# Patient Record
Sex: Male | Born: 1965 | Race: White | Hispanic: No | Marital: Single | Smoking: Current every day smoker
Health system: Southern US, Community
[De-identification: ages and names within clinical notes are randomized; demographics above are authoritative.]

## PROBLEM LIST (undated history)

## (undated) DIAGNOSIS — J189 Pneumonia, unspecified organism: Secondary | ICD-10-CM

## (undated) DIAGNOSIS — Z789 Other specified health status: Secondary | ICD-10-CM

## (undated) DIAGNOSIS — K219 Gastro-esophageal reflux disease without esophagitis: Secondary | ICD-10-CM

## (undated) DIAGNOSIS — J45909 Unspecified asthma, uncomplicated: Secondary | ICD-10-CM

## (undated) DIAGNOSIS — M5412 Radiculopathy, cervical region: Secondary | ICD-10-CM

## (undated) DIAGNOSIS — F1090 Alcohol use, unspecified, uncomplicated: Secondary | ICD-10-CM

## (undated) DIAGNOSIS — G8929 Other chronic pain: Secondary | ICD-10-CM

## (undated) DIAGNOSIS — L409 Psoriasis, unspecified: Secondary | ICD-10-CM

## (undated) DIAGNOSIS — IMO0001 Reserved for inherently not codable concepts without codable children: Secondary | ICD-10-CM

## (undated) DIAGNOSIS — I10 Essential (primary) hypertension: Secondary | ICD-10-CM

## (undated) DIAGNOSIS — M542 Cervicalgia: Secondary | ICD-10-CM

## (undated) DIAGNOSIS — Z7289 Other problems related to lifestyle: Secondary | ICD-10-CM

## (undated) DIAGNOSIS — J449 Chronic obstructive pulmonary disease, unspecified: Secondary | ICD-10-CM

## (undated) DIAGNOSIS — M2550 Pain in unspecified joint: Secondary | ICD-10-CM

## (undated) HISTORY — PX: MULTIPLE TOOTH EXTRACTIONS: SHX2053

## (undated) HISTORY — PX: HAND TENDON SURGERY: SHX663

## (undated) HISTORY — PX: THUMB AMPUTATION: SHX804

---

## 2000-08-27 ENCOUNTER — Emergency Department (HOSPITAL_COMMUNITY): Admission: EM | Admit: 2000-08-27 | Discharge: 2000-08-27 | Payer: Self-pay | Admitting: Internal Medicine

## 2000-08-27 ENCOUNTER — Encounter: Payer: Self-pay | Admitting: Internal Medicine

## 2001-10-09 ENCOUNTER — Emergency Department (HOSPITAL_COMMUNITY): Admission: EM | Admit: 2001-10-09 | Discharge: 2001-10-09 | Payer: Self-pay | Admitting: Emergency Medicine

## 2001-10-09 ENCOUNTER — Encounter: Payer: Self-pay | Admitting: Emergency Medicine

## 2002-01-10 ENCOUNTER — Ambulatory Visit (HOSPITAL_COMMUNITY): Admission: RE | Admit: 2002-01-10 | Discharge: 2002-01-10 | Payer: Self-pay | Admitting: Pulmonary Disease

## 2002-05-04 ENCOUNTER — Emergency Department (HOSPITAL_COMMUNITY): Admission: EM | Admit: 2002-05-04 | Discharge: 2002-05-04 | Payer: Self-pay | Admitting: Emergency Medicine

## 2002-07-28 ENCOUNTER — Emergency Department (HOSPITAL_COMMUNITY): Admission: EM | Admit: 2002-07-28 | Discharge: 2002-07-28 | Payer: Self-pay | Admitting: Emergency Medicine

## 2002-08-27 ENCOUNTER — Emergency Department (HOSPITAL_COMMUNITY): Admission: EM | Admit: 2002-08-27 | Discharge: 2002-08-27 | Payer: Self-pay | Admitting: Emergency Medicine

## 2004-01-07 ENCOUNTER — Emergency Department (HOSPITAL_COMMUNITY): Admission: EM | Admit: 2004-01-07 | Discharge: 2004-01-07 | Payer: Self-pay | Admitting: Emergency Medicine

## 2004-08-01 ENCOUNTER — Emergency Department (HOSPITAL_COMMUNITY): Admission: EM | Admit: 2004-08-01 | Discharge: 2004-08-01 | Payer: Self-pay | Admitting: Emergency Medicine

## 2005-09-11 ENCOUNTER — Emergency Department (HOSPITAL_COMMUNITY): Admission: EM | Admit: 2005-09-11 | Discharge: 2005-09-11 | Payer: Self-pay | Admitting: Emergency Medicine

## 2006-02-07 ENCOUNTER — Emergency Department (HOSPITAL_COMMUNITY): Admission: EM | Admit: 2006-02-07 | Discharge: 2006-02-07 | Payer: Self-pay | Admitting: Emergency Medicine

## 2006-03-19 ENCOUNTER — Emergency Department (HOSPITAL_COMMUNITY): Admission: EM | Admit: 2006-03-19 | Discharge: 2006-03-19 | Payer: Self-pay | Admitting: Emergency Medicine

## 2006-12-24 ENCOUNTER — Emergency Department (HOSPITAL_COMMUNITY): Admission: EM | Admit: 2006-12-24 | Discharge: 2006-12-24 | Payer: Self-pay | Admitting: Emergency Medicine

## 2009-03-16 ENCOUNTER — Emergency Department (HOSPITAL_COMMUNITY): Admission: EM | Admit: 2009-03-16 | Discharge: 2009-03-16 | Payer: Self-pay | Admitting: Emergency Medicine

## 2009-07-21 ENCOUNTER — Emergency Department (HOSPITAL_COMMUNITY): Admission: EM | Admit: 2009-07-21 | Discharge: 2009-07-21 | Payer: Self-pay | Admitting: Emergency Medicine

## 2010-09-25 ENCOUNTER — Emergency Department (HOSPITAL_COMMUNITY): Payer: Self-pay

## 2010-09-25 ENCOUNTER — Encounter: Payer: Self-pay | Admitting: Emergency Medicine

## 2010-09-25 ENCOUNTER — Emergency Department (HOSPITAL_COMMUNITY)
Admission: EM | Admit: 2010-09-25 | Discharge: 2010-09-25 | Disposition: A | Discharge status: Home / Self Care | Payer: Self-pay | Attending: Emergency Medicine | Admitting: Emergency Medicine

## 2010-09-25 DIAGNOSIS — F17200 Nicotine dependence, unspecified, uncomplicated: Secondary | ICD-10-CM | POA: Insufficient documentation

## 2010-09-25 DIAGNOSIS — S9030XA Contusion of unspecified foot, initial encounter: Secondary | ICD-10-CM | POA: Insufficient documentation

## 2010-09-25 DIAGNOSIS — IMO0002 Reserved for concepts with insufficient information to code with codable children: Secondary | ICD-10-CM

## 2010-09-25 DIAGNOSIS — W208XXA Other cause of strike by thrown, projected or falling object, initial encounter: Secondary | ICD-10-CM | POA: Insufficient documentation

## 2010-09-25 DIAGNOSIS — Y92009 Unspecified place in unspecified non-institutional (private) residence as the place of occurrence of the external cause: Secondary | ICD-10-CM | POA: Insufficient documentation

## 2010-09-25 NOTE — ED Notes (Signed)
Patient with no complaints at this time. Respirations even and unlabored. Skin warm/dry. Discharge instructions reviewed with patient at this time. Patient given opportunity to voice concerns/ask questions. Patient discharged at this time and left Emergency Department with steady gait.   

## 2010-09-25 NOTE — ED Notes (Signed)
Patient reports injury to left foot. Patient reports a TV fell on his foot. Swelling and bruising noted to left anterior foot. PMS intact. Cap refill <3 seconds.

## 2010-09-25 NOTE — ED Notes (Signed)
Patient reports taking 800 mg of ibuprofen 0900 today for pain relief.

## 2010-09-25 NOTE — ED Provider Notes (Signed)
History     CSN: [id number] Arrival date & time: 09/25/2010 12:19 PM  Chief Complaint  Patient presents with  . Foot Injury   Patient is a 45 y.o. male presenting with foot injury. The history is provided by the patient. No language interpreter was used.  Foot Injury  The incident occurred yesterday. The incident occurred at home (helping a friend move a TV and it dropped on his foot ). The injury mechanism was a direct blow. The pain is present in the left foot. The pain is at a severity of 6/10.    History reviewed. No pertinent past medical history.  Past Surgical History  Procedure Date  . Hand tendon surgery   . Thumb amputation     Partial     History reviewed. No pertinent family history.  History  Substance Use Topics  . Smoking status: Current Everyday Smoker -- 3.0 packs/day    Types: Cigarettes  . Smokeless tobacco: Not on file  . Alcohol Use: 10.8 oz/week    18 Cans of beer per week      Review of Systems  Musculoskeletal: Back pain: trauma.       L foot injury  All other systems reviewed and are negative.    Physical Exam  BP 157/108  Pulse 76  Temp(Src) 97.8 F (36.6 C) (Oral)  Resp 16  Ht 5\' 6"  (1.676 m)  Wt 145 lb (65.772 kg)  BMI 23.40 kg/m2  SpO2 98%  Physical Exam  Nursing note and vitals reviewed. Constitutional: He is oriented to person, place, and time. Vital signs are normal. He appears well-developed and well-nourished. No distress.  HENT:  Head: Normocephalic and atraumatic.  Right Ear: External ear normal.  Left Ear: External ear normal.  Nose: Nose normal.  Mouth/Throat: No oropharyngeal exudate.  Eyes: Conjunctivae and EOM are normal. Pupils are equal, round, and reactive to light. Right eye exhibits no discharge. Left eye exhibits no discharge. No scleral icterus.  Neck: Normal range of motion. Neck supple. No JVD present. No tracheal deviation present. No thyromegaly present.  Cardiovascular: Normal rate, regular rhythm,  normal heart sounds, intact distal pulses and normal pulses.  Exam reveals no gallop and no friction rub.   No murmur heard. Pulmonary/Chest: Effort normal and breath sounds normal. No stridor. No respiratory distress. He has no wheezes. He has no rales. He exhibits no tenderness.  Abdominal: Soft. Normal appearance and bowel sounds are normal. He exhibits no distension and no mass. There is no tenderness. There is no rebound and no guarding.  Musculoskeletal: Normal range of motion. He exhibits no edema and no tenderness.       Feet:  Lymphadenopathy:    He has no cervical adenopathy.  Neurological: He is alert and oriented to person, place, and time. He has normal reflexes. No cranial nerve deficit. Coordination normal. GCS eye subscore is 4. GCS verbal subscore is 5. GCS motor subscore is 6.  Reflex Scores:      Tricep reflexes are 2+ on the right side and 2+ on the left side.      Bicep reflexes are 2+ on the right side and 2+ on the left side.      Brachioradialis reflexes are 2+ on the right side and 2+ on the left side.      Patellar reflexes are 2+ on the right side and 2+ on the left side.      Achilles reflexes are 2+ on the right side and 2+ on  the left side. Skin: Skin is warm and dry. No rash noted. He is not diaphoretic.  Psychiatric: He has a normal mood and affect. His speech is normal and behavior is normal. Judgment and thought content normal. Cognition and memory are normal.    ED Course  Procedures  MDM       Worthy Rancher, PA 09/25/10 1511  Worthy Rancher, PA 09/25/10 1512  Worthy Rancher, PA 09/25/10 1519  Worthy Rancher, PA 09/25/10 1519  Worthy Rancher, PA 09/25/10 1524

## 2010-09-28 NOTE — ED Provider Notes (Signed)
Medical screening examination/treatment/procedure(s) were performed by non-physician practitioner and as supervising physician I was immediately available for consultation/collaboration.   Geoffery Lyons, MD 09/28/10 [phone]

## 2011-05-02 ENCOUNTER — Emergency Department (HOSPITAL_COMMUNITY): Payer: Worker's Compensation

## 2011-05-02 ENCOUNTER — Emergency Department (HOSPITAL_COMMUNITY)
Admission: EM | Admit: 2011-05-02 | Discharge: 2011-05-02 | Disposition: A | Discharge status: Home / Self Care | Payer: Worker's Compensation | Attending: Emergency Medicine | Admitting: Emergency Medicine

## 2011-05-02 ENCOUNTER — Encounter (HOSPITAL_COMMUNITY): Payer: Self-pay | Admitting: *Deleted

## 2011-05-02 DIAGNOSIS — R079 Chest pain, unspecified: Secondary | ICD-10-CM | POA: Insufficient documentation

## 2011-05-02 DIAGNOSIS — S20219A Contusion of unspecified front wall of thorax, initial encounter: Secondary | ICD-10-CM | POA: Insufficient documentation

## 2011-05-02 DIAGNOSIS — W19XXXA Unspecified fall, initial encounter: Secondary | ICD-10-CM | POA: Insufficient documentation

## 2011-05-02 MED ORDER — OXYCODONE-ACETAMINOPHEN 5-325 MG PO TABS: 1.0000 | ORAL_TABLET | Freq: Four times a day (QID) | ORAL | Status: AC | PRN

## 2011-05-02 MED ORDER — OXYCODONE-ACETAMINOPHEN 5-325 MG PO TABS
ORAL_TABLET | ORAL | Status: AC
  Filled 2011-05-02: qty 1

## 2011-05-02 MED ORDER — OXYCODONE-ACETAMINOPHEN 5-325 MG PO TABS
2.0000 | ORAL_TABLET | Freq: Once | ORAL | Status: AC
  Administered 2011-05-02: 2 via ORAL
  Filled 2011-05-02: qty 1

## 2011-05-02 MED ORDER — NAPROXEN 500 MG PO TABS: 500.0000 mg | ORAL_TABLET | Freq: Two times a day (BID) | ORAL | Status: DC

## 2011-05-02 MED ORDER — IBUPROFEN 800 MG PO TABS
800.0000 mg | ORAL_TABLET | Freq: Once | ORAL | Status: AC
  Administered 2011-05-02: 800 mg via ORAL
  Filled 2011-05-02: qty 1

## 2011-05-02 NOTE — ED Notes (Signed)
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

## 2011-05-02 NOTE — Discharge Instructions (Signed)
Chest Contusion You have been checked for injuries to your chest. Your caregiver has not found injuries serious enough to require hospitalization. It is common to have bruises and sore muscles after an injury. These tend to feel worse the first 24 hours. You may gradually develop more stiffness and soreness over the next several hours to several days. This usually feels worse the first morning following your injury. After a few days, you will usually begin to improve. The amount of improvement depends on the amount of damage. Following the accident, if the pain in any area continues to increase or you develop new areas of pain, you should see your primary caregiver or return to the Emergency Department for re-evaluation. HOME CARE INSTRUCTIONS   Put ice on sore areas every 2 hours for 20 minutes while awake for the next 2 days.   Drink extra fluids. Do not drink alcohol.   Activity as tolerated. Lifting may make pain worse.   Only take over-the-counter or prescription medicines for pain, discomfort, or fever as directed by your caregiver. Do not use aspirin. This may increase bruising or increase bleeding.  SEEK IMMEDIATE MEDICAL CARE IF:   There is a worsening of any of the problems that brought you in for care.   Shortness of breath, dizziness or fainting develop.   You have chest pain, difficulty breathing, or develop pain going down the left arm or up into jaw.   You feel sick to your stomach (nausea), vomiting or sweats.   You have increasing belly (abdominal) discomfort.   There is blood in your urine, stool, or if you vomit blood.   There is pain in either shoulder in an area where a shoulder strap would be.   You have feelings of lightheadedness, or if you should have a fainting episode.   You have numbness, tingling, weakness, or problems with the use of your arms or legs.   Severe headaches not relieved with medications develop.   You have a change in bowel or bladder  control.   There is increasing pain in any areas of the body.  If you feel your symptoms are worsening, and you are not able to see your primary caregiver, return to the Emergency Department immediately. MAKE SURE YOU:   Understand these instructions.   Will watch your condition.   Will get help right away if you are not doing well or get worse.  Document Released: 11/02/2000 Document Revised: 01/27/2011 Document Reviewed: 09/26/2007 [employer] Patient Information 2012 [employer]. 

## 2011-05-02 NOTE — ED Notes (Signed)
Reports fell at work on Friday landing on right side.  C/o right sided rib pain, c/o increased pain with deep breath.

## 2011-05-02 NOTE — ED Provider Notes (Signed)
History   This chart was scribed for Shane Bailiff, MD by Clarita Crane. The patient was seen in room APA09/APA09. Patient's care was started at 0828.    CSN: [id number]  Arrival date & time 05/02/11  1191   First MD Initiated Contact with Patient 05/02/11 [phone]      Chief Complaint  Patient presents with  . Fall    (Consider location/radiation/quality/duration/timing/severity/associated sxs/prior treatment) HPI Shane Allison is a 46 y.o. male who presents to the Emergency Department complaining of constant moderate to severe right lateral rib pain onset 3 days ago after sustaining a fall while at work in which he landed on his right side/ribs and persistent since. States pain is aggravated with deep breathing. Denies fever, nausea, vomiting, SOB, cough. Patient is a current everyday smoker.   History reviewed. No pertinent past medical history.  Past Surgical History  Procedure Date  . Hand tendon surgery   . Thumb amputation     Partial     No family history on file.  History  Substance Use Topics  . Smoking status: Current Everyday Smoker -- 3.0 packs/day    Types: Cigarettes  . Smokeless tobacco: Not on file  . Alcohol Use: 10.8 oz/week    18 Cans of beer per week      Review of Systems  Constitutional: Negative for fever and chills.  HENT: Negative for rhinorrhea and neck pain.   Eyes: Negative for pain.  Respiratory: Negative for cough and shortness of breath.        Right sided chest wall pain.   Cardiovascular: Negative for chest pain.  Gastrointestinal: Negative for nausea, vomiting, abdominal pain and diarrhea.  Genitourinary: Negative for dysuria.  Musculoskeletal: Negative for back pain.  Skin: Negative for rash.  Neurological: Negative for dizziness and weakness.    Allergies  Penicillins  Home Medications   Current Outpatient Rx  Name Route Sig Dispense Refill  . NAPROXEN 500 MG PO TABS Oral Take 1 tablet (500 mg total) by mouth 2 (two)  times daily. 30 tablet 0  . OXYCODONE-ACETAMINOPHEN 5-325 MG PO TABS Oral Take 1-2 tablets by mouth every 6 (six) hours as needed for pain. 20 tablet 0    BP 165/88  Pulse 91  Temp(Src) 98.1 F (36.7 C) (Oral)  Resp 16  Ht 5\' 6"  (1.676 m)  Wt 150 lb (68.04 kg)  BMI 24.21 kg/m2  SpO2 96%  Physical Exam  Nursing note and vitals reviewed. Constitutional: He is oriented to person, place, and time. He appears well-developed and well-nourished. No distress.  HENT:  Head: Normocephalic and atraumatic.  Eyes: EOM are normal. Pupils are equal, round, and reactive to light.  Neck: Neck supple. No tracheal deviation present.  Cardiovascular: Normal rate.   Pulmonary/Chest: Effort normal and breath sounds normal. No respiratory distress. He has no wheezes. He has no rales.       Right sided chest wall tender to palpation.   Abdominal: Soft. He exhibits no distension.  Musculoskeletal: Normal range of motion. He exhibits no edema.  Neurological: He is alert and oriented to person, place, and time. No sensory deficit.  Skin: Skin is warm and dry.  Psychiatric: He has a normal mood and affect. His behavior is normal.    ED Course  Procedures (including critical care time)  DIAGNOSTIC STUDIES: Oxygen Saturation is 96% on room air, adequate by my interpretation.    COORDINATION OF CARE: 9:23AM- Patient informed of current plan for treatment and evaluation and  agrees with plan at this time.     Labs Reviewed - No data to display Dg Ribs Unilateral W/chest Right  05/02/2011  *RADIOLOGY REPORT*  Clinical Data: Right anterior rib pain after fall.  RIGHT RIBS AND CHEST - 3+ VIEW  Comparison: Chest x-ray from 07/21/2009  Findings: Frontal chest shows clear lungs. The cardiopericardial silhouette is within normal limits for size.  Oblique views of the right ribs were obtained with a radiopaque BB localizing the area of the patient's concern.  No underlying rib fracture is evident.  IMPRESSION: No  acute cardiopulmonary findings.  No evidence for acute right rib fracture.  Incidental note made of right cervical rib.  Original Report Authenticated By: ERIC A. MANSELL, M.D.     1. Rib contusion       MDM  Imaging negative for rib fracture or pneumothorax. Patient was prescribed pain medication, anti-inflammatory medication. He was prescribed an incentive spirometer which he was instructed easily 3 times an hour to prevent atelectatic pneumonia. He is provided clear signs and symptoms for which returned emergency department.      I personally performed the services described in this documentation, which was scribed in my presence. The recorded information has been reviewed and considered.    Shane Bailiff, MD 05/02/11 [phone]

## 2012-01-22 ENCOUNTER — Emergency Department (HOSPITAL_COMMUNITY)
Admission: EM | Admit: 2012-01-22 | Discharge: 2012-01-22 | Disposition: A | Discharge status: Home / Self Care | Payer: Self-pay | Attending: Emergency Medicine | Admitting: Emergency Medicine

## 2012-01-22 ENCOUNTER — Emergency Department (HOSPITAL_COMMUNITY): Payer: Self-pay

## 2012-01-22 ENCOUNTER — Encounter (HOSPITAL_COMMUNITY): Payer: Self-pay

## 2012-01-22 DIAGNOSIS — IMO0001 Reserved for inherently not codable concepts without codable children: Secondary | ICD-10-CM | POA: Insufficient documentation

## 2012-01-22 DIAGNOSIS — Z872 Personal history of diseases of the skin and subcutaneous tissue: Secondary | ICD-10-CM | POA: Insufficient documentation

## 2012-01-22 DIAGNOSIS — R0789 Other chest pain: Secondary | ICD-10-CM | POA: Insufficient documentation

## 2012-01-22 DIAGNOSIS — J069 Acute upper respiratory infection, unspecified: Secondary | ICD-10-CM | POA: Insufficient documentation

## 2012-01-22 DIAGNOSIS — R062 Wheezing: Secondary | ICD-10-CM | POA: Insufficient documentation

## 2012-01-22 DIAGNOSIS — F17200 Nicotine dependence, unspecified, uncomplicated: Secondary | ICD-10-CM | POA: Insufficient documentation

## 2012-01-22 DIAGNOSIS — J3489 Other specified disorders of nose and nasal sinuses: Secondary | ICD-10-CM | POA: Insufficient documentation

## 2012-01-22 DIAGNOSIS — J40 Bronchitis, not specified as acute or chronic: Secondary | ICD-10-CM | POA: Insufficient documentation

## 2012-01-22 DIAGNOSIS — I10 Essential (primary) hypertension: Secondary | ICD-10-CM | POA: Insufficient documentation

## 2012-01-22 HISTORY — DX: Essential (primary) hypertension: I10

## 2012-01-22 HISTORY — DX: Psoriasis, unspecified: L40.9

## 2012-01-22 MED ORDER — PREDNISONE 50 MG PO TABS
60.0000 mg | ORAL_TABLET | Freq: Once | ORAL | Status: AC
  Administered 2012-01-22: 60 mg via ORAL
  Filled 2012-01-22: qty 1

## 2012-01-22 MED ORDER — ALBUTEROL SULFATE (5 MG/ML) 0.5% IN NEBU
5.0000 mg | INHALATION_SOLUTION | Freq: Once | RESPIRATORY_TRACT | Status: AC
  Administered 2012-01-22: 5 mg via RESPIRATORY_TRACT
  Filled 2012-01-22: qty 1

## 2012-01-22 MED ORDER — AZITHROMYCIN 250 MG PO TABS: ORAL_TABLET | ORAL | Status: DC

## 2012-01-22 MED ORDER — GUAIFENESIN-CODEINE 100-10 MG/5ML PO SYRP: 10.0000 mL | ORAL_SOLUTION | Freq: Three times a day (TID) | ORAL | Status: AC | PRN

## 2012-01-22 MED ORDER — AZITHROMYCIN 250 MG PO TABS
500.0000 mg | ORAL_TABLET | Freq: Once | ORAL | Status: AC
  Administered 2012-01-22: 500 mg via ORAL
  Filled 2012-01-22: qty 2

## 2012-01-22 MED ORDER — IPRATROPIUM BROMIDE 0.02 % IN SOLN
0.5000 mg | Freq: Once | RESPIRATORY_TRACT | Status: AC
  Administered 2012-01-22: 0.5 mg via RESPIRATORY_TRACT
  Filled 2012-01-22: qty 2.5

## 2012-01-22 MED ORDER — ALBUTEROL SULFATE HFA 108 (90 BASE) MCG/ACT IN AERS
2.0000 | INHALATION_SPRAY | Freq: Once | RESPIRATORY_TRACT | Status: AC
  Administered 2012-01-22: 2 via RESPIRATORY_TRACT
  Filled 2012-01-22: qty 6.7

## 2012-01-22 MED ORDER — PREDNISONE 20 MG PO TABS: ORAL_TABLET | ORAL | Status: DC

## 2012-01-22 NOTE — ED Notes (Signed)
Pt c/o productive cough with green sputum since Monday.  Denies any fever.

## 2012-01-22 NOTE — ED Notes (Signed)
Pt c/o productive cough and rib pain x1 week. Pt states mucus is green.

## 2012-01-22 NOTE — ED Provider Notes (Signed)
History     CSN: [id number]  Arrival date & time 01/22/12  1330   First MD Initiated Contact with Patient 01/22/12 1447      Chief Complaint  Patient presents with  . URI    (Consider location/radiation/quality/duration/timing/severity/associated sxs/prior treatment) HPI Comments: Patient c/o persistent productive cough for one week.  States the cough is productive of yellow to green sputum.  Also c/o chest tightness with cough and deep breathing.  He also c/o myalgias and nasal congestion Patient is a smoker and states he has had pneumonia multiple times in the past.  He denies fever, vomiting, shortness of breath, or abdominal pain.  He denies any OTC medications  Patient is a 46 y.o. male presenting with cough. The history is provided by the patient.  Cough This is a new problem. The current episode started more than 1 week ago. The problem occurs constantly. The problem has not changed since onset.The cough is productive of purulent sputum. There has been no fever. Associated symptoms include rhinorrhea, myalgias and wheezing. Pertinent negatives include no chest pain, no chills, no sweats, no headaches, no sore throat and no shortness of breath. He has tried nothing for the symptoms. The treatment provided no relief. He is a smoker. His past medical history is significant for pneumonia. His past medical history does not include COPD.    Past Medical History  Diagnosis Date  . Hypertension   . Psoriasis     Past Surgical History  Procedure Date  . Hand tendon surgery   . Thumb amputation     Partial     No family history on file.  History  Substance Use Topics  . Smoking status: Current Every Day Smoker -- 3.0 packs/day    Types: Cigarettes  . Smokeless tobacco: Not on file  . Alcohol Use: 0.0 oz/week     Comment: 6 pack/ day-  Last intake was last night.      Review of Systems  Constitutional: Negative for fever, chills, activity change and appetite change.    HENT: Positive for congestion and rhinorrhea. Negative for sore throat, facial swelling, trouble swallowing, neck pain and neck stiffness.   Eyes: Negative for visual disturbance.  Respiratory: Positive for cough, chest tightness and wheezing. Negative for apnea, shortness of breath and stridor.   Cardiovascular: Negative for chest pain.  Gastrointestinal: Negative for nausea, vomiting and abdominal pain.  Musculoskeletal: Positive for myalgias. Negative for arthralgias and gait problem.  Skin: Negative.  Negative for color change and rash.  Neurological: Negative for dizziness, facial asymmetry, weakness, numbness and headaches.  Hematological: Negative for adenopathy.  Psychiatric/Behavioral: Negative for confusion.  All other systems reviewed and are negative.    Allergies  Penicillins  Home Medications  No current outpatient prescriptions on file.  BP 165/98  Pulse 108  Temp 98.8 F (37.1 C) (Oral)  Resp 20  Ht 5\' 7"  (1.702 m)  Wt 145 lb (65.772 kg)  BMI 22.71 kg/m2  SpO2 98%  Physical Exam  Nursing note and vitals reviewed. Constitutional: He is oriented to person, place, and time. He appears well-developed and well-nourished. No distress.  HENT:  Head: Normocephalic and atraumatic.  Right Ear: Tympanic membrane and ear canal normal.  Left Ear: Tympanic membrane and ear canal normal.  Mouth/Throat: Uvula is midline, oropharynx is clear and moist and mucous membranes are normal. No oropharyngeal exudate.  Eyes: EOM are normal. Pupils are equal, round, and reactive to light.  Neck: Normal range of motion. Neck  supple.  Cardiovascular: Normal rate, regular rhythm, normal heart sounds and intact distal pulses.   No murmur heard. Pulmonary/Chest: Effort normal. No respiratory distress. He has wheezes. He has no rales. He exhibits no tenderness.       Inspiratory and expiratory wheezing throughout.  No rales.  Abdominal: Soft. He exhibits no distension. There is no  tenderness. There is no rebound and no guarding.  Musculoskeletal: He exhibits no edema.  Lymphadenopathy:    He has no cervical adenopathy.  Neurological: He is alert and oriented to person, place, and time. He exhibits normal muscle tone. Coordination normal.  Skin: Skin is warm and dry.    ED Course  Procedures (including critical care time)  Labs Reviewed - No data to display Dg Chest 2 View  01/22/2012  *RADIOLOGY REPORT*  Clinical Data: Cough.  Respiratory infection  CHEST - 2 VIEW  Comparison: 05/02/2011  Findings: COPD with hyperinflation of the lungs.  Negative for pneumonia.  Negative for heart failure or effusion.  Lungs are clear.  No mass lesion.  Cervical rib on the right.  IMPRESSION: COPD without acute cardiopulmonary abnormality.   Original Report Authenticated By: Janeece Riggers, M.D.         MDM    Vitals stable, pt is non-toxic appearing, no tachypnea or hypoxia.  Likely bronchitis with findings c/w COPD on x-ray.   Will treat pt with prednisone, z-pack, cough syrup and dispensed albuterol inhaler form ED.  Pt agrees to f/u with pMD or return here in 2-3 days if the sx's are not improving      Macari Zalesky L. [place], [place] 01/23/12 2100

## 2012-01-24 NOTE — ED Provider Notes (Signed)
Medical screening examination/treatment/procedure(s) were performed by non-physician practitioner and as supervising physician I was immediately available for consultation/collaboration.   Moni Rothrock, MD 01/24/12 1647 

## 2012-01-25 ENCOUNTER — Encounter (HOSPITAL_COMMUNITY): Payer: Self-pay | Admitting: *Deleted

## 2012-01-25 ENCOUNTER — Emergency Department (HOSPITAL_COMMUNITY)
Admission: EM | Admit: 2012-01-25 | Discharge: 2012-01-25 | Disposition: A | Discharge status: Home / Self Care | Payer: Self-pay | Attending: Emergency Medicine | Admitting: Emergency Medicine

## 2012-01-25 DIAGNOSIS — J209 Acute bronchitis, unspecified: Secondary | ICD-10-CM | POA: Insufficient documentation

## 2012-01-25 DIAGNOSIS — J029 Acute pharyngitis, unspecified: Secondary | ICD-10-CM | POA: Insufficient documentation

## 2012-01-25 DIAGNOSIS — Z79899 Other long term (current) drug therapy: Secondary | ICD-10-CM | POA: Insufficient documentation

## 2012-01-25 DIAGNOSIS — F17200 Nicotine dependence, unspecified, uncomplicated: Secondary | ICD-10-CM | POA: Insufficient documentation

## 2012-01-25 DIAGNOSIS — R062 Wheezing: Secondary | ICD-10-CM | POA: Insufficient documentation

## 2012-01-25 DIAGNOSIS — R0602 Shortness of breath: Secondary | ICD-10-CM | POA: Insufficient documentation

## 2012-01-25 DIAGNOSIS — R059 Cough, unspecified: Secondary | ICD-10-CM | POA: Insufficient documentation

## 2012-01-25 DIAGNOSIS — R0789 Other chest pain: Secondary | ICD-10-CM | POA: Insufficient documentation

## 2012-01-25 DIAGNOSIS — R05 Cough: Secondary | ICD-10-CM | POA: Insufficient documentation

## 2012-01-25 DIAGNOSIS — Z760 Encounter for issue of repeat prescription: Secondary | ICD-10-CM | POA: Insufficient documentation

## 2012-01-25 DIAGNOSIS — I10 Essential (primary) hypertension: Secondary | ICD-10-CM | POA: Insufficient documentation

## 2012-01-25 MED ORDER — AZITHROMYCIN 250 MG PO TABS
250.0000 mg | ORAL_TABLET | Freq: Once | ORAL | Status: AC
  Administered 2012-01-25: 250 mg via ORAL
  Filled 2012-01-25: qty 1

## 2012-01-25 MED ORDER — IPRATROPIUM BROMIDE 0.02 % IN SOLN
0.5000 mg | Freq: Once | RESPIRATORY_TRACT | Status: AC
  Administered 2012-01-25: 0.5 mg via RESPIRATORY_TRACT
  Filled 2012-01-25: qty 2.5

## 2012-01-25 MED ORDER — ALBUTEROL SULFATE (5 MG/ML) 0.5% IN NEBU
5.0000 mg | INHALATION_SOLUTION | Freq: Once | RESPIRATORY_TRACT | Status: AC
  Administered 2012-01-25: 5 mg via RESPIRATORY_TRACT
  Filled 2012-01-25: qty 1

## 2012-01-25 MED ORDER — QUINAPRIL-HYDROCHLOROTHIAZIDE 20-25 MG PO TABS: 1.0000 | ORAL_TABLET | Freq: Every day | ORAL | Status: DC

## 2012-01-25 MED ORDER — BENZONATATE 100 MG PO CAPS
200.0000 mg | ORAL_CAPSULE | Freq: Once | ORAL | Status: AC
  Administered 2012-01-25: 200 mg via ORAL
  Filled 2012-01-25: qty 2

## 2012-01-25 MED ORDER — BENZONATATE 100 MG PO CAPS: 100.0000 mg | ORAL_CAPSULE | Freq: Three times a day (TID) | ORAL | Status: DC | PRN

## 2012-01-25 NOTE — ED Provider Notes (Signed)
History     CSN: [id number]  Arrival date & time 01/25/12  1534   First MD Initiated Contact with Patient 01/25/12 1609      Chief Complaint  Patient presents with  . flu sx     (Consider location/radiation/quality/duration/timing/severity/associated sxs/prior treatment) HPI Comments: Shane Allison presents for re-evaluation of his cough,  Wheezing and now with complaint of hoarseness and sore throat which he feels is secondary to all of his coughing.  His cough was productive of green to yellow sputum which has dried up since he was placed on zithromax 3 days ago.  He took his last tablet today,  As he accidentally took a double dose 2 days ago despite being his first double dose here 3 days ago prior to dispo home,  Is therefore now one tablet short. He still has 4 days of prednisone taper to take.   He also reports having peripheral edema which he noticed yesterday after standing all day at work.  He is followed by the health dept for his blood pressure and ran out of his combo bp/diuretic medicine one week ago.  He does not recall the name.  He denies chest pain, palpitations and fevers.  He has continued to smoke cigarettes.  He denies fevers and chills, has had no chest pain,  Dizziness or weakness, although feels fatigued.  The history is provided by the patient.    Past Medical History  Diagnosis Date  . Hypertension   . Psoriasis     Past Surgical History  Procedure Date  . Hand tendon surgery   . Thumb amputation     Partial     No family history on file.  History  Substance Use Topics  . Smoking status: Current Every Day Smoker -- 3.0 packs/day    Types: Cigarettes  . Smokeless tobacco: Not on file  . Alcohol Use: 0.0 oz/week     Comment: 6 pack/ day-  Last intake was 01/21/12      Review of Systems  Constitutional: Negative for fever.  HENT: Positive for sore throat. Negative for congestion and neck pain.   Eyes: Negative.   Respiratory: Positive for  cough, chest tightness, shortness of breath and wheezing. Negative for stridor.   Cardiovascular: Negative for chest pain.  Gastrointestinal: Negative for nausea and abdominal pain.  Genitourinary: Negative.   Musculoskeletal: Negative for joint swelling and arthralgias.  Skin: Negative.  Negative for rash and wound.  Neurological: Negative for dizziness, weakness, light-headedness, numbness and headaches.  Hematological: Negative.   Psychiatric/Behavioral: Negative.     Allergies  Penicillins  Home Medications   Current Outpatient Rx  Name  Route  Sig  Dispense  Refill  . ALBUTEROL SULFATE HFA 108 (90 BASE) MCG/ACT IN AERS   Inhalation   Inhale 2 puffs into the lungs every 6 (six) hours as needed. Shortness of Breath         . VITAMIN C PO   Oral   Take 1 packet by mouth daily.         . AZITHROMYCIN 250 MG PO TABS   Oral   Take 250 mg by mouth daily. Take 2 tablets the first day then 1 tablet on days 2-5.         Marland Kitchen DIPHENHYDRAMINE-PE-APAP 12.5-5-325 MG/15ML PO LIQD   Oral   Take 15 mLs by mouth at bedtime as needed. Cold Symptoms         . GUAIFENESIN-CODEINE 100-10 MG/5ML PO SYRP  Oral   Take 10 mLs by mouth 3 (three) times daily as needed for cough.   120 mL   0   . IBUPROFEN 200 MG PO TABS   Oral   Take 200 mg by mouth every 6 (six) hours as needed. Pain         . PREDNISONE 20 MG PO TABS   Oral   Take 10 mg by mouth daily. Take 6 tablets po qd x 2 days, then 4 tablets po qd x 2 days, then 2 tablet po qd x 2 days         . QUINAPRIL-HYDROCHLOROTHIAZIDE 20-25 MG PO TABS   Oral   Take 1 tablet by mouth daily.         Marland Kitchen BENZONATATE 100 MG PO CAPS   Oral   Take 1 capsule (100 mg total) by mouth 3 (three) times daily as needed for cough.   21 capsule   0   . QUINAPRIL-HYDROCHLOROTHIAZIDE 20-25 MG PO TABS   Oral   Take 1 tablet by mouth daily.   30 tablet   0     BP 148/100  Pulse 91  Temp 98.3 F (36.8 C) (Oral)  Resp 20  Ht 5\' 7"   (1.702 m)  Wt 145 lb (65.772 kg)  BMI 22.71 kg/m2  SpO2 96%  Physical Exam  Nursing note and vitals reviewed. Constitutional: He appears well-developed and well-nourished.  HENT:  Head: Normocephalic and atraumatic.  Eyes: Conjunctivae normal are normal.  Neck: Normal range of motion.  Cardiovascular: Normal rate, regular rhythm, normal heart sounds and intact distal pulses.   Pulmonary/Chest: Effort normal. He has wheezes. He has no rales. He exhibits no tenderness.       Bilateral expiratory wheeze.  Prolonged expirations.  Abdominal: Soft. Bowel sounds are normal. There is no tenderness.  Musculoskeletal: Normal range of motion. He exhibits no edema and no tenderness.  Neurological: He is alert.  Skin: Skin is warm and dry.  Psychiatric: He has a normal mood and affect.    ED Course  Procedures (including critical care time)  Labs Reviewed - No data to display No results found.   1. Bronchitis, acute, with bronchospasm   2. Medication refill     Pt given albuterol/ atrovent neb with improvement in wheezing,  Improved aeration.  MDM  Call to [facility] dept - pt should be taking accuretic 20/25.  Prescription given.  Pt is scheduled to see his pcp in 6 days.  He was given one zithromax tablet to take tomorrow to complete his z pack prescription.  Prescribed tessalon for cough suppression.  He does have an albuterol inhaler and was encouraged to continue this tx.          Burgess Amor, PA 01/26/12 0032  Burgess Amor, PA 01/26/12 [phone]

## 2012-01-25 NOTE — ED Notes (Signed)
Seen here on Sunday and dx with COPD, bronchitis, and flu.  Reports is not getting better.  States completed prednisone and abx.

## 2012-01-27 NOTE — ED Provider Notes (Signed)
Medical screening examination/treatment/procedure(s) were performed by non-physician practitioner and as supervising physician I was immediately available for consultation/collaboration.   Joya Gaskins, MD 01/27/12 [phone]

## 2012-05-30 ENCOUNTER — Emergency Department (HOSPITAL_COMMUNITY)
Admission: EM | Admit: 2012-05-30 | Discharge: 2012-05-30 | Disposition: A | Discharge status: Home / Self Care | Payer: Self-pay | Attending: Emergency Medicine | Admitting: Emergency Medicine

## 2012-05-30 ENCOUNTER — Encounter (HOSPITAL_COMMUNITY): Payer: Self-pay | Admitting: *Deleted

## 2012-05-30 ENCOUNTER — Emergency Department (HOSPITAL_COMMUNITY): Payer: Self-pay

## 2012-05-30 DIAGNOSIS — Y9301 Activity, walking, marching and hiking: Secondary | ICD-10-CM | POA: Insufficient documentation

## 2012-05-30 DIAGNOSIS — S3981XA Other specified injuries of abdomen, initial encounter: Secondary | ICD-10-CM | POA: Insufficient documentation

## 2012-05-30 DIAGNOSIS — S20211A Contusion of right front wall of thorax, initial encounter: Secondary | ICD-10-CM

## 2012-05-30 DIAGNOSIS — Z79899 Other long term (current) drug therapy: Secondary | ICD-10-CM | POA: Insufficient documentation

## 2012-05-30 DIAGNOSIS — Z872 Personal history of diseases of the skin and subcutaneous tissue: Secondary | ICD-10-CM | POA: Insufficient documentation

## 2012-05-30 DIAGNOSIS — Y9289 Other specified places as the place of occurrence of the external cause: Secondary | ICD-10-CM | POA: Insufficient documentation

## 2012-05-30 DIAGNOSIS — S20219A Contusion of unspecified front wall of thorax, initial encounter: Secondary | ICD-10-CM | POA: Insufficient documentation

## 2012-05-30 DIAGNOSIS — W108XXA Fall (on) (from) other stairs and steps, initial encounter: Secondary | ICD-10-CM | POA: Insufficient documentation

## 2012-05-30 DIAGNOSIS — I10 Essential (primary) hypertension: Secondary | ICD-10-CM | POA: Insufficient documentation

## 2012-05-30 DIAGNOSIS — F17200 Nicotine dependence, unspecified, uncomplicated: Secondary | ICD-10-CM | POA: Insufficient documentation

## 2012-05-30 LAB — URINALYSIS, ROUTINE W REFLEX MICROSCOPIC
Bilirubin Urine: NEGATIVE
Glucose, UA: NEGATIVE mg/dL
Hgb urine dipstick: NEGATIVE
Ketones, ur: NEGATIVE mg/dL
Leukocytes, UA: NEGATIVE
Nitrite: NEGATIVE
Protein, ur: NEGATIVE mg/dL
Specific Gravity, Urine: 1.01 (ref 1.005–1.030)
Urobilinogen, UA: 0.2 mg/dL (ref 0.0–1.0)
pH: 5.5 (ref 5.0–8.0)

## 2012-05-30 MED ORDER — OXYCODONE-ACETAMINOPHEN 5-325 MG PO TABS: 1.0000 | ORAL_TABLET | ORAL | Status: DC | PRN

## 2012-05-30 MED ORDER — OXYCODONE-ACETAMINOPHEN 5-325 MG PO TABS
1.0000 | ORAL_TABLET | Freq: Once | ORAL | Status: AC
Start: 2012-05-30 — End: 2012-05-30
  Administered 2012-05-30: 1 via ORAL
  Filled 2012-05-30: qty 1

## 2012-05-30 NOTE — ED Notes (Signed)
Pt states he slipped and fell down 3 steps last night, landing on the steps. Pain to left rib area. NAD.

## 2012-06-01 NOTE — ED Provider Notes (Signed)
History     CSN: [id number]  Arrival date & time 05/30/12  1300   First MD Initiated Contact with Patient 05/30/12 1335      Chief Complaint  Patient presents with  . Fall  . rib pain     (Consider location/radiation/quality/duration/timing/severity/associated sxs/prior treatment) HPI Comments: Patient c/o pain to his right posterior chest wall after a fall that occurred on the evening prior to ED arrival.  Patient reports falling down 3 steps , landing on his side.  Pain is worse with deep breathing and certain movements, improves with rest.  He denies shortness of breath, hematuria, or abdominal pain.  He also denies head injury or LOC  Patient is a 47 y.o. male presenting with fall. The history is provided by the patient.  Fall The accident occurred yesterday. The fall occurred while walking. He landed on a hard floor. There was no blood loss. Point of impact: right chest. Pain location: right chest and flank. The pain is moderate. He was ambulatory at the scene. There was no entrapment after the fall. There was no drug use involved in the accident. There was no alcohol use involved in the accident. Pertinent negatives include no visual change, no fever, no numbness, no abdominal pain, no bowel incontinence, no nausea, no vomiting, no hematuria, no headaches, no hearing loss, no loss of consciousness and no tingling. The symptoms are aggravated by activity, standing, flexion, ambulation, rotation and sitting. He has tried NSAIDs for the symptoms. The treatment provided no relief.    Past Medical History  Diagnosis Date  . Hypertension   . Psoriasis     Past Surgical History  Procedure Laterality Date  . Hand tendon surgery    . Thumb amputation      Partial     No family history on file.  History  Substance Use Topics  . Smoking status: Current Every Day Smoker -- 3.00 packs/day    Types: Cigarettes  . Smokeless tobacco: Not on file  . Alcohol Use: 0.0 oz/week   Comment: 6 pack/ day-  Last intake was 01/21/12      Review of Systems  Constitutional: Negative for fever and chills.  HENT: Negative for neck pain.   Eyes: Negative for visual disturbance.  Respiratory: Negative for chest tightness, shortness of breath, wheezing and stridor.   Cardiovascular: Positive for chest pain.  Gastrointestinal: Negative for nausea, vomiting, abdominal pain and bowel incontinence.  Genitourinary: Negative for dysuria, hematuria, flank pain and difficulty urinating.  Musculoskeletal: Positive for arthralgias. Negative for back pain and joint swelling.  Skin: Negative for color change and wound.  Neurological: Negative for dizziness, tingling, loss of consciousness, facial asymmetry, numbness and headaches.  All other systems reviewed and are negative.    Allergies  Penicillins  Home Medications   Current Outpatient Rx  Name  Route  Sig  Dispense  Refill  . albuterol (PROVENTIL HFA;VENTOLIN HFA) 108 (90 BASE) MCG/ACT inhaler   Inhalation   Inhale 2 puffs into the lungs every 6 (six) hours as needed. Shortness of Breath         . naproxen sodium (ALEVE) 220 MG tablet   Oral   Take 220 mg by mouth 2 (two) times daily as needed (pain).         . quinapril-hydrochlorothiazide (ACCURETIC) 20-25 MG per tablet   Oral   Take 1 tablet by mouth daily.         Marland Kitchen oxyCODONE-acetaminophen (PERCOCET/ROXICET) 5-325 MG per tablet  Oral   Take 1 tablet by mouth every 4 (four) hours as needed for pain.   20 tablet   0     BP 154/97  Pulse 105  Temp(Src) 98.7 F (37.1 C) (Oral)  Resp 18  Ht 5\' 7"  (1.702 m)  Wt 145 lb (65.772 kg)  BMI 22.71 kg/m2  SpO2 96%  Physical Exam  Constitutional: He is oriented to person, place, and time. He appears well-developed and well-nourished. No distress.  HENT:  Head: Normocephalic and atraumatic.  Neck: Normal range of motion. Neck supple.  Cardiovascular: Normal rate, regular rhythm, normal heart sounds and  intact distal pulses.   No murmur heard. Pulmonary/Chest: Effort normal and breath sounds normal. No respiratory distress. He has no decreased breath sounds. He has no wheezes. He has no rales.   He exhibits tenderness.  Localized ttp of right lateral and posterior chest wall .  No crepitus , guarding , edema or abrasions  Abdominal: Soft. He exhibits no distension and no mass. There is no tenderness. There is no rebound and no guarding.  Musculoskeletal: Normal range of motion.  Neurological: He is alert and oriented to person, place, and time. He exhibits normal muscle tone. Coordination normal.  Skin: Skin is warm and dry.    ED Course  Procedures (including critical care time)  Labs Reviewed  URINALYSIS, ROUTINE W REFLEX MICROSCOPIC   Results for orders placed during the hospital encounter of 05/30/12  URINALYSIS, ROUTINE W REFLEX MICROSCOPIC      Result Value Range   Color, Urine YELLOW  YELLOW   APPearance CLEAR  CLEAR   Specific Gravity, Urine 1.010  1.005 - 1.030   pH 5.5  5.0 - 8.0   Glucose, UA NEGATIVE  NEGATIVE mg/dL   Hgb urine dipstick NEGATIVE  NEGATIVE   Bilirubin Urine NEGATIVE  NEGATIVE   Ketones, ur NEGATIVE  NEGATIVE mg/dL   Protein, ur NEGATIVE  NEGATIVE mg/dL   Urobilinogen, UA 0.2  0.0 - 1.0 mg/dL   Nitrite NEGATIVE  NEGATIVE   Leukocytes, UA NEGATIVE  NEGATIVE     1. Contusion of ribs, right, initial encounter     Dg Ribs Unilateral W/chest Right  05/30/2012  *RADIOLOGY REPORT*  Clinical Data: Post fall, now with right anterior posterior rib pain  RIGHT RIBS AND CHEST - 3+ VIEW  Comparison: 01/22/2012; 05/02/2011  Findings:  Grossly unchanged cardiac silhouette and mediastinal contours.  The lungs appear mildly hyperexpanded with flattening of the bilateral hemidiaphragms and mild diffuse thickening of the pulmonary interstitium.  No focal airspace opacity.  No pleural effusion or pneumothorax.  No acute osseous abnormalities with special attention paid  to the right sided ribs.  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  IMPRESSION: Hyperexpanded lungs without acute cardiopulmonary disease. Specifically, no displaced right-sided rib fractures.   Original Report Authenticated By: Tacey Ruiz, MD     MDM    Patient is feeling better.  Likely contusion of the right ribs. Pt agrees to rest, ice, deep breathing and coughing several times a day and close f/u with his PMD for recheck next week.  He also agrees to return here if the sx's worsen.    Will prescribe percocet and pt to continue aleve   The patient appears reasonably screened and/or stabilized for discharge and I doubt any other medical condition or other [facility] requiring further screening, evaluation, or treatment in the ED at this time prior to discharge.  Nelma Phagan L. Trisha Mangle, PA-C 06/01/12 1730

## 2012-06-02 ENCOUNTER — Emergency Department (HOSPITAL_COMMUNITY)
Admission: EM | Admit: 2012-06-02 | Discharge: 2012-06-02 | Disposition: A | Discharge status: Home / Self Care | Payer: Self-pay | Attending: Emergency Medicine | Admitting: Emergency Medicine

## 2012-06-02 ENCOUNTER — Emergency Department (HOSPITAL_COMMUNITY): Payer: Self-pay

## 2012-06-02 ENCOUNTER — Encounter (HOSPITAL_COMMUNITY): Payer: Self-pay | Admitting: *Deleted

## 2012-06-02 DIAGNOSIS — W108XXA Fall (on) (from) other stairs and steps, initial encounter: Secondary | ICD-10-CM | POA: Insufficient documentation

## 2012-06-02 DIAGNOSIS — R0989 Other specified symptoms and signs involving the circulatory and respiratory systems: Secondary | ICD-10-CM | POA: Insufficient documentation

## 2012-06-02 DIAGNOSIS — I10 Essential (primary) hypertension: Secondary | ICD-10-CM | POA: Insufficient documentation

## 2012-06-02 DIAGNOSIS — F17200 Nicotine dependence, unspecified, uncomplicated: Secondary | ICD-10-CM | POA: Insufficient documentation

## 2012-06-02 DIAGNOSIS — S2231XA Fracture of one rib, right side, initial encounter for closed fracture: Secondary | ICD-10-CM

## 2012-06-02 DIAGNOSIS — Z79899 Other long term (current) drug therapy: Secondary | ICD-10-CM | POA: Insufficient documentation

## 2012-06-02 DIAGNOSIS — R0609 Other forms of dyspnea: Secondary | ICD-10-CM | POA: Insufficient documentation

## 2012-06-02 DIAGNOSIS — R110 Nausea: Secondary | ICD-10-CM | POA: Insufficient documentation

## 2012-06-02 DIAGNOSIS — S2239XA Fracture of one rib, unspecified side, initial encounter for closed fracture: Secondary | ICD-10-CM | POA: Insufficient documentation

## 2012-06-02 DIAGNOSIS — Y929 Unspecified place or not applicable: Secondary | ICD-10-CM | POA: Insufficient documentation

## 2012-06-02 DIAGNOSIS — Y939 Activity, unspecified: Secondary | ICD-10-CM | POA: Insufficient documentation

## 2012-06-02 DIAGNOSIS — F411 Generalized anxiety disorder: Secondary | ICD-10-CM | POA: Insufficient documentation

## 2012-06-02 DIAGNOSIS — Z872 Personal history of diseases of the skin and subcutaneous tissue: Secondary | ICD-10-CM | POA: Insufficient documentation

## 2012-06-02 MED ORDER — OXYCODONE-ACETAMINOPHEN 5-325 MG PO TABS: 1.0000 | ORAL_TABLET | Freq: Four times a day (QID) | ORAL | Status: DC | PRN

## 2012-06-02 MED ORDER — OXYCODONE-ACETAMINOPHEN 5-325 MG PO TABS
2.0000 | ORAL_TABLET | Freq: Once | ORAL | Status: AC
  Administered 2012-06-02: 2 via ORAL
  Filled 2012-06-02: qty 2

## 2012-06-02 NOTE — ED Notes (Signed)
Fell 3 days ago, rib pain.  Sneezing this AM caused increased back pain.

## 2012-06-02 NOTE — ED Provider Notes (Signed)
Medical screening examination/treatment/procedure(s) were performed by non-physician practitioner and as supervising physician I was immediately available for consultation/collaboration.  Donnetta Hutching, MD 06/02/12 1133

## 2012-06-02 NOTE — ED Provider Notes (Signed)
History  This chart was scribed for Shane Gaskins, MD by Ardeen Jourdain, ED Scribe. This patient was seen in room APA11/[facility]11 and the patient's care was started at 1244.  CSN: [id number]  Arrival date & time 06/02/12  1210   First MD Initiated Contact with Patient 06/02/12 1244      Chief Complaint  Patient presents with  . Chest Pain     Patient is a 46 y.o. male presenting with chest pain. The history is provided by the patient. No language interpreter was used.  Chest Pain Pain location:  R lateral chest Pain quality: sharp, shooting and stabbing   Pain radiates to:  Does not radiate Pain radiates to the back: no   Pain severity:  Moderate Onset quality:  Sudden Duration:  3 days Timing:  Constant Progression:  Worsening Chronicity:  New Context: breathing and trauma   Context comment:  Fall Relieved by:  Nothing Worsened by:  Deep breathing, coughing and movement Ineffective treatments: OTC pain medication  Associated symptoms: anxiety and nausea   Associated symptoms: no abdominal pain, no back pain, no cough, no diaphoresis, no fever, no shortness of breath and not vomiting   Nausea:    Severity:  Mild   Onset quality:  Sudden   Duration:  3 days   Timing:  Constant   Progression:  Unchanged   Shane Allison is a 47 y.o. male who presents to the Emergency Department complaining of sudden onset, gradually worsening, constant right rib pain from a fall that occurred 3 days ago with associated painful breathing and nausea. He states the pain became severe after he sneezed this morning causing something to "pop." He reports falling down stairs 3 days ago and landing on his back on the stairs. Pt denies fever, neck pain, sore throat, visual disturbance, CP, cough, abdominal pain, emesis, diarrhea, urinary symptoms, back pain, HA, weakness, numbness and rash as associated symptoms.  He denies taking anything for pain today.   Past Medical History  Diagnosis Date   . Hypertension   . Psoriasis     Past Surgical History  Procedure Laterality Date  . Hand tendon surgery    . Thumb amputation      Partial     History reviewed. No pertinent family history.  History  Substance Use Topics  . Smoking status: Current Every Day Smoker -- 3.00 packs/day    Types: Cigarettes  . Smokeless tobacco: Not on file  . Alcohol Use: 0.0 oz/week     Comment: 6 pack/ day-  Last intake was 01/21/12      Review of Systems  Constitutional: Negative for fever and diaphoresis.  Respiratory: Negative for cough and shortness of breath.   Cardiovascular: Positive for chest pain.  Gastrointestinal: Positive for nausea. Negative for vomiting and abdominal pain.  Musculoskeletal: Negative for back pain.  All other systems reviewed and are negative.    Allergies  Penicillins  Home Medications   Current Outpatient Rx  Name  Route  Sig  Dispense  Refill  . albuterol (PROVENTIL HFA;VENTOLIN HFA) 108 (90 BASE) MCG/ACT inhaler   Inhalation   Inhale 2 puffs into the lungs every 6 (six) hours as needed. Shortness of Breath         . naproxen sodium (ALEVE) 220 MG tablet   Oral   Take 220 mg by mouth 2 (two) times daily as needed (pain).         Marland Kitchen oxyCODONE-acetaminophen (PERCOCET/ROXICET) 5-325 MG per tablet  Oral   Take 1 tablet by mouth every 4 (four) hours as needed for pain.   20 tablet   0   . quinapril-hydrochlorothiazide (ACCURETIC) 20-25 MG per tablet   Oral   Take 1 tablet by mouth daily.           Triage Vitals: BP 132/95  Pulse 114  Temp(Src) 98.1 F (36.7 C) (Oral)  Resp 22  Ht 5\' 11"  (1.803 m)  Wt 145 lb (65.772 kg)  BMI 20.23 kg/m2  SpO2 95%  Physical Exam  CONSTITUTIONAL: Well developed/well nourished, anxious  HEAD: Normocephalic/atraumatic EYES: EOMI/PERRL ENMT: Mucous membranes moist NECK: supple no meningeal signs SPINE:entire spine nontender, No bruising/crepitance/stepoffs noted to spine CV: S1/S2 noted, no  murmurs/rubs/gallops noted LUNGS: Lungs are clear to auscultation bilaterally, no apparent distress ABDOMEN: soft, nontender, no rebound or guarding. No flank bruising or tenderness.  No bruising or tenderness to abdomen Chest: Significant tenderness along right lateral ribs, no bruising or crepitance  GU:no cva tenderness NEURO: Pt is awake/alert, moves all extremitiesx4, he is ambulatory EXTREMITIES: pulses normal, full ROM SKIN: warm, color normal PSYCH: no abnormalities of mood noted  ED Course  Procedures (including critical care time)  DIAGNOSTIC STUDIES: Oxygen Saturation is 95% on room air, adequate by my interpretation.    COORDINATION OF CARE:  12:50 PM-Discussed treatment plan which includes CXR and pain medication with pt at bedside and pt agreed to plan.    Pt improved.  He is resting comfortably We discussed xray findings  DEFINITIVE FRACTURE CARE - RIB FRACTURE DISCUSSED USE OF PAIN MEDS, INCENTIVE SPIROMETER DISCUSSED NEED TO RETURN FOR WORSENED PAIN, FEVER >100.58F OR WORSENED SHORTNESS OF BREATH OVER 24-48 HOURS PT AGREEABLE    MDM  Nursing notes including past medical history and social history reviewed and considered in documentation xrays reviewed and considered       I personally performed the services described in this documentation, which was scribed in my presence. The recorded information has been reviewed and is accurate.    Shane Gaskins, MD 06/02/12 [phone]

## 2012-11-09 ENCOUNTER — Encounter (HOSPITAL_COMMUNITY): Payer: Self-pay | Admitting: *Deleted

## 2012-11-09 ENCOUNTER — Emergency Department (HOSPITAL_COMMUNITY)
Admission: EM | Admit: 2012-11-09 | Discharge: 2012-11-09 | Disposition: A | Discharge status: Home / Self Care | Payer: Self-pay | Attending: Emergency Medicine | Admitting: Emergency Medicine

## 2012-11-09 ENCOUNTER — Emergency Department (HOSPITAL_COMMUNITY): Payer: Self-pay

## 2012-11-09 DIAGNOSIS — X500XXA Overexertion from strenuous movement or load, initial encounter: Secondary | ICD-10-CM | POA: Insufficient documentation

## 2012-11-09 DIAGNOSIS — Z880 Allergy status to penicillin: Secondary | ICD-10-CM | POA: Insufficient documentation

## 2012-11-09 DIAGNOSIS — Z872 Personal history of diseases of the skin and subcutaneous tissue: Secondary | ICD-10-CM | POA: Insufficient documentation

## 2012-11-09 DIAGNOSIS — IMO0002 Reserved for concepts with insufficient information to code with codable children: Secondary | ICD-10-CM | POA: Insufficient documentation

## 2012-11-09 DIAGNOSIS — I10 Essential (primary) hypertension: Secondary | ICD-10-CM | POA: Insufficient documentation

## 2012-11-09 DIAGNOSIS — S46911A Strain of unspecified muscle, fascia and tendon at shoulder and upper arm level, right arm, initial encounter: Secondary | ICD-10-CM

## 2012-11-09 DIAGNOSIS — F17200 Nicotine dependence, unspecified, uncomplicated: Secondary | ICD-10-CM | POA: Insufficient documentation

## 2012-11-09 DIAGNOSIS — Y9389 Activity, other specified: Secondary | ICD-10-CM | POA: Insufficient documentation

## 2012-11-09 DIAGNOSIS — J449 Chronic obstructive pulmonary disease, unspecified: Secondary | ICD-10-CM | POA: Insufficient documentation

## 2012-11-09 DIAGNOSIS — Y929 Unspecified place or not applicable: Secondary | ICD-10-CM | POA: Insufficient documentation

## 2012-11-09 DIAGNOSIS — Z79899 Other long term (current) drug therapy: Secondary | ICD-10-CM | POA: Insufficient documentation

## 2012-11-09 DIAGNOSIS — J4489 Other specified chronic obstructive pulmonary disease: Secondary | ICD-10-CM | POA: Insufficient documentation

## 2012-11-09 HISTORY — DX: Chronic obstructive pulmonary disease, unspecified: J44.9

## 2012-11-09 MED ORDER — DICLOFENAC SODIUM 75 MG PO TBEC: 75.0000 mg | DELAYED_RELEASE_TABLET | Freq: Two times a day (BID) | ORAL | Status: DC

## 2012-11-09 MED ORDER — KETOROLAC TROMETHAMINE 10 MG PO TABS
10.0000 mg | ORAL_TABLET | Freq: Once | ORAL | Status: AC
  Administered 2012-11-09: 10 mg via ORAL
  Filled 2012-11-09: qty 1

## 2012-11-09 MED ORDER — PREDNISONE 50 MG PO TABS
60.0000 mg | ORAL_TABLET | Freq: Once | ORAL | Status: AC
  Administered 2012-11-09: 60 mg via ORAL
  Filled 2012-11-09: qty 1

## 2012-11-09 MED ORDER — DEXAMETHASONE 4 MG PO TABS: ORAL_TABLET | ORAL | Status: DC

## 2012-11-09 NOTE — ED Provider Notes (Signed)
Medical screening examination/treatment/procedure(s) were performed by non-physician practitioner and as supervising physician I was immediately available for consultation/collaboration.  Christin Moline, MD 11/09/12 1838 

## 2012-11-09 NOTE — ED Provider Notes (Signed)
CSN: [id number]     Arrival date & time 11/09/12  1323 History   First MD Initiated Contact with Patient 11/09/12 1422     Chief Complaint  Patient presents with  . Shoulder Pain   (Consider location/radiation/quality/duration/timing/severity/associated sxs/prior Treatment) HPI Comments: Pt states he was lifting an object and fell injuring the right shoulder. This occurred on Wed. 9/17. He continues to have pain. He has not taken any thing for pain. No previous procedures of the right shoulder. Pt denies being on blood thinning products.  He request evaluation of this problem.   Past Medical History  Diagnosis Date  . Hypertension   . Psoriasis   . COPD (chronic obstructive pulmonary disease)    Past Surgical History  Procedure Laterality Date  . Hand tendon surgery    . Thumb amputation      Partial    History reviewed. No pertinent family history. History  Substance Use Topics  . Smoking status: Current Every Day Smoker -- 3.00 packs/day    Types: Cigarettes  . Smokeless tobacco: Not on file  . Alcohol Use: 0.0 oz/week     Comment: 6 pack/ day-  Last intake was 01/21/12    Review of Systems  Constitutional: Negative for activity change.       All ROS Neg except as noted in HPI  HENT: Negative for nosebleeds and neck pain.   Eyes: Negative for photophobia and discharge.  Respiratory: Positive for wheezing. Negative for cough and shortness of breath.   Cardiovascular: Negative for chest pain and palpitations.  Gastrointestinal: Negative for abdominal pain and blood in stool.  Genitourinary: Negative for dysuria, frequency and hematuria.  Musculoskeletal: Positive for arthralgias. Negative for back pain.  Skin: Positive for rash.  Neurological: Negative for dizziness, seizures and speech difficulty.  Psychiatric/Behavioral: Negative for hallucinations and confusion.    Allergies  Penicillins  Home Medications   Current Outpatient Rx  Name  Route  Sig  Dispense   Refill  . oxyCODONE-acetaminophen (PERCOCET/ROXICET) 5-325 MG per tablet   Oral   Take 1 tablet by mouth every 6 (six) hours as needed for pain.   20 tablet   0   . quinapril-hydrochlorothiazide (ACCURETIC) 20-25 MG per tablet   Oral   Take 1 tablet by mouth daily.          BP 147/99  Pulse 96  Temp(Src) 98.8 F (37.1 C)  Resp 20  Ht 5\' 5"  (1.651 m)  Wt 145 lb (65.772 kg)  BMI 24.13 kg/m2  SpO2 98% Physical Exam  Nursing note and vitals reviewed. Constitutional: He is oriented to person, place, and time. He appears well-developed and well-nourished.  Non-toxic appearance.  HENT:  Head: Normocephalic.  Right Ear: Tympanic membrane and external ear normal.  Left Ear: Tympanic membrane and external ear normal.  Eyes: EOM and lids are normal. Pupils are equal, round, and reactive to light.  Neck: Normal range of motion. Neck supple. Carotid bruit is not present.  Cardiovascular: Normal rate, regular rhythm, normal heart sounds, intact distal pulses and normal pulses.   Pulmonary/Chest: No respiratory distress. He has wheezes. He has rhonchi.  Abdominal: Soft. Bowel sounds are normal. There is no tenderness. There is no guarding.  Musculoskeletal: Normal range of motion.  Pain of the anterior right shoulder. No deformity. Mod crepitus noted. FROm of the right elbow , wrist, and fingers.  Lymphadenopathy:       Head (right side): No submandibular adenopathy present.  Head (left side): No submandibular adenopathy present.    He has no cervical adenopathy.  Neurological: He is alert and oriented to person, place, and time. He has normal strength. No cranial nerve deficit or sensory deficit.  Skin: Skin is warm and dry.  Psychiatric: He has a normal mood and affect. His speech is normal.    ED Course  Procedures (including critical care time) Labs Review Labs Reviewed - No data to display Imaging Review Dg Shoulder Right  11/09/2012   CLINICAL DATA:  Shoulder pain  after lifting injury.  EXAM: RIGHT SHOULDER - 2+ VIEW  COMPARISON:  Prior chest x-rays. No comparison shoulder films.  FINDINGS: On the axillary view, lucency seen at the level of the humeral neck/proximal humerus (see arrows). Etiology indeterminate. For further delineation, MR imaging may be considered.  No fracture or dislocation.  Visualized lungs are clear.  IMPRESSION: On the axillary view, lucency seen at the level of the humeral neck/proximal humerus (see arrows). Etiology indeterminate. For further delineation, MR imaging may be considered.  No fracture or dislocation.   Electronically Signed   By: Bridgett Larsson   On: 11/09/2012 14:16    MDM  No diagnosis found. *I have reviewed nursing notes, vital signs, and all appropriate lab and imaging results for this patient.** Xray is negative for fx or dislocation. Pt has a lucency at the humeral neck, but no other abnormalities. Pt fitted with shoulder immobilizer. Given Rx for decadron and voltaren. Pt to see orthopedics if not improving.   Kathie Dike, PA-C 11/09/12 1630

## 2012-11-09 NOTE — ED Notes (Signed)
Injury to rt shoulder when lifting .

## 2013-02-15 ENCOUNTER — Emergency Department (HOSPITAL_COMMUNITY)
Admission: EM | Admit: 2013-02-15 | Discharge: 2013-02-15 | Disposition: A | Discharge status: Home / Self Care | Payer: Self-pay | Attending: Emergency Medicine | Admitting: Emergency Medicine

## 2013-02-15 ENCOUNTER — Encounter (HOSPITAL_COMMUNITY): Payer: Self-pay | Admitting: Emergency Medicine

## 2013-02-15 DIAGNOSIS — Z872 Personal history of diseases of the skin and subcutaneous tissue: Secondary | ICD-10-CM | POA: Insufficient documentation

## 2013-02-15 DIAGNOSIS — G8929 Other chronic pain: Secondary | ICD-10-CM | POA: Insufficient documentation

## 2013-02-15 DIAGNOSIS — Z880 Allergy status to penicillin: Secondary | ICD-10-CM | POA: Insufficient documentation

## 2013-02-15 DIAGNOSIS — I10 Essential (primary) hypertension: Secondary | ICD-10-CM | POA: Insufficient documentation

## 2013-02-15 DIAGNOSIS — R209 Unspecified disturbances of skin sensation: Secondary | ICD-10-CM | POA: Insufficient documentation

## 2013-02-15 DIAGNOSIS — F17200 Nicotine dependence, unspecified, uncomplicated: Secondary | ICD-10-CM | POA: Insufficient documentation

## 2013-02-15 DIAGNOSIS — J441 Chronic obstructive pulmonary disease with (acute) exacerbation: Secondary | ICD-10-CM | POA: Insufficient documentation

## 2013-02-15 DIAGNOSIS — M25511 Pain in right shoulder: Secondary | ICD-10-CM

## 2013-02-15 DIAGNOSIS — M25519 Pain in unspecified shoulder: Secondary | ICD-10-CM | POA: Insufficient documentation

## 2013-02-15 MED ORDER — DICLOFENAC SODIUM 75 MG PO TBEC: 75.0000 mg | DELAYED_RELEASE_TABLET | Freq: Two times a day (BID) | ORAL | Status: DC

## 2013-02-15 MED ORDER — DEXAMETHASONE 6 MG PO TABS: ORAL_TABLET | ORAL | Status: DC

## 2013-02-15 NOTE — ED Notes (Signed)
Pt with continued right shoulder pain since September after a fall, unable to get in to see PCP per pt

## 2013-02-15 NOTE — ED Provider Notes (Signed)
Medical screening examination/treatment/procedure(s) were performed by non-physician practitioner and as supervising physician I was immediately available for consultation/collaboration.  EKG Interpretation   None         Gilda Crease, MD 02/15/13 1348

## 2013-02-15 NOTE — ED Provider Notes (Signed)
CSN: [id number]     Arrival date & time 02/15/13  1205 History   First MD Initiated Contact with Patient 02/15/13 1251     Chief Complaint  Patient presents with  . Shoulder Pain   (Consider location/radiation/quality/duration/timing/severity/associated sxs/prior Treatment) HPI Comments: Pt states he has had problem with the left shoulder since Sept. . For the past month he has noted some numbness of the right shoulder almost daily. Last night night the pain became more severe. He presents to ED for additional evaluation. He has not seen an orthopedic MD for evaluation as instructed in Sept.  Patient is a 47 y.o. male presenting with shoulder pain. The history is provided by the patient.  Shoulder Pain This is a chronic problem. The current episode started more than 1 month ago. The problem occurs daily. The problem has been gradually worsening. Associated symptoms include numbness. Pertinent negatives include no abdominal pain, arthralgias, chest pain, coughing, fever, neck pain or weakness. Exacerbated by: movement. He has tried nothing for the symptoms. The treatment provided no relief.    Past Medical History  Diagnosis Date  . Hypertension   . Psoriasis   . COPD (chronic obstructive pulmonary disease)    Past Surgical History  Procedure Laterality Date  . Hand tendon surgery    . Thumb amputation      Partial    History reviewed. No pertinent family history. History  Substance Use Topics  . Smoking status: Current Every Day Smoker -- 3.00 packs/day    Types: Cigarettes  . Smokeless tobacco: Not on file  . Alcohol Use: 0.0 oz/week     Comment: 6 pack/ day    Review of Systems  Constitutional: Negative for fever and activity change.       All ROS Neg except as noted in HPI  HENT: Negative for nosebleeds.   Eyes: Negative for photophobia and discharge.  Respiratory: Positive for shortness of breath. Negative for cough and wheezing.   Cardiovascular: Negative for chest  pain and palpitations.  Gastrointestinal: Negative for abdominal pain and blood in stool.  Genitourinary: Negative for dysuria, frequency and hematuria.  Musculoskeletal: Negative for arthralgias, back pain and neck pain.  Skin: Negative.   Neurological: Positive for numbness. Negative for dizziness, seizures, speech difficulty and weakness.  Psychiatric/Behavioral: Negative for hallucinations and confusion.    Allergies  Penicillins  Home Medications   Current Outpatient Rx  Name  Route  Sig  Dispense  Refill  . ibuprofen (ADVIL,MOTRIN) 200 MG tablet   Oral   Take 400 mg by mouth every 8 (eight) hours as needed for moderate pain.          BP 166/95  Pulse 85  Temp(Src) 98.6 F (37 C) (Oral)  Resp 18  Ht 5\' 7"  (1.702 m)  Wt 145 lb (65.772 kg)  BMI 22.71 kg/m2  SpO2 98% Physical Exam  Nursing note and vitals reviewed. Constitutional: He is oriented to person, place, and time. He appears well-developed and well-nourished.  Non-toxic appearance.  HENT:  Head: Normocephalic.  Right Ear: Tympanic membrane and external ear normal.  Left Ear: Tympanic membrane and external ear normal.  Eyes: EOM and lids are normal. Pupils are equal, round, and reactive to light.  Neck: Normal range of motion. Neck supple. Carotid bruit is not present.  Cardiovascular: Normal rate, regular rhythm, normal heart sounds, intact distal pulses and normal pulses.   Pulmonary/Chest: Breath sounds normal. No respiratory distress.  Abdominal: Soft. Bowel sounds are normal. There is  no tenderness. There is no guarding.  Musculoskeletal: Normal range of motion.  There is pain and crepitus with attempted range of motion of the right shoulder. There is no deformity appreciated. There is no hot joints appreciated. There is full range of motion of the right elbow and wrist. There are degenerative changes of the joints of the hands right and left upper extremity. The radial pulses are 2+ bilaterally.   Lymphadenopathy:       Head (right side): No submandibular adenopathy present.       Head (left side): No submandibular adenopathy present.    He has no cervical adenopathy.  Neurological: He is alert and oriented to person, place, and time. He has normal strength. No cranial nerve deficit or sensory deficit.  Skin: Skin is warm and dry.  Psychiatric: He has a normal mood and affect. His speech is normal.    ED Course  Procedures (including critical care time) Labs Review Labs Reviewed - No data to display Imaging Review No results found.  EKG Interpretation   None       MDM  No diagnosis found. *I have reviewed nursing notes, vital signs, and all appropriate lab and imaging results for this patient.**  I have reviewed the previous notes from the emergency department. I've also reviewed the x-rays that were taken in September of 2014. The patient had a lucency involving the neck of the humerus. He was advised to see Dr. Juanetta Gosling for MRI concerning this. The patient states that he has not been able to get the office to answer the phone. The plan at this time is toxic and as the patient to see his primary physician for MRI involving the shoulder to evaluate this lucency area, and also for evaluation and management of the pain that he is having any shoulder. Prescription for diclofenac and Decadron given to the patient for soreness and discomfort. Patient also fitted with a sling.  Kathie Dike, PA-C 02/15/13 1340

## 2013-03-06 ENCOUNTER — Other Ambulatory Visit (HOSPITAL_COMMUNITY): Payer: Self-pay | Admitting: Pulmonary Disease

## 2013-03-06 DIAGNOSIS — M25519 Pain in unspecified shoulder: Secondary | ICD-10-CM

## 2013-03-07 ENCOUNTER — Ambulatory Visit (HOSPITAL_COMMUNITY)
Admission: RE | Admit: 2013-03-07 | Discharge: 2013-03-07 | Disposition: A | Discharge status: Home / Self Care | Payer: Self-pay | Source: Ambulatory Visit | Attending: Pulmonary Disease | Admitting: Pulmonary Disease

## 2013-03-07 ENCOUNTER — Other Ambulatory Visit (HOSPITAL_COMMUNITY): Payer: Self-pay | Admitting: Pulmonary Disease

## 2013-03-07 DIAGNOSIS — Z139 Encounter for screening, unspecified: Secondary | ICD-10-CM

## 2013-03-07 DIAGNOSIS — S46819A Strain of other muscles, fascia and tendons at shoulder and upper arm level, unspecified arm, initial encounter: Secondary | ICD-10-CM | POA: Insufficient documentation

## 2013-03-07 DIAGNOSIS — M25519 Pain in unspecified shoulder: Secondary | ICD-10-CM | POA: Insufficient documentation

## 2013-03-07 DIAGNOSIS — W19XXXA Unspecified fall, initial encounter: Secondary | ICD-10-CM | POA: Insufficient documentation

## 2013-03-08 ENCOUNTER — Ambulatory Visit (HOSPITAL_COMMUNITY): Payer: Self-pay

## 2013-04-02 ENCOUNTER — Encounter: Payer: Self-pay | Admitting: Orthopedic Surgery

## 2013-04-02 ENCOUNTER — Ambulatory Visit (INDEPENDENT_AMBULATORY_CARE_PROVIDER_SITE_OTHER): Payer: Self-pay | Admitting: Orthopedic Surgery

## 2013-04-02 VITALS — BP 123/82 | Ht 67.0 in | Wt 140.0 lb

## 2013-04-02 DIAGNOSIS — S40029A Contusion of unspecified upper arm, initial encounter: Secondary | ICD-10-CM

## 2013-04-02 DIAGNOSIS — M75110 Incomplete rotator cuff tear or rupture of unspecified shoulder, not specified as traumatic: Secondary | ICD-10-CM

## 2013-04-02 DIAGNOSIS — S40019A Contusion of unspecified shoulder, initial encounter: Secondary | ICD-10-CM | POA: Insufficient documentation

## 2013-04-02 DIAGNOSIS — S43429A Sprain of unspecified rotator cuff capsule, initial encounter: Secondary | ICD-10-CM

## 2013-04-02 HISTORY — DX: Incomplete rotator cuff tear or rupture of unspecified shoulder, not specified as traumatic: M75.110

## 2013-04-02 MED ORDER — ACETAMINOPHEN-CODEINE 300-30 MG PO TABS: 1.0000 | ORAL_TABLET | ORAL | Status: DC | PRN

## 2013-04-02 NOTE — Progress Notes (Signed)
Patient ID: Shane Allison, male   DOB: 22-Dec-1965, 48 y.o.   MRN: [id number] Chief Complaint  Patient presents with  . Shoulder Pain    Right shoulder pain. Referred by Dr. Luan Pulling for a consult. MRI at [facility].    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 his progressive and increasing pain over that time with decreased range of motion pain at night pain with forward elevation he had an MRI and an x-ray his MRI showed partial rotator cuff tear primarily intrasubstance with bone contusion of the greater tuberosity cannot rule out fracture  As sharp throbbing stabbing burning pain which is a 10 out of 10 constant associated with some numbness tingling catching locking and swelling worse with movement  He listed his review of systems is negative  Is allergic to penicillin he has some COPD and surgery in his right hand and left thumb his family history is positive for heart disease arthritis lung disease asthma and diabetes 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 Sinda Du as indicated  BP 123/82  Ht 5\' 7"  (1.702 m)  Wt 140 lb (63.504 kg)  BMI 21.92 kg/m2 He is alert and oriented x3 mood and affect are normal walks normally general appearance normal thin  Right shoulder tenderness in the pericardial region his flexion is 120 he has weakness in his rotator cuff shoulder remains stable internal and external rotation strength is normal skin is intact pulses are normal good sensation normal reflexes lymph nodes are negative  X-rays negative for acute fracture MRI was reviewed it shows a bone contusion of the greater tuberosity and intrasubstance degenerative tearing of the supraspinatus tendon and slight partial tearing  He has not had any nonoperative treatment and he is paying for this out of pocket  Recommend  subacromial injection Home exercise program Pain medication  Followup to review symptoms    Procedure inject subacromial space right shoulder Diagnosis rotator cuff syndrome right shoulder Medication Depo-Medrol 40 mg, 1 cc and lidocaine 1% 3 cc Verbal consent Timeout completed  The injection site was cleaned with alcohol and sprayed with ethyl chloride. From a posterior approach a 20-gauge needle was injected in the subacromial space. The medication went in easily. There were no complications. The wound was covered with a sterile bandage. Appropriate precautions were given.

## 2013-04-02 NOTE — Patient Instructions (Addendum)
You have received a steroid shot. 15% of patients experience increased pain at the injection site with in the next 24 hours. This is best treated with ice and tylenol extra strength 2 tabs every 8 hours. If you are still having pain please call the office.    HEP x 6 weeks

## 2013-04-16 ENCOUNTER — Ambulatory Visit: Payer: Self-pay | Admitting: Orthopedic Surgery

## 2013-05-14 ENCOUNTER — Encounter: Payer: Self-pay | Admitting: Orthopedic Surgery

## 2013-05-14 ENCOUNTER — Ambulatory Visit (INDEPENDENT_AMBULATORY_CARE_PROVIDER_SITE_OTHER): Payer: Self-pay | Admitting: Orthopedic Surgery

## 2013-05-14 VITALS — BP 138/90 | Ht 67.0 in | Wt 140.0 lb

## 2013-05-14 DIAGNOSIS — R209 Unspecified disturbances of skin sensation: Secondary | ICD-10-CM

## 2013-05-14 DIAGNOSIS — R200 Anesthesia of skin: Secondary | ICD-10-CM

## 2013-05-14 DIAGNOSIS — M792 Neuralgia and neuritis, unspecified: Secondary | ICD-10-CM

## 2013-05-14 DIAGNOSIS — IMO0002 Reserved for concepts with insufficient information to code with codable children: Secondary | ICD-10-CM

## 2013-05-14 DIAGNOSIS — M47812 Spondylosis without myelopathy or radiculopathy, cervical region: Secondary | ICD-10-CM

## 2013-05-14 DIAGNOSIS — R202 Paresthesia of skin: Secondary | ICD-10-CM

## 2013-05-14 HISTORY — DX: Neuralgia and neuritis, unspecified: M79.2

## 2013-05-14 HISTORY — DX: Anesthesia of skin: R20.2

## 2013-05-14 HISTORY — DX: Spondylosis without myelopathy or radiculopathy, cervical region: M47.812

## 2013-05-14 HISTORY — DX: Paresthesia of skin: R20.0

## 2013-05-14 MED ORDER — ACETAMINOPHEN-CODEINE #3 300-30 MG PO TABS: 1.0000 | ORAL_TABLET | ORAL | Status: DC | PRN

## 2013-05-14 NOTE — Progress Notes (Signed)
Patient ID: Shane Allison, male   DOB: August 04, 1965, 48 y.o.   MRN: [id number]  Chief Complaint  Patient presents with  . 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 He had a bone contusion like injury to the greater tuberosity which showed up on the MRI he persists with pain after a fall last September. He had a home exercise program, he does not have insurance, he had a subacromial injection he did not improve  Reexamination he has strong intact rotator cuff. He is crepitance on range of motion of the shoulder but range of motion is full. There is no instability. He had a neck x-ray in 2011 showed fairly severe cervical spondylosis  Recommend MRI of the cervical spine to evaluate the discs for possible nerve impingement  Reorder Tylenol No. 3 stop ibuprofen

## 2013-05-14 NOTE — Patient Instructions (Signed)
Stop Ibuprofen

## 2013-05-29 ENCOUNTER — Ambulatory Visit (HOSPITAL_COMMUNITY)
Admission: RE | Admit: 2013-05-29 | Discharge: 2013-05-29 | Disposition: A | Discharge status: Home / Self Care | Payer: Self-pay | Source: Ambulatory Visit | Attending: Orthopedic Surgery | Admitting: Orthopedic Surgery

## 2013-05-29 DIAGNOSIS — M539 Dorsopathy, unspecified: Secondary | ICD-10-CM | POA: Insufficient documentation

## 2013-05-29 DIAGNOSIS — M4802 Spinal stenosis, cervical region: Secondary | ICD-10-CM | POA: Insufficient documentation

## 2013-05-29 DIAGNOSIS — M47812 Spondylosis without myelopathy or radiculopathy, cervical region: Secondary | ICD-10-CM | POA: Insufficient documentation

## 2013-05-29 DIAGNOSIS — R200 Anesthesia of skin: Secondary | ICD-10-CM

## 2013-05-29 DIAGNOSIS — M25519 Pain in unspecified shoulder: Secondary | ICD-10-CM | POA: Insufficient documentation

## 2013-05-29 DIAGNOSIS — R202 Paresthesia of skin: Secondary | ICD-10-CM

## 2013-05-29 DIAGNOSIS — M542 Cervicalgia: Secondary | ICD-10-CM | POA: Insufficient documentation

## 2013-05-30 ENCOUNTER — Other Ambulatory Visit (HOSPITAL_COMMUNITY): Payer: Self-pay

## 2013-06-03 ENCOUNTER — Ambulatory Visit: Payer: Self-pay | Admitting: Orthopedic Surgery

## 2013-06-06 ENCOUNTER — Encounter: Payer: Self-pay | Admitting: Orthopedic Surgery

## 2013-06-06 ENCOUNTER — Ambulatory Visit (INDEPENDENT_AMBULATORY_CARE_PROVIDER_SITE_OTHER): Payer: Self-pay | Admitting: Orthopedic Surgery

## 2013-06-06 VITALS — BP 133/87 | Ht 67.0 in | Wt 140.0 lb

## 2013-06-06 DIAGNOSIS — M47812 Spondylosis without myelopathy or radiculopathy, cervical region: Secondary | ICD-10-CM

## 2013-06-06 DIAGNOSIS — R202 Paresthesia of skin: Secondary | ICD-10-CM

## 2013-06-06 DIAGNOSIS — IMO0002 Reserved for concepts with insufficient information to code with codable children: Secondary | ICD-10-CM

## 2013-06-06 DIAGNOSIS — M792 Neuralgia and neuritis, unspecified: Secondary | ICD-10-CM

## 2013-06-06 DIAGNOSIS — R209 Unspecified disturbances of skin sensation: Secondary | ICD-10-CM

## 2013-06-06 DIAGNOSIS — R200 Anesthesia of skin: Secondary | ICD-10-CM

## 2013-06-06 NOTE — Patient Instructions (Addendum)
Referral to Neuro surgery Cervical spondylosis with cord compression   All further treatment will be with the Neurosurgeron   Out of work x 1 month     Cervical Radiculopathy Cervical radiculopathy happens when a nerve in the neck is pinched or bruised by a slipped (herniated) disk or by arthritic changes in the bones of the cervical spine. This can occur due to an injury or as part of the normal aging process. Pressure on the cervical nerves can cause pain or numbness that runs from your neck all the way down into your arm and fingers. CAUSES  There are many possible causes, including:  Injury.  Muscle tightness in the neck from overuse.  Swollen, painful joints (arthritis).  Breakdown or degeneration in the bones and joints of the spine (spondylosis) due to aging.  Bone spurs that may develop near the cervical nerves. SYMPTOMS  Symptoms include pain, weakness, or numbness in the affected arm and hand. Pain can be severe or irritating. Symptoms may be worse when extending or turning the neck. DIAGNOSIS  Your caregiver will ask about your symptoms and do a physical exam. He or she may test your strength and reflexes. X-rays, CT scans, and MRI scans may be needed in cases of injury or if the symptoms do not go away after a period of time. Electromyography (EMG) or nerve conduction testing may be done to study how your nerves and muscles are working. TREATMENT  Your caregiver may recommend certain exercises to help relieve your symptoms. Cervical radiculopathy can, and often does, get better with time and treatment. If your problems continue, treatment options may include:  Wearing a soft collar for short periods of time.  Physical therapy to strengthen the neck muscles.  Medicines, such as nonsteroidal anti-inflammatory drugs (NSAIDs), oral corticosteroids, or spinal injections.  Surgery. Different types of surgery may be done depending on the cause of your problems. HOME CARE  INSTRUCTIONS   Put ice on the affected area.  Put ice in a plastic bag.  Place a towel between your skin and the bag.  Leave the ice on for 15-20 minutes, 03-04 times a day or as directed by your caregiver.  If ice does not help, you can try using heat. Take a warm shower or bath, or use a hot water bottle as directed by your caregiver.  You may try a gentle neck and shoulder massage.  Use a flat pillow when you sleep.  Only take over-the-counter or prescription medicines for pain, discomfort, or fever as directed by your caregiver.  If physical therapy was prescribed, follow your caregiver's directions.  If a soft collar was prescribed, use it as directed. SEEK IMMEDIATE MEDICAL CARE IF:   Your pain gets much worse and cannot be controlled with medicines.  You have weakness or numbness in your hand, arm, face, or leg.  You have a high fever or a stiff, rigid neck.  You lose bowel or bladder control (incontinence).  You have trouble with walking, balance, or speaking. MAKE SURE YOU:   Understand these instructions.  Will watch your condition.  Will get help right away if you are not doing well or get worse. Document Released: 11/02/2000 Document Revised: 05/02/2011 Document Reviewed: 09/21/2010 [facility] Patient Information 2014 [place], [place]. Herniated Disk The bones of your spinal column (vertebrae) protect your spinal cord and nerves that go into your arms and legs. The vertebrae are separated by disks that cushion the spinal column and put space between your vertebrae. This  allows movement between the vertebrae, which allows you to bend, rotate, and move your body from side to side. Sometimes, the disks move out of place (herniate) or break open (rupture) from injury or strain. The most common area for a disk herniation is in the lower back (lumbar area). Sometimes herniation occurs in the neck (cervical) disks.  CAUSES  As we grow older, the strong, fibrous cords  that connect the vertebrae and support and surround the disks (ligaments) start to weaken. A strain on the back may cause a break in the disk ligaments. RISK FACTORS Herniated disks occur most often in men who are aged 41 years to 35 years, usually after strenuous activity. Other risk factors include conditions present at birth (congenital) that affect the size of the lumbar spinal canal. Additionally, a narrowing of the areas where the nerves exit the spinal canal can occur as you age. SYMPTOMS  Symptoms of a herniated disk vary. You may have weakness in certain muscles. This weakness can include difficulty lifting your leg or arm, difficulty standing on your toes on one side, or difficulty squeezing tightly with one of your hands. You may have numbness. You may feel a mild tingling, dull ache, or a burning or pulsating pain. In some cases, the pain is severe enough that you are unable to move. The pain most often occurs on one side of the body. The pain often starts slowly. It may get worse:  After you sit or stand.  At night.  When you sneeze, cough, or laugh.  When you bend backwards or walk more than a few yards. The pain, numbness, or weakness will often go away or improve a lot over a period of weeks to months. Herniated lumbar disk Symptoms of a herniated lumbar disk may include sharp pain in one part of your leg, hip, or buttocks and numbness in other parts. You also may feel pain or numbness on the back of your calf or the top or sole of your foot. The same leg also may feel weak. Herniated cervical disk Symptoms of a herniated cervical disk may include pain when you move your neck, deep pain near or over your shoulder blade, or pain that moves to your upper arm, forearm, or fingers. DIAGNOSIS  To diagnose a herniated disk, your caregiver will perform a physical exam. Your caregiver also may perform diagnostic tests to see your disk or to test the reaction of your muscles and the  function of your nerves. During the physical exam, your caregiver may ask you to:  Sit, stand, and walk. While you walk, your caregiver may ask you to try walking on your toes and then your heels.  Bend forward, backward, and sideways.  Raise your shoulders, elbow, wrist, and fingers and check your strength during these tasks. Your caregiver will check for:  Numbness or loss of feeling.  Muscle reflexes, which may be slower or missing.  Muscle strength, which may be weaker.  Posture or the way your spine curves. Diagnostic tests that may be done include:  A spinal X-ray exam to rule out other causes of back pain.  Magnetic resonance imaging (MRI) or computed tomography (CT) scan, which will show if the herniated disk is pressing on your spinal canal.  Electromyography. This is sometimes used to identify the specific area of nerve involvement. TREATMENT  Initial treatment for a herniated disk is a short period of rest with medicines for pain. Pain medicines can include nonsteroidal anti-inflammatory medicines (NSAIDs), muscle  relaxants for back spasms, and (rarely) narcotic pain medicine for severe pain that does not respond to NSAID use. Bed rest is often limited to 1 or 2 days at the most because prolonged rest can delay recovery. When the herniation involves the lower back, sitting should be avoided as much as possible because sitting increases pressure on the ruptured disk. Sometimes a soft neck collar will be prescribed for a few days to weeks to help support your neck in the case of a cervical herniation. Physical therapy is often prescribed for patients with disk disease. Physical therapists will teach you how to properly lift, dress, walk, and perform other activities. They will work on strengthening the muscles that help support your spine. In some cases, physical therapy alone is not enough to treat a herniated disk. Steroid injections along the involved nerve root may be needed  to help control pain. The steroid is injected in the area of the herniated disk and helps by reducing swelling around the disk. Sometimes surgery is the best option to treat a herniated disk.  SEEK IMMEDIATE MEDICAL CARE IF:   You have numbness, tingling, weakness, or problems with the use of your arms or legs.  You have severe headaches that are not relieved with the use of medicines.  You notice a change in your bowel or bladder control.  You have increasing pain in any areas of your body.  You experience shortness of breath, dizziness, or fainting. MAKE SURE YOU:   Understand these instructions.  Will watch your condition.  Will get help right away if you are not doing well or get worse. Document Released: 02/05/2000 Document Revised: 05/02/2011 Document Reviewed: 09/10/2010 [facility] Patient Information 2014 [place], [place].   C2-3: Spondylosis with bilateral uncovertebral hypertrophy. Foraminal encroachment bilaterally could possibly affect the C3 nerve roots.   C3-4: Uncovertebral hypertrophy bilaterally results in mild foraminal narrowing on the right and moderate foraminal narrowing on the left. Either C4 nerve root could be affected.   C4-5: Spondylosis with endplate osteophytes and bulging of the disc. Narrowing of the ventral subarachnoid space but no compression of the cord. Foraminal narrowing bilaterally could affect the C5 nerve roots.   C5-6: Spondylosis with endplate osteophytes and protruding disc material more prominent to the left of midline. Effacement of the ventral subarachnoid space with some deformity of the left side of the cord. The foramen on the right is widely patent. Foraminal stenosis on the left likely to affect the C6 nerve root.   C6-7: Spondylosis with endplate osteophytes and bulging of the disc. Narrowing of the ventral subarachnoid space but no compression of the cord. Mild foraminal narrowing bilaterally.   C7-T1:  Unremarkable  interspace.   No abnormal cord edema.    MRI REPORT  IMPRESSION: Spondylosis at C2-3, C3-4 and C4-5 with osteophytic encroachment upon the foramina at those levels that would have potential for neural compression or irritation on either or both sides.   Spondylosis and protruding disc material at C5-6 more prominent in the left posterior lateral direction. Some deformity of the left side of the cord. Severe foraminal stenosis on the left quite likely to affect the left C6 nerve root.   Spondylosis at C6-7 with only mild foraminal encroachment bilaterally.

## 2013-06-06 NOTE — Progress Notes (Signed)
Patient ID: FLOR HOUDESHELL, male   DOB: 09/20/1965, 48 y.o.   MRN: [id number]   Chief Complaint  Patient presents with  . 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 He had a bone contusion like injury to the greater tuberosity which showed up on the MRI he persists with pain after a fall last September. He had a home exercise program, he does not have insurance, he had a subacromial injection he did not improve  Review of systems he reports increasing pain weakness and pain when he coughs  Repeat examination BP 133/87  Ht 5\' 7"  (1.702 m)  Wt 140 lb (63.504 kg)  BMI 21.92 kg/m2 Appearance is normal is oriented x3 mood is normal ambulation normal  Grip strength is normal today. Gross sensation is normal today.  MRI review with the following findings C2-3: Spondylosis with bilateral uncovertebral hypertrophy. Foraminal encroachment bilaterally could possibly affect the C3 nerve roots.   C3-4: Uncovertebral hypertrophy bilaterally results in mild foraminal narrowing on the right and moderate foraminal narrowing on the left. Either C4 nerve root could be affected.   C4-5: Spondylosis with endplate osteophytes and bulging of the disc. Narrowing of the ventral subarachnoid space but no compression of the cord. Foraminal narrowing bilaterally could affect the C5 nerve roots.   C5-6: Spondylosis with endplate osteophytes and protruding disc material more prominent to the left of midline. Effacement of the ventral subarachnoid space with some deformity of the left side of the cord. The foramen on the right is widely patent. Foraminal stenosis on the left likely to affect the C6 nerve root.   C6-7: Spondylosis with endplate osteophytes and  bulging of the disc. Narrowing of the ventral subarachnoid space but no compression of the cord. Mild foraminal narrowing bilaterally.   C7-T1:  Unremarkable interspace.   No abnormal cord edema.   IMPRESSION: Spondylosis at C2-3, C3-4 and C4-5 with osteophytic encroachment upon the foramina at those levels that would have potential for neural compression or irritation on either or both sides.   Spondylosis and protruding disc material at C5-6 more prominent in the left posterior lateral direction. Some deformity of the left side of the cord. Severe foraminal stenosis on the left quite likely to affect the left C6 nerve root.   Spondylosis at C6-7 with only mild foraminal encroachment Bilaterally.   Encounter Diagnoses  Name Primary?  . Numbness and tingling in hands   . Cervical spondylosis without myelopathy Yes  . Radicular pain in right arm     Recommend neurosurgical referral for cervical spondylosis worsening

## 2013-06-07 ENCOUNTER — Telehealth: Payer: Self-pay | Admitting: *Deleted

## 2013-06-07 ENCOUNTER — Other Ambulatory Visit: Payer: Self-pay | Admitting: *Deleted

## 2013-06-07 DIAGNOSIS — M47812 Spondylosis without myelopathy or radiculopathy, cervical region: Secondary | ICD-10-CM

## 2013-06-07 NOTE — Telephone Encounter (Signed)
Faxed office notes and referral to [facility]. Awaiting appointment. 

## 2013-06-14 NOTE — Telephone Encounter (Signed)
Patient has an appointment with Dr. Christella Noa on 07/08/13 at 10:00 am. Patient is aware of appointment date and time.

## 2013-10-04 ENCOUNTER — Emergency Department (HOSPITAL_COMMUNITY)
Admission: EM | Admit: 2013-10-04 | Discharge: 2013-10-04 | Disposition: A | Discharge status: Home / Self Care | Payer: Self-pay | Attending: Emergency Medicine | Admitting: Emergency Medicine

## 2013-10-04 ENCOUNTER — Encounter (HOSPITAL_COMMUNITY): Payer: Self-pay | Admitting: Emergency Medicine

## 2013-10-04 ENCOUNTER — Emergency Department (HOSPITAL_COMMUNITY): Payer: Self-pay

## 2013-10-04 DIAGNOSIS — Z872 Personal history of diseases of the skin and subcutaneous tissue: Secondary | ICD-10-CM | POA: Insufficient documentation

## 2013-10-04 DIAGNOSIS — R509 Fever, unspecified: Secondary | ICD-10-CM | POA: Insufficient documentation

## 2013-10-04 DIAGNOSIS — R0602 Shortness of breath: Secondary | ICD-10-CM | POA: Insufficient documentation

## 2013-10-04 DIAGNOSIS — Z880 Allergy status to penicillin: Secondary | ICD-10-CM | POA: Insufficient documentation

## 2013-10-04 DIAGNOSIS — F17200 Nicotine dependence, unspecified, uncomplicated: Secondary | ICD-10-CM | POA: Insufficient documentation

## 2013-10-04 DIAGNOSIS — I10 Essential (primary) hypertension: Secondary | ICD-10-CM | POA: Insufficient documentation

## 2013-10-04 DIAGNOSIS — J441 Chronic obstructive pulmonary disease with (acute) exacerbation: Secondary | ICD-10-CM | POA: Insufficient documentation

## 2013-10-04 LAB — BASIC METABOLIC PANEL
Anion gap: 13 (ref 5–15)
BUN: 6 mg/dL (ref 6–23)
CO2: 26 mEq/L (ref 19–32)
Calcium: 9.1 mg/dL (ref 8.4–10.5)
Chloride: 99 mEq/L (ref 96–112)
Creatinine, Ser: 0.77 mg/dL (ref 0.50–1.35)
GFR calc Af Amer: >90 mL/min (ref >90)
GFR calc non Af Amer: >90 mL/min (ref >90)
Glucose, Bld: 121 mg/dL — ABNORMAL HIGH (ref 70–99)
Potassium: 4.1 mEq/L (ref 3.7–5.3)
Sodium: 138 mEq/L (ref 137–147)

## 2013-10-04 LAB — CBC WITH DIFFERENTIAL/PLATELET
Basophils Absolute: 0 10*3/uL (ref 0.0–0.1)
Basophils Relative: 1 % (ref 0–1)
Eosinophils Absolute: 0.2 10*3/uL (ref 0.0–0.7)
Eosinophils Relative: 2 % (ref 0–5)
HCT: 44.6 % (ref 39.0–52.0)
Hemoglobin: 15.6 g/dL (ref 13.0–17.0)
Lymphocytes Relative: 19 % (ref 12–46)
Lymphs Abs: 1.6 10*3/uL (ref 0.7–4.0)
MCH: 31.8 pg (ref 26.0–34.0)
MCHC: 35 g/dL (ref 30.0–36.0)
MCV: 91 fL (ref 78.0–100.0)
Monocytes Absolute: 1.2 10*3/uL — ABNORMAL HIGH (ref 0.1–1.0)
Monocytes Relative: 15 % — ABNORMAL HIGH (ref 3–12)
Neutro Abs: 5.1 10*3/uL (ref 1.7–7.7)
Neutrophils Relative %: 63 % (ref 43–77)
Platelets: 242 10*3/uL (ref 150–400)
RBC: 4.9 MIL/uL (ref 4.22–5.81)
RDW: 12.8 % (ref 11.5–15.5)
WBC: 7.9 10*3/uL (ref 4.0–10.5)

## 2013-10-04 MED ORDER — PREDNISONE 50 MG PO TABS: ORAL_TABLET | ORAL | Status: DC

## 2013-10-04 MED ORDER — METHYLPREDNISOLONE SODIUM SUCC 125 MG IJ SOLR
125.0000 mg | Freq: Once | INTRAMUSCULAR | Status: AC
Start: 2013-10-04 — End: 2013-10-04
  Administered 2013-10-04: 125 mg via INTRAVENOUS
  Filled 2013-10-04: qty 2

## 2013-10-04 MED ORDER — IPRATROPIUM-ALBUTEROL 0.5-2.5 (3) MG/3ML IN SOLN
3.0000 mL | Freq: Once | RESPIRATORY_TRACT | Status: AC
  Administered 2013-10-04: 3 mL via RESPIRATORY_TRACT
  Filled 2013-10-04: qty 3

## 2013-10-04 MED ORDER — ALBUTEROL SULFATE (2.5 MG/3ML) 0.083% IN NEBU
2.5000 mg | INHALATION_SOLUTION | Freq: Once | RESPIRATORY_TRACT | Status: AC
  Administered 2013-10-04: 2.5 mg via RESPIRATORY_TRACT
  Filled 2013-10-04: qty 3

## 2013-10-04 MED ORDER — SODIUM CHLORIDE 0.9 % IV BOLUS (SEPSIS)
500.0000 mL | Freq: Once | INTRAVENOUS | Status: AC
  Administered 2013-10-04: 500 mL via INTRAVENOUS

## 2013-10-04 NOTE — ED Notes (Signed)
Pt states  Breathing treatment helped "some"

## 2013-10-04 NOTE — ED Provider Notes (Signed)
CSN: [id number]     Arrival date & time 10/04/13  3244 History  This chart was scribed for Nat Christen, MD by Erling Conte, ED Scribe. This patient was seen in room APA04/APA04 and the patient's care was started at 9:14 AM.     Chief Complaint  Patient presents with  . Shortness of Breath     The history is provided by the patient. No language interpreter was used.   HPI Comments: Shane Allison is a 48 y.o. male with a h/o COPD, HTN, psoriasis who presents to the Emergency Department complaining of  intermittent, moderate, gradually worsening, shortness of breath for 2 days. Patient states he has an associated non productive cough, subjective fever, and left sided chest tightness. Patient is current every day smoker, he smokes about 1 pack a day. He states he regularly uses a nebulizer machine at home. He states he has been using it at home to help with his SOB with no relief. He denies any congestion, rhinorrhea, congestion, chills, chest pain, sore throat, abdominal pain, nausea, or emesis.  PCP: Dr. Sinda Du    Past Medical History  Diagnosis Date  . Hypertension   . Psoriasis   . COPD (chronic obstructive pulmonary disease)    Past Surgical History  Procedure Laterality Date  . Hand tendon surgery    . Thumb amputation      Partial    History reviewed. No pertinent family history. History  Substance Use Topics  . Smoking status: Current Every Day Smoker -- 3.00 packs/day    Types: Cigarettes  . Smokeless tobacco: Not on file  . Alcohol Use: 0.0 oz/week     Comment: 6 pack/ day    Review of Systems  Constitutional: Positive for fever (subjective). Negative for chills.  HENT: Negative for congestion, rhinorrhea and sore throat.   Respiratory: Positive for cough (non productive), chest tightness and shortness of breath.   Cardiovascular: Negative for chest pain.  Gastrointestinal: Negative for nausea, vomiting and abdominal pain.  A complete 10 system review  of systems was obtained and all systems are negative except as noted in the HPI and PMH.      Allergies  Penicillins  Home Medications   Prior to Admission medications   Medication Sig Start Date End Date Taking? Authorizing Provider  predniSONE (DELTASONE) 50 MG tablet 1 tablet daily for 7 days, one half tablet daily for 7 days 10/04/13   Nat Christen, MD   Triage Vitals: BP 143/107  Pulse 94  Temp(Src) 97.9 F (36.6 C) (Oral)  Resp 24  Ht 5\' 7"  (1.702 m)  Wt 145 lb (65.772 kg)  BMI 22.71 kg/m2  SpO2 98%  Physical Exam  Nursing note and vitals reviewed. Constitutional: He is oriented to person, place, and time. He appears well-developed and well-nourished.  Dyspneic and tachypnea   HENT:  Head: Normocephalic and atraumatic.  Eyes: Conjunctivae and EOM are normal. Pupils are equal, round, and reactive to light.  Neck: Normal range of motion. Neck supple.  Cardiovascular: Normal rate, regular rhythm and normal heart sounds.   Pulmonary/Chest: Tachypnea noted. He has wheezes.  Bilateral expiatory wheezes  Abdominal: Soft. Bowel sounds are normal.  Musculoskeletal: Normal range of motion.  Neurological: He is alert and oriented to person, place, and time.  Skin: Skin is warm and dry.  Psychiatric: He has a normal mood and affect. His behavior is normal.    ED Course  Procedures (including critical care time)  DIAGNOSTIC STUDIES: Oxygen  Saturation is 98% on RA, normal by my interpretation.    COORDINATION OF CARE: 9:20 AM- Will order IV, steroid injection, albuterol nebulizer treatment, CXR, EKG and BMP and CBC. Pt advised of plan for treatment and pt agrees.  Results for orders placed during the hospital encounter of 57/32/20  BASIC METABOLIC PANEL      Result Value Ref Range   Sodium 138  137 - 147 mEq/L   Potassium 4.1  3.7 - 5.3 mEq/L   Chloride 99  96 - 112 mEq/L   CO2 26  19 - 32 mEq/L   Glucose, Bld 121 (*) 70 - 99 mg/dL   BUN 6  6 - 23 mg/dL   Creatinine,  Ser 0.77  0.50 - 1.35 mg/dL   Calcium 9.1  8.4 - 10.5 mg/dL   GFR calc non Af Amer >90  >90 mL/min   GFR calc Af Amer >90  >90 mL/min   Anion gap 13  5 - 15  CBC WITH DIFFERENTIAL      Result Value Ref Range   WBC 7.9  4.0 - 10.5 K/uL   RBC 4.90  4.22 - 5.81 MIL/uL   Hemoglobin 15.6  13.0 - 17.0 g/dL   HCT 44.6  39.0 - 52.0 %   MCV 91.0  78.0 - 100.0 fL   MCH 31.8  26.0 - 34.0 pg   MCHC 35.0  30.0 - 36.0 g/dL   RDW 12.8  11.5 - 15.5 %   Platelets 242  150 - 400 K/uL   Neutrophils Relative % 63  43 - 77 %   Neutro Abs 5.1  1.7 - 7.7 K/uL   Lymphocytes Relative 19  12 - 46 %   Lymphs Abs 1.6  0.7 - 4.0 K/uL   Monocytes Relative 15 (*) 3 - 12 %   Monocytes Absolute 1.2 (*) 0.1 - 1.0 K/uL   Eosinophils Relative 2  0 - 5 %   Eosinophils Absolute 0.2  0.0 - 0.7 K/uL   Basophils Relative 1  0 - 1 %   Basophils Absolute 0.0  0.0 - 0.1 K/uL      Labs Review Labs Reviewed  BASIC METABOLIC PANEL - Abnormal; Notable for the following:    Glucose, Bld 121 (*)    All other components within normal limits  CBC WITH DIFFERENTIAL - Abnormal; Notable for the following:    Monocytes Relative 15 (*)    Monocytes Absolute 1.2 (*)    All other components within normal limits    Imaging Review Dg Chest 2 View  10/04/2013   CLINICAL DATA:  48 year old with shortness of breath.  EXAM: CHEST  2 VIEW  COMPARISON:  06/02/2012  FINDINGS: Two views of the chest demonstrate hyperinflation. The lungs are clear bilaterally. Heart and mediastinum are within normal limits. Trachea is midline. Probable right cervical rib. No acute bone abnormality.  IMPRESSION: No active cardiopulmonary disease.   Electronically Signed   By: Markus Daft M.D.   On: 10/04/2013 10:21     EKG Interpretation   Date/Time:  Friday October 04 2013 09:09:20 EDT Ventricular Rate:  95 PR Interval:  155 QRS Duration: 86 QT Interval:  353 QTC Calculation: 444 R Axis:   84 Text Interpretation:  Sinus rhythm LAE, consider  biatrial enlargement  Confirmed by Ashley Montminy  MD, Ailyne Pawley ([id number]) on 10/04/2013 9:12:25 AM      MDM   Final diagnoses:  COPD exacerbation   Patient has profound COPD. Chest x-ray negative.  He feels better after Albuterol/Atrovent nebulizer treatment. IV Solu-Medrol.  Discharge medications prednisone. Stop smoking.  I personally performed the services described in this documentation, which was scribed in my presence. The recorded information has been reviewed and is accurate.     Nat Christen, MD 10/04/13 1251

## 2013-10-04 NOTE — ED Notes (Signed)
RT called

## 2013-10-04 NOTE — Discharge Instructions (Signed)
Chest x-ray showed no pneumonia. Stop smoking. Continue to use your breathing machine. Start prednisone. Return if worse

## 2013-10-04 NOTE — ED Notes (Signed)
Sob for 2 days.  Nonproductive cough.  C/o some left side chest pain that gets worse with his cough.

## 2014-03-03 ENCOUNTER — Emergency Department (HOSPITAL_COMMUNITY): Payer: Medicaid Other

## 2014-03-03 ENCOUNTER — Emergency Department (HOSPITAL_COMMUNITY)
Admission: EM | Admit: 2014-03-03 | Discharge: 2014-03-03 | Disposition: A | Discharge status: Home / Self Care | Payer: Medicaid Other | Attending: Emergency Medicine | Admitting: Emergency Medicine

## 2014-03-03 ENCOUNTER — Encounter (HOSPITAL_COMMUNITY): Payer: Self-pay | Admitting: *Deleted

## 2014-03-03 DIAGNOSIS — Z872 Personal history of diseases of the skin and subcutaneous tissue: Secondary | ICD-10-CM | POA: Insufficient documentation

## 2014-03-03 DIAGNOSIS — Z7952 Long term (current) use of systemic steroids: Secondary | ICD-10-CM | POA: Diagnosis not present

## 2014-03-03 DIAGNOSIS — J441 Chronic obstructive pulmonary disease with (acute) exacerbation: Secondary | ICD-10-CM

## 2014-03-03 DIAGNOSIS — Z720 Tobacco use: Secondary | ICD-10-CM | POA: Insufficient documentation

## 2014-03-03 DIAGNOSIS — I10 Essential (primary) hypertension: Secondary | ICD-10-CM | POA: Insufficient documentation

## 2014-03-03 DIAGNOSIS — R05 Cough: Secondary | ICD-10-CM

## 2014-03-03 DIAGNOSIS — R059 Cough, unspecified: Secondary | ICD-10-CM

## 2014-03-03 DIAGNOSIS — R0602 Shortness of breath: Secondary | ICD-10-CM | POA: Diagnosis present

## 2014-03-03 DIAGNOSIS — Z880 Allergy status to penicillin: Secondary | ICD-10-CM | POA: Insufficient documentation

## 2014-03-03 LAB — COMPREHENSIVE METABOLIC PANEL
ALT: 16 U/L (ref 0–53)
AST: 18 U/L (ref 0–37)
Albumin: 4 g/dL (ref 3.5–5.2)
Alkaline Phosphatase: 76 U/L (ref 39–117)
Anion gap: 11 (ref 5–15)
BUN: 9 mg/dL (ref 6–23)
CO2: 23 mmol/L (ref 19–32)
Calcium: 9.2 mg/dL (ref 8.4–10.5)
Chloride: 104 mEq/L (ref 96–112)
Creatinine, Ser: 0.76 mg/dL (ref 0.50–1.35)
GFR calc Af Amer: >90 mL/min (ref >90)
GFR calc non Af Amer: >90 mL/min (ref >90)
Glucose, Bld: 78 mg/dL (ref 70–99)
Potassium: 4.1 mmol/L (ref 3.5–5.1)
Sodium: 138 mmol/L (ref 135–145)
Total Bilirubin: 0.5 mg/dL (ref 0.3–1.2)
Total Protein: 8.1 g/dL (ref 6.0–8.3)

## 2014-03-03 LAB — CBC WITH DIFFERENTIAL/PLATELET
Basophils Absolute: 0.1 10*3/uL (ref 0.0–0.1)
Basophils Relative: 1 % (ref 0–1)
Eosinophils Absolute: 0.1 10*3/uL (ref 0.0–0.7)
Eosinophils Relative: 1 % (ref 0–5)
HCT: 43.2 % (ref 39.0–52.0)
Hemoglobin: 14.5 g/dL (ref 13.0–17.0)
Lymphocytes Relative: 26 % (ref 12–46)
Lymphs Abs: 3.4 10*3/uL (ref 0.7–4.0)
MCH: 30.1 pg (ref 26.0–34.0)
MCHC: 33.6 g/dL (ref 30.0–36.0)
MCV: 89.6 fL (ref 78.0–100.0)
Monocytes Absolute: 0.7 10*3/uL (ref 0.1–1.0)
Monocytes Relative: 5 % (ref 3–12)
Neutro Abs: 8.8 10*3/uL — ABNORMAL HIGH (ref 1.7–7.7)
Neutrophils Relative %: 67 % (ref 43–77)
Platelets: 401 10*3/uL — ABNORMAL HIGH (ref 150–400)
RBC: 4.82 MIL/uL (ref 4.22–5.81)
RDW: 12.5 % (ref 11.5–15.5)
WBC: 13.1 10*3/uL — ABNORMAL HIGH (ref 4.0–10.5)

## 2014-03-03 MED ORDER — PREDNISONE 50 MG PO TABS
50.0000 mg | ORAL_TABLET | Freq: Every day | ORAL | Status: DC
  Administered 2014-03-03: 50 mg via ORAL
  Filled 2014-03-03: qty 1

## 2014-03-03 MED ORDER — ALBUTEROL SULFATE HFA 108 (90 BASE) MCG/ACT IN AERS: 1.0000 | INHALATION_SPRAY | Freq: Four times a day (QID) | RESPIRATORY_TRACT | Status: DC | PRN

## 2014-03-03 MED ORDER — PREDNISONE 50 MG PO TABS: 50.0000 mg | ORAL_TABLET | Freq: Every day | ORAL | Status: DC

## 2014-03-03 MED ORDER — IPRATROPIUM-ALBUTEROL 0.5-2.5 (3) MG/3ML IN SOLN
3.0000 mL | Freq: Once | RESPIRATORY_TRACT | Status: AC
  Administered 2014-03-03: 3 mL via RESPIRATORY_TRACT
  Filled 2014-03-03: qty 3

## 2014-03-03 NOTE — ED Notes (Addendum)
Fever, cough, yellow sputum. For  Week,  Sob  Headache

## 2014-03-03 NOTE — Discharge Instructions (Signed)

## 2014-03-03 NOTE — ED Notes (Signed)
Patient given discharge instruction, verbalized understand. Patient ambulatory out of the department.  

## 2014-03-03 NOTE — ED Provider Notes (Signed)
CSN: [id number]     Arrival date & time 03/03/14  1328 History  This chart was scribed for [name], MD by Edison Simon, ED Scribe. This patient was seen in room [facility] and the patient's care was started at 4:44 PM.    Chief Complaint  Patient presents with  . Shortness of Breath   The history is provided by the patient. No language interpreter was used.    HPI Comments: Shane Allison is a 49 y.o. male smoker with history of COPD who presents to the Emergency Department complaining of difficulty breathing with onset 9 days ago. He reports associated chest congestion, sinus discomfort, sneezing, sore throat, and cough that produces thick, yellow-green sputum. He notes his cough stopped being productive this morning. He states that he smells something like propone when breathing through his nose. He states he has used inhaler and nebulizer at home without improvement. He denies fever or chills.  Past Medical History  Diagnosis Date  . Hypertension   . Psoriasis   . COPD (chronic obstructive pulmonary disease)    Past Surgical History  Procedure Laterality Date  . Hand tendon surgery    . Thumb amputation      Partial    History reviewed. No pertinent family history. History  Substance Use Topics  . Smoking status: Current Every Day Smoker -- 3.00 packs/day    Types: Cigarettes  . Smokeless tobacco: Not on file  . Alcohol Use: 0.0 oz/week     Comment: 6 pack/ day    Review of Systems  Constitutional: Negative for fever and chills.  HENT: Positive for congestion, sneezing and sore throat.   Respiratory: Positive for cough and shortness of breath.   All other systems reviewed and are negative.     Allergies  Penicillins  Home Medications   Prior to Admission medications   Medication Sig Start Date End Date Taking? Authorizing Provider  predniSONE (DELTASONE) 50 MG tablet 1 tablet daily for 7 days, one half tablet daily for 7 days 10/04/13   Nat Christen, MD   BP  143/102 mmHg  Pulse 109  Temp(Src) 98.8 F (37.1 C) (Oral)  Ht 5\' 6"  (1.676 m)  Wt 145 lb (65.772 kg)  BMI 23.41 kg/m2  SpO2 96% Physical Exam  Constitutional: He appears well-developed and well-nourished. No distress.  HENT:  Head: Normocephalic and atraumatic.  Right Ear: Tympanic membrane and external ear normal.  Left Ear: Tympanic membrane and external ear normal.  Mouth/Throat: Posterior oropharyngeal erythema present. No oropharyngeal exudate.  Eyes: Conjunctivae are normal. Right eye exhibits no discharge. Left eye exhibits no discharge. No scleral icterus.  Neck: Neck supple. No tracheal deviation present.  Cardiovascular: Normal rate, regular rhythm and intact distal pulses.   Pulmonary/Chest: Effort normal. No accessory muscle usage or stridor. No respiratory distress. He has wheezes. He has no rales.  Abdominal: Soft. Bowel sounds are normal. He exhibits no distension. There is no tenderness. There is no rebound and no guarding.  Musculoskeletal: He exhibits no edema or tenderness.  Neurological: He is alert. He has normal strength. No cranial nerve deficit (no facial droop, extraocular movements intact, no slurred speech) or sensory deficit. He exhibits normal muscle tone. He displays no seizure activity. Coordination normal.  Skin: Skin is warm and dry. No rash noted.  Psychiatric: He has a normal mood and affect.  Nursing note and vitals reviewed.   ED Course  Procedures (including critical care time)   DIAGNOSTIC STUDIES: Oxygen Saturation is  96% on room air, normal by my interpretation.    COORDINATION OF CARE: 4:49 PM Discussed with patient that his x-ray shows no evidence for pneumonia. Discussed treatment plan with patient at beside, including breathing treatment and discharge with prescription for steroids if his blood count is benign. The patient agrees with the plan and has no further questions at this time.   Labs Review Labs Reviewed  CBC WITH  DIFFERENTIAL  COMPREHENSIVE METABOLIC PANEL    Imaging Review Dg Chest 2 View  03/03/2014   CLINICAL DATA:  Cough, fever.  EXAM: CHEST  2 VIEW  COMPARISON:  October 04, 2013.  FINDINGS: The heart size and mediastinal contours are within normal limits. Both lungs are clear. No pneumothorax or pleural effusion is noted. The visualized skeletal structures are unremarkable.  IMPRESSION: No acute cardiopulmonary abnormality seen.   Electronically Signed   By: Sabino Dick M.D.   On: 03/03/2014 14:05      MDM   Final diagnoses:  Cough    Pt with mild bronchitis, copd exacerbation.  Breathing easily in the ED.  Will dc home with oral steroids.  He has albuterol neb.   Follow up with PCP  I personally performed the services described in this documentation, which was scribed in my presence.  The recorded information has been reviewed and is accurate.    [name], MD 03/03/14 [phone]

## 2014-09-21 ENCOUNTER — Emergency Department (HOSPITAL_COMMUNITY)
Admission: EM | Admit: 2014-09-21 | Discharge: 2014-09-21 | Disposition: A | Discharge status: Home / Self Care | Payer: Medicaid Other | Attending: Emergency Medicine | Admitting: Emergency Medicine

## 2014-09-21 ENCOUNTER — Encounter (HOSPITAL_COMMUNITY): Payer: Self-pay | Admitting: *Deleted

## 2014-09-21 DIAGNOSIS — Z720 Tobacco use: Secondary | ICD-10-CM | POA: Insufficient documentation

## 2014-09-21 DIAGNOSIS — L237 Allergic contact dermatitis due to plants, except food: Secondary | ICD-10-CM | POA: Diagnosis not present

## 2014-09-21 DIAGNOSIS — Z880 Allergy status to penicillin: Secondary | ICD-10-CM | POA: Insufficient documentation

## 2014-09-21 DIAGNOSIS — I10 Essential (primary) hypertension: Secondary | ICD-10-CM | POA: Insufficient documentation

## 2014-09-21 DIAGNOSIS — Z79899 Other long term (current) drug therapy: Secondary | ICD-10-CM | POA: Insufficient documentation

## 2014-09-21 DIAGNOSIS — J449 Chronic obstructive pulmonary disease, unspecified: Secondary | ICD-10-CM | POA: Diagnosis not present

## 2014-09-21 DIAGNOSIS — R21 Rash and other nonspecific skin eruption: Secondary | ICD-10-CM | POA: Diagnosis present

## 2014-09-21 MED ORDER — PREDNISONE 50 MG PO TABS: 50.0000 mg | ORAL_TABLET | Freq: Every day | ORAL | Status: DC

## 2014-09-21 MED ORDER — DIPHENHYDRAMINE HCL 25 MG PO TABS: 25.0000 mg | ORAL_TABLET | ORAL | Status: DC | PRN

## 2014-09-21 MED ORDER — PREDNISONE 50 MG PO TABS
60.0000 mg | ORAL_TABLET | Freq: Once | ORAL | Status: AC
  Administered 2014-09-21: 60 mg via ORAL
  Filled 2014-09-21 (×2): qty 1

## 2014-09-21 MED ORDER — TRAMADOL HCL 50 MG PO TABS: 50.0000 mg | ORAL_TABLET | Freq: Four times a day (QID) | ORAL | Status: DC | PRN

## 2014-09-21 MED ORDER — LORATADINE 10 MG PO TABS
10.0000 mg | ORAL_TABLET | Freq: Once | ORAL | Status: AC
  Administered 2014-09-21: 10 mg via ORAL
  Filled 2014-09-21: qty 1

## 2014-09-21 MED ORDER — LORATADINE 10 MG PO TABS: 10.0000 mg | ORAL_TABLET | Freq: Every day | ORAL | Status: DC

## 2014-09-21 NOTE — ED Notes (Signed)
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

## 2014-09-21 NOTE — Discharge Instructions (Signed)
Poison [employer] ivy is a inflammation of the skin (contact dermatitis) caused by touching the allergens on the leaves of the ivy plant following previous exposure to the plant. The rash usually appears 48 hours after exposure. The rash is usually bumps (papules) or blisters (vesicles) in a linear pattern. Depending on your own sensitivity, the rash may simply cause redness and itching, or it may also progress to blisters which may break open. These must be well cared for to prevent secondary bacterial (germ) infection, followed by scarring. Keep any open areas dry, clean, dressed, and covered with an antibacterial ointment if needed. The eyes may also get puffy. The puffiness is worst in the morning and gets better as the day progresses. This dermatitis usually heals without scarring, within 2 to 3 weeks without treatment. HOME CARE INSTRUCTIONS  Thoroughly wash with soap and water as soon as you have been exposed to poison ivy. You have about one half hour to remove the plant resin before it will cause the rash. This washing will destroy the oil or antigen on the skin that is causing, or will cause, the rash. Be sure to wash under your fingernails as any plant resin there will continue to spread the rash. Do not rub skin vigorously when washing affected area. Poison ivy cannot spread if no oil from the plant remains on your body. A rash that has progressed to weeping sores will not spread the rash unless you have not washed thoroughly. It is also important to wash any clothes you have been wearing as these may carry active allergens. The rash will return if you wear the unwashed clothing, even several days later. Avoidance of the plant in the future is the best measure. Poison ivy plant can be recognized by the number of leaves. Generally, poison ivy has three leaves with flowering branches on a single stem. Diphenhydramine may be purchased over the counter and used as needed for itching. Do not drive with  this medication if it makes you drowsy.Ask your caregiver about medication for children. SEEK MEDICAL CARE IF:  Open sores develop.  Redness spreads beyond area of rash.  You notice purulent (pus-like) discharge.  You have increased pain.  Other signs of infection develop (such as fever). Document Released: 02/05/2000 Document Revised: 05/02/2011 Document Reviewed: 07/18/2008 [facility] Patient Information 2015 [place], [place]. This information is not intended to replace advice given to you by your health care provider. Make sure you discuss any questions you have with your health care provider.  Prednisone tablets What is this medicine? PREDNISONE (PRED ni sone) is a corticosteroid. It is commonly used to treat inflammation of the skin, joints, lungs, and other organs. Common conditions treated include asthma, allergies, and arthritis. It is also used for other conditions, such as blood disorders and diseases of the adrenal glands. This medicine may be used for other purposes; ask your health care provider or pharmacist if you have questions. COMMON BRAND NAME(S): Deltasone, Predone, Sterapred, Sterapred DS What should I tell my health care provider before I take this medicine? They need to know if you have any of these conditions: -Cushing's syndrome -diabetes -glaucoma -heart disease -high blood pressure -infection (especially a virus infection such as chickenpox, cold sores, or herpes) -kidney disease -liver disease -mental illness -myasthenia gravis -osteoporosis -seizures -stomach or intestine problems -thyroid disease -an unusual or allergic reaction to lactose, prednisone, other medicines, foods, dyes, or preservatives -pregnant or trying to get pregnant -breast-feeding How should I use this medicine?  Take this medicine by mouth with a glass of water. Follow the directions on the prescription label. Take this medicine with food. If you are taking this medicine once a  day, take it in the morning. Do not take more medicine than you are told to take. Do not suddenly stop taking your medicine because you may develop a severe reaction. Your doctor will tell you how much medicine to take. If your doctor wants you to stop the medicine, the dose may be slowly lowered over time to avoid any side effects. Talk to your pediatrician regarding the use of this medicine in children. Special care may be needed. Overdosage: If you think you have taken too much of this medicine contact a poison control center or emergency room at once. NOTE: This medicine is only for you. Do not share this medicine with others. What if I miss a dose? If you miss a dose, take it as soon as you can. If it is almost time for your next dose, talk to your doctor or health care professional. You may need to miss a dose or take an extra dose. Do not take double or extra doses without advice. What may interact with this medicine? Do not take this medicine with any of the following medications: -metyrapone -mifepristone This medicine may also interact with the following medications: -aminoglutethimide -amphotericin B -aspirin and aspirin-like medicines -barbiturates -certain medicines for diabetes, like glipizide or glyburide -cholestyramine -cholinesterase inhibitors -cyclosporine -digoxin -diuretics -ephedrine -male hormones, like estrogens and birth control pills -isoniazid -ketoconazole -NSAIDS, medicines for pain and inflammation, like ibuprofen or naproxen -phenytoin -rifampin -toxoids -vaccines -warfarin This list may not describe all possible interactions. Give your health care provider a list of all the medicines, herbs, non-prescription drugs, or dietary supplements you use. Also tell them if you smoke, drink alcohol, or use illegal drugs. Some items may interact with your medicine. What should I watch for while using this medicine? Visit your doctor or health care professional  for regular checks on your progress. If you are taking this medicine over a prolonged period, carry an identification card with your name and address, the type and dose of your medicine, and your doctor's name and address. This medicine may increase your risk of getting an infection. Tell your doctor or health care professional if you are around anyone with measles or chickenpox, or if you develop sores or blisters that do not heal properly. If you are going to have surgery, tell your doctor or health care professional that you have taken this medicine within the last twelve months. Ask your doctor or health care professional about your diet. You may need to lower the amount of salt you eat. This medicine may affect blood sugar levels. If you have diabetes, check with your doctor or health care professional before you change your diet or the dose of your diabetic medicine. What side effects may I notice from receiving this medicine? Side effects that you should report to your doctor or health care professional as soon as possible: -allergic reactions like skin rash, itching or hives, swelling of the face, lips, or tongue -changes in emotions or moods -changes in vision -depressed mood -eye pain -fever or chills, cough, sore throat, pain or difficulty passing urine -increased thirst -swelling of ankles, feet Side effects that usually do not require medical attention (report to your doctor or health care professional if they continue or are bothersome): -confusion, excitement, restlessness -headache -nausea, vomiting -skin problems, acne, thin and  shiny skin -trouble sleeping -weight gain This list may not describe all possible side effects. Call your doctor for medical advice about side effects. You may report side effects to FDA at 1-800-FDA-1088. Where should I keep my medicine? Keep out of the reach of children. Store at room temperature between 15 and 30 degrees C (59 and 86 degrees F).  Protect from light. Keep container tightly closed. Throw away any unused medicine after the expiration date. NOTE: This sheet is a summary. It may not cover all possible information. If you have questions about this medicine, talk to your doctor, pharmacist, or health care provider.  2015, [employer] Standard. (2010-09-23 10:57:14)  Loratadine tablets What is this medicine? LORATADINE (lor AT a deen) is an antihistamine. It helps to relieve sneezing, runny nose, and itchy, watery eyes. This medicine is used to treat the symptoms of allergies. It is also used to treat itchy skin rash and hives. This medicine may be used for other purposes; ask your health care provider or pharmacist if you have questions. COMMON BRAND NAME(S): Alavert, Allergy Relief, Claritin, Claritin Hives Relief, Clear-Atadine, QlearQuil All Day & All Night Allergy Relief, Tavist ND What should I tell my health care provider before I take this medicine? They need to know if you have any of these conditions: -asthma -kidney disease -liver disease -an unusual or allergic reaction to loratadine, other antihistamines, other medicines, foods, dyes, or preservatives -pregnant or trying to get pregnant -breast-feeding How should I use this medicine? Take this medicine by mouth with a glass of water. Follow the directions on the label. You may take this medicine with food or on an empty stomach. Take your medicine at regular intervals. Do not take your medicine more often than directed. Talk to your pediatrician regarding the use of this medicine in children. While this medicine may be used in children as young as 6 years for selected conditions, precautions do apply. Overdosage: If you think you have taken too much of this medicine contact a poison control center or emergency room at once. NOTE: This medicine is only for you. Do not share this medicine with others. What if I miss a dose? If you miss a dose, take it as soon as  you can. If it is almost time for your next dose, take only that dose. Do not take double or extra doses. What may interact with this medicine? -other medicines for colds or allergies This list may not describe all possible interactions. Give your health care provider a list of all the medicines, herbs, non-prescription drugs, or dietary supplements you use. Also tell them if you smoke, drink alcohol, or use illegal drugs. Some items may interact with your medicine. What should I watch for while using this medicine? Tell your doctor or healthcare professional if your symptoms do not start to get better or if they get worse. Your mouth may get dry. Chewing sugarless gum or sucking hard candy, and drinking plenty of water may help. Contact your doctor if the problem does not go away or is severe. You may get drowsy or dizzy. Do not drive, use machinery, or do anything that needs mental alertness until you know how this medicine affects you. Do not stand or sit up quickly, especially if you are an older patient. This reduces the risk of dizzy or fainting spells. What side effects may I notice from receiving this medicine? Side effects that you should report to your doctor or health care professional as soon as  possible: -allergic reactions like skin rash, itching or hives, swelling of the face, lips, or tongue -breathing problems -unusually restless or nervous Side effects that usually do not require medical attention (report to your doctor or health care professional if they continue or are bothersome): -drowsiness -dry or irritated mouth or throat -headache This list may not describe all possible side effects. Call your doctor for medical advice about side effects. You may report side effects to FDA at 1-800-FDA-1088. Where should I keep my medicine? Keep out of the reach of children. Store at room temperature between 2 and 30 degrees C (36 and 86 degrees F). Protect from moisture. Throw away any  unused medicine after the expiration date. NOTE: This sheet is a summary. It may not cover all possible information. If you have questions about this medicine, talk to your doctor, pharmacist, or health care provider.  2015, [employer] Standard. (2007-08-13 17:17:24)  Diphenhydramine capsules or tablets What is this medicine? DIPHENHYDRAMINE (dye fen HYE dra meen) is an antihistamine. It is used to treat the symptoms of an allergic reaction. It is also used to treat Parkinson's disease. This medicine is also used to prevent and to treat motion sickness and as a nighttime sleep aid. This medicine may be used for other purposes; ask your health care provider or pharmacist if you have questions. COMMON BRAND NAME(S): Alka-Seltzer Plus Allergy, Banophen, Benadryl Allergy, Benadryl Allergy Dye Free, Benadryl Allergy Kapgel, Benadryl Allergy Ultratab, Diphedryl, Diphenhist, Genahist, PHARBEDRYL, Q-Dryl, Gretta Began, Valu-Dryl, Vicks ZzzQuil Nightime Sleep-Aid What should I tell my health care provider before I take this medicine? They need to know if you have any of these conditions: -asthma or lung disease -glaucoma -high blood pressure or heart disease -liver disease -pain or difficulty passing urine -prostate trouble -ulcers or other stomach problems -an unusual or allergic reaction to diphenhydramine, other medicines foods, dyes, or preservatives such as sulfites -pregnant or trying to get pregnant -breast-feeding How should I use this medicine? Take this medicine by mouth with a full glass of water. Follow the directions on the prescription label. Take your doses at regular intervals. Do not take your medicine more often than directed. To prevent motion sickness start taking this medicine 30 to 60 minutes before you leave. Talk to your pediatrician regarding the use of this medicine in children. Special care may be needed. Patients over 73 years old may have a stronger reaction and need a  smaller dose. Overdosage: If you think you have taken too much of this medicine contact a poison control center or emergency room at once. NOTE: This medicine is only for you. Do not share this medicine with others. What if I miss a dose? If you miss a dose, take it as soon as you can. If it is almost time for your next dose, take only that dose. Do not take double or extra doses. What may interact with this medicine? Do not take this medicine with any of the following medications: -MAOIs like Carbex, Eldepryl, Marplan, Nardil, and Parnate This medicine may also interact with the following medications: -alcohol -barbiturates, like phenobarbital -medicines for bladder spasm like oxybutynin, tolterodine -medicines for blood pressure -medicines for depression, anxiety, or psychotic disturbances -medicines for movement abnormalities or Parkinson's disease -medicines for sleep -other medicines for cold, cough or allergy -some medicines for the stomach like chlordiazepoxide, dicyclomine This list may not describe all possible interactions. Give your health care provider a list of all the medicines, herbs, non-prescription drugs, or dietary supplements you  use. Also tell them if you smoke, drink alcohol, or use illegal drugs. Some items may interact with your medicine. What should I watch for while using this medicine? Visit your doctor or health care professional for regular check ups. Tell your doctor if your symptoms do not improve or if they get worse. Your mouth may get dry. Chewing sugarless gum or sucking hard candy, and drinking plenty of water may help. Contact your doctor if the problem does not go away or is severe. This medicine may cause dry eyes and blurred vision. If you wear contact lenses you may feel some discomfort. Lubricating drops may help. See your eye doctor if the problem does not go away or is severe. You may get drowsy or dizzy. Do not drive, use machinery, or do anything  that needs mental alertness until you know how this medicine affects you. Do not stand or sit up quickly, especially if you are an older patient. This reduces the risk of dizzy or fainting spells. Alcohol may interfere with the effect of this medicine. Avoid alcoholic drinks. What side effects may I notice from receiving this medicine? Side effects that you should report to your doctor or health care professional as soon as possible: -allergic reactions like skin rash, itching or hives, swelling of the face, lips, or tongue -changes in vision -confused, agitated, nervous -irregular or fast heartbeat -tremor -trouble passing urine -unusual bleeding or bruising -unusually weak or tired Side effects that usually do not require medical attention (report to your doctor or health care professional if they continue or are bothersome): -constipation, diarrhea -drowsy -headache -loss of appetite -stomach upset, vomiting -thick mucous This list may not describe all possible side effects. Call your doctor for medical advice about side effects. You may report side effects to FDA at 1-800-FDA-1088. Where should I keep my medicine? Keep out of the reach of children. Store at room temperature between 15 and 30 degrees C (59 and 86 degrees F). Keep container closed tightly. Throw away any unused medicine after the expiration date. NOTE: This sheet is a summary. It may not cover all possible information. If you have questions about this medicine, talk to your doctor, pharmacist, or health care provider.  2015, [employer] Standard. (2007-05-28 17:06:22)  Tramadol tablets What is this medicine? TRAMADOL (TRA ma dole) is a pain reliever. It is used to treat moderate to severe pain in adults. This medicine may be used for other purposes; ask your health care provider or pharmacist if you have questions. COMMON BRAND NAME(S): Ultram What should I tell my health care provider before I take this  medicine? They need to know if you have any of these conditions: -brain tumor -depression -drug abuse or addiction -head injury -if you frequently drink alcohol containing drinks -kidney disease or trouble passing urine -liver disease -lung disease, asthma, or breathing problems -seizures or epilepsy -suicidal thoughts, plans, or attempt; a previous suicide attempt by you or a family member -an unusual or allergic reaction to tramadol, codeine, other medicines, foods, dyes, or preservatives -pregnant or trying to get pregnant -breast-feeding How should I use this medicine? Take this medicine by mouth with a full glass of water. Follow the directions on the prescription label. If the medicine upsets your stomach, take it with food or milk. Do not take more medicine than you are told to take. Talk to your pediatrician regarding the use of this medicine in children. Special care may be needed. Overdosage: If you think you have  taken too much of this medicine contact a poison control center or emergency room at once. NOTE: This medicine is only for you. Do not share this medicine with others. What if I miss a dose? If you miss a dose, take it as soon as you can. If it is almost time for your next dose, take only that dose. Do not take double or extra doses. What may interact with this medicine? Do not take this medicine with any of the following medications: -MAOIs like Carbex, Eldepryl, Marplan, Nardil, and Parnate This medicine may also interact with the following medications: -alcohol or medicines that contain alcohol -antihistamines -benzodiazepines -bupropion -carbamazepine or oxcarbazepine -clozapine -cyclobenzaprine -digoxin -furazolidone -linezolid -medicines for depression, anxiety, or psychotic disturbances -medicines for migraine headache like almotriptan, eletriptan, frovatriptan, naratriptan, rizatriptan, sumatriptan, zolmitriptan -medicines for pain like pentazocine,  buprenorphine, butorphanol, meperidine, nalbuphine, and propoxyphene -medicines for sleep -muscle relaxants -naltrexone -phenobarbital -phenothiazines like perphenazine, thioridazine, chlorpromazine, mesoridazine, fluphenazine, prochlorperazine, promazine, and trifluoperazine -procarbazine -warfarin This list may not describe all possible interactions. Give your health care provider a list of all the medicines, herbs, non-prescription drugs, or dietary supplements you use. Also tell them if you smoke, drink alcohol, or use illegal drugs. Some items may interact with your medicine. What should I watch for while using this medicine? Tell your doctor or health care professional if your pain does not go away, if it gets worse, or if you have new or a different type of pain. You may develop tolerance to the medicine. Tolerance means that you will need a higher dose of the medicine for pain relief. Tolerance is normal and is expected if you take this medicine for a long time. Do not suddenly stop taking your medicine because you may develop a severe reaction. Your body becomes used to the medicine. This does NOT mean you are addicted. Addiction is a behavior related to getting and using a drug for a non-medical reason. If you have pain, you have a medical reason to take pain medicine. Your doctor will tell you how much medicine to take. If your doctor wants you to stop the medicine, the dose will be slowly lowered over time to avoid any side effects. You may get drowsy or dizzy. Do not drive, use machinery, or do anything that needs mental alertness until you know how this medicine affects you. Do not stand or sit up quickly, especially if you are an older patient. This reduces the risk of dizzy or fainting spells. Alcohol can increase or decrease the effects of this medicine. Avoid alcoholic drinks. You may have constipation. Try to have a bowel movement at least every 2 to 3 days. If you do not have a bowel  movement for 3 days, call your doctor or health care professional. Your mouth may get dry. Chewing sugarless gum or sucking hard candy, and drinking plenty of water may help. Contact your doctor if the problem does not go away or is severe. What side effects may I notice from receiving this medicine? Side effects that you should report to your doctor or health care professional as soon as possible: -allergic reactions like skin rash, itching or hives, swelling of the face, lips, or tongue -breathing difficulties, wheezing -confusion -itching -light headedness or fainting spells -redness, blistering, peeling or loosening of the skin, including inside the mouth -seizures Side effects that usually do not require medical attention (report to your doctor or health care professional if they continue or are bothersome): -constipation -dizziness -drowsiness -  headache -nausea, vomiting This list may not describe all possible side effects. Call your doctor for medical advice about side effects. You may report side effects to FDA at 1-800-FDA-1088. Where should I keep my medicine? Keep out of the reach of children. Store at room temperature between 15 and 30 degrees C (59 and 86 degrees F). Keep container tightly closed. Throw away any unused medicine after the expiration date. NOTE: This sheet is a summary. It may not cover all possible information. If you have questions about this medicine, talk to your doctor, pharmacist, or health care provider.  2015, [employer]. (2009-10-21 11:55:44)

## 2014-09-21 NOTE — ED Provider Notes (Signed)
CSN: [id number]     Arrival date & time 09/21/14  0614 History   First MD Initiated Contact with Patient 09/21/14 [phone]     Chief Complaint  Patient presents with  . Rash     (Consider location/radiation/quality/duration/timing/severity/associated sxs/prior Treatment) Patient is a 49 y.o. male presenting with rash. The history is provided by the patient.  Rash He has been clean out a trailer, and started having problems with itching and blistery rashes 2 days ago. The worst part is in his scalp where it has been draining. Rash is present over his arms and legs. Addition to itching, there is a burning pain which he rates it at 8/10. He denies any difficulty breathing or swallowing. He thought that there may been a problem with dogs having been in the trailer.  Past Medical History  Diagnosis Date  . Hypertension   . Psoriasis   . COPD (chronic obstructive pulmonary disease)    Past Surgical History  Procedure Laterality Date  . Hand tendon surgery    . Thumb amputation      Partial    No family history on file. History  Substance Use Topics  . Smoking status: Current Every Day Smoker -- 3.00 packs/day    Types: Cigarettes  . Smokeless tobacco: Not on file  . Alcohol Use: 0.0 oz/week     Comment: 6 pack/ day    Review of Systems  Skin: Positive for rash.  All other systems reviewed and are negative.     Allergies  Penicillins  Home Medications   Prior to Admission medications   Medication Sig Start Date End Date Taking? Authorizing Provider  albuterol (PROVENTIL HFA;VENTOLIN HFA) 108 (90 BASE) MCG/ACT inhaler Inhale 1-2 puffs into the lungs every 6 (six) hours as needed for wheezing or shortness of breath. 03/03/14  Yes [name], MD  predniSONE (DELTASONE) 50 MG tablet Take 1 tablet (50 mg total) by mouth daily with breakfast. Take 1 ta 03/03/14   [name], MD   BP 127/97 mmHg  Pulse 93  Temp(Src) 98 F (36.7 C) (Oral)  Resp 16  Ht 5\' 6"  (1.676 m)  Wt 145 lb  (65.772 kg)  BMI 23.41 kg/m2  SpO2 97% Physical Exam  Nursing note and vitals reviewed.  49 year old male, resting comfortably and in no acute distress. Vital signs are significant for diastolic hypertension. Oxygen saturation is 97%, which is normal. Head is normocephalic and atraumatic. PERRLA, EOMI. Oropharynx is clear. Neck is nontender and supple without adenopathy or JVD. Back is nontender and there is no CVA tenderness. Lungs are clear without rales, wheezes, or rhonchi. Chest is nontender. Heart has regular rate and rhythm without murmur. Abdomen is soft, flat, nontender without masses or hepatosplenomegaly and peristalsis is normoactive. Extremities have no cyanosis or edema, full range of motion is present. Skin: There are numerous plaques on his arms and legs consistent with psoriasis. There is a vesicular rash on an erythematous space with vesicles in groupings consistent with poison ivy. Rash is noted over arms and legs. Scalp has numerous vesicles with moderate serous drainage. Neurologic: Mental status is normal, cranial nerves are intact, there are no motor or sensory deficits.  ED Course  Procedures (including critical care time)   MDM   Final diagnoses:  Poison ivy dermatitis    Poison ivy dermatitis. He is discharged with prescriptions for prednisone, loratadine, diphenhydramine, and tramadol for pain. Follow-up with PCP as needed.    Delora Fuel, MD 56/43/32 [id number]

## 2014-10-08 ENCOUNTER — Emergency Department (HOSPITAL_COMMUNITY)
Admission: EM | Admit: 2014-10-08 | Discharge: 2014-10-08 | Disposition: A | Discharge status: Home / Self Care | Payer: Medicaid Other | Attending: Emergency Medicine | Admitting: Emergency Medicine

## 2014-10-08 ENCOUNTER — Encounter (HOSPITAL_COMMUNITY): Payer: Self-pay | Admitting: *Deleted

## 2014-10-08 DIAGNOSIS — Z880 Allergy status to penicillin: Secondary | ICD-10-CM | POA: Diagnosis not present

## 2014-10-08 DIAGNOSIS — J449 Chronic obstructive pulmonary disease, unspecified: Secondary | ICD-10-CM | POA: Diagnosis not present

## 2014-10-08 DIAGNOSIS — T814XXD Infection following a procedure, subsequent encounter: Secondary | ICD-10-CM | POA: Diagnosis not present

## 2014-10-08 DIAGNOSIS — Z79899 Other long term (current) drug therapy: Secondary | ICD-10-CM | POA: Insufficient documentation

## 2014-10-08 DIAGNOSIS — Z720 Tobacco use: Secondary | ICD-10-CM | POA: Diagnosis not present

## 2014-10-08 DIAGNOSIS — R21 Rash and other nonspecific skin eruption: Secondary | ICD-10-CM | POA: Diagnosis present

## 2014-10-08 DIAGNOSIS — Y838 Other surgical procedures as the cause of abnormal reaction of the patient, or of later complication, without mention of misadventure at the time of the procedure: Secondary | ICD-10-CM | POA: Insufficient documentation

## 2014-10-08 DIAGNOSIS — L409 Psoriasis, unspecified: Secondary | ICD-10-CM | POA: Insufficient documentation

## 2014-10-08 DIAGNOSIS — I10 Essential (primary) hypertension: Secondary | ICD-10-CM | POA: Diagnosis not present

## 2014-10-08 DIAGNOSIS — T798XXD Other early complications of trauma, subsequent encounter: Secondary | ICD-10-CM

## 2014-10-08 MED ORDER — DIPHENHYDRAMINE HCL 25 MG PO CAPS
50.0000 mg | ORAL_CAPSULE | Freq: Once | ORAL | Status: AC
  Administered 2014-10-08: 50 mg via ORAL
  Filled 2014-10-08: qty 2

## 2014-10-08 MED ORDER — SULFAMETHOXAZOLE-TRIMETHOPRIM 800-160 MG PO TABS
1.0000 | ORAL_TABLET | Freq: Once | ORAL | Status: AC
  Administered 2014-10-08: 1 via ORAL
  Filled 2014-10-08: qty 1

## 2014-10-08 MED ORDER — PREDNISONE 20 MG PO TABS: ORAL_TABLET | ORAL | Status: DC

## 2014-10-08 MED ORDER — SULFAMETHOXAZOLE-TRIMETHOPRIM 800-160 MG PO TABS: 1.0000 | ORAL_TABLET | Freq: Two times a day (BID) | ORAL | Status: AC

## 2014-10-08 MED ORDER — CETIRIZINE HCL 10 MG PO CAPS: 10.0000 mg | ORAL_CAPSULE | Freq: Every day | ORAL | Status: DC | PRN

## 2014-10-08 MED ORDER — TRAMADOL HCL 50 MG PO TABS
100.0000 mg | ORAL_TABLET | Freq: Once | ORAL | Status: AC
  Administered 2014-10-08: 100 mg via ORAL
  Filled 2014-10-08: qty 2

## 2014-10-08 MED ORDER — PREDNISONE 10 MG PO TABS
60.0000 mg | ORAL_TABLET | Freq: Once | ORAL | Status: AC
  Administered 2014-10-08: 60 mg via ORAL
  Filled 2014-10-08 (×2): qty 1

## 2014-10-08 MED ORDER — TRAMADOL HCL 50 MG PO TABS: ORAL_TABLET | ORAL | Status: DC

## 2014-10-08 NOTE — ED Notes (Signed)
Pt states he was seen here couple of weeks ago for poison ivy and given medications; pt states tonight he has rash and drainage from head and neck and states the rash has gotten worse and is cracking and bleeding

## 2014-10-08 NOTE — ED Provider Notes (Signed)
CSN: [id number]     Arrival date & time 10/08/14  0212 History   First MD Initiated Contact with Patient 10/08/14 0305   Chief Complaint  Patient presents with  . Rash     (Consider location/radiation/quality/duration/timing/severity/associated sxs/prior Treatment) HPI  Patient reports about 2 weeks ago he had a flareup of his psoriasis and also poison ivy. He was seen in the ED and reports he's not doing any better. He states he still having drainage from his scalp and it burns and hurts. He also complains of a lot of itching. He states he's never had a flareup like this before. He denies any fevers.   PCP Dr Luan Pulling  Past Medical History  Diagnosis Date  . Hypertension   . Psoriasis   . COPD (chronic obstructive pulmonary disease)    Past Surgical History  Procedure Laterality Date  . Hand tendon surgery    . Thumb amputation      Partial    History reviewed. No pertinent family history. Social History  Substance Use Topics  . Smoking status: Current Every Day Smoker -- 3.00 packs/day    Types: Cigarettes  . Smokeless tobacco: None  . Alcohol Use: 0.0 oz/week     Comment: 6 pack/ day  unemployed Smokes 3-4 ppd  Review of Systems  All other systems reviewed and are negative.     Allergies  Penicillins  Home Medications   Prior to Admission medications   Medication Sig Start Date End Date Taking? Authorizing Provider  albuterol (PROVENTIL HFA;VENTOLIN HFA) 108 (90 BASE) MCG/ACT inhaler Inhale 1-2 puffs into the lungs every 6 (six) hours as needed for wheezing or shortness of breath. 03/03/14   [name], MD  Cetirizine HCl (ZYRTEC ALLERGY) 10 MG CAPS Take 1 capsule (10 mg total) by mouth daily as needed (for itching). 10/08/14   Rolland Porter, MD  diphenhydrAMINE (BENADRYL) 25 MG tablet Take 1 tablet (25 mg total) by mouth every 4 (four) hours as needed for itching. 0/25/42   Delora Fuel, MD  loratadine (CLARITIN) 10 MG tablet Take 1 tablet (10 mg total) by mouth  daily. 08/26/21   Delora Fuel, MD  predniSONE (DELTASONE) 20 MG tablet Take 3 po QD x 3d , then 2 po QD x 3d then 1 po QD x 3d 10/08/14   Rolland Porter, MD  sulfamethoxazole-trimethoprim (BACTRIM DS,SEPTRA DS) 800-160 MG per tablet Take 1 tablet by mouth 2 (two) times daily. 10/08/14 10/15/14  Rolland Porter, MD  traMADol (ULTRAM) 50 MG tablet Take 1 or 2 po Q 6hrs for pain 10/08/14   Rolland Porter, MD   BP 161/107 mmHg  Pulse 96  Temp(Src) 97.5 F (36.4 C) (Oral)  Resp 18  Ht 5\' 7"  (1.702 m)  Wt 145 lb (65.772 kg)  BMI 22.71 kg/m2  SpO2 97%  Vital signs normal except for hypertension  Physical Exam  Constitutional: He is oriented to person, place, and time.  Non-toxic appearance. He does not appear ill. He appears distressed.  Small thin male  HENT:  Head: Normocephalic and atraumatic.  Right Ear: External ear normal.  Left Ear: External ear normal.  Nose: Nose normal. No mucosal edema or rhinorrhea.  Mouth/Throat: Mucous membranes are normal. No dental abscesses or uvula swelling.  Eyes: Conjunctivae and EOM are normal.  Neck: Normal range of motion and full passive range of motion without pain.  Pulmonary/Chest: Effort normal. No respiratory distress. He has no rhonchi. He exhibits no crepitus.  Abdominal: Normal appearance.  Musculoskeletal:  Normal range of motion.  Moves all extremities well.   Neurological: He is alert and oriented to person, place, and time. He has normal strength. No cranial nerve deficit.  Skin: Skin is warm, dry and intact. Rash noted. No erythema. No pallor.  Patient has a few scattered lesions on his trunk that are round and raised with flakiness of skin in the center. These are consistent with psoriatic lesions. He also has some areas mainly on the inner aspect of his arms that are very red and with thickening of the skin and almost have the appearance of eczema. When I look at his back of his head he has a lot of hyperkeratosis along his neck with serous drainage of his  scalp with some swelling of the scalp and including the back of the ears.  Psychiatric: He has a normal mood and affect. His speech is normal and behavior is normal. His mood appears not anxious.  Nursing note and vitals reviewed.            ED Course  Procedures (including critical care time)  Medications  traMADol (ULTRAM) tablet 100 mg (not administered)  diphenhydrAMINE (BENADRYL) capsule 50 mg (50 mg Oral Given 10/08/14 0325)  sulfamethoxazole-trimethoprim (BACTRIM DS,SEPTRA DS) 800-160 MG per tablet 1 tablet (1 tablet Oral Given 10/08/14 0325)  predniSONE (DELTASONE) tablet 60 mg (60 mg Oral Given 10/08/14 0325)    Patient was discussed with the hospitalist. I was concerned he might need admission for IV antibiotic for his scalp infection. However he felt he could be discharged on oral medications.  Labs Review Labs Reviewed - No data to display  Imaging Review No results found. I have personally reviewed and evaluated these images and lab results as part of my medical decision-making.   EKG Interpretation None      MDM   Final diagnoses:  Wound infection, subsequent encounter  Psoriasis   New Prescriptions   CETIRIZINE HCL (ZYRTEC ALLERGY) 10 MG CAPS    Take 1 capsule (10 mg total) by mouth daily as needed (for itching).   PREDNISONE (DELTASONE) 20 MG TABLET    Take 3 po QD x 3d , then 2 po QD x 3d then 1 po QD x 3d   SULFAMETHOXAZOLE-TRIMETHOPRIM (BACTRIM DS,SEPTRA DS) 800-160 MG PER TABLET    Take 1 tablet by mouth 2 (two) times daily.   TRAMADOL (ULTRAM) 50 MG TABLET    Take 1 or 2 po Q 6hrs for pain    Plan discharge  Rolland Porter, MD, Barbette Or, MD 10/08/14 0430

## 2014-10-08 NOTE — Discharge Instructions (Signed)
Take the medications as directed. You have an infection in your scalp so the antibiotics should help with that.  Consider seeing a dermatologist to try to get your psoriasis under control.     Psoriasis Psoriasis is a long-lasting (chronic) skin problem. It can cause red bumps, a rash, scales that flake off, bleeding, and joint pain (arthritis). Psoriasis often affects the elbows, knees, groin, genitals, arms, legs, scalp, and nails. Psoriasis cannot be passed from person to person (not contagious).  HOME CARE  Only take medicine as told by your doctor.  Keep all doctor visits as told. You may need to try many treatments to find what works for you.  Avoid sunburn, cuts, scrapes, alcohol, smoking, and stress.  Wear gloves when you wash dishes, clean, or go outside in the cold.  Keep the air moist and cool in your home. You can use a humidifier.  Put lotion on right after a bath or shower.  Avoid long, hot baths and showers. Do not use a lot of soap.  Drink enough fluids to keep your pee (urine) clear or pale yellow. GET HELP RIGHT AWAY IF:   You have pain in the affected areas.  You have bleeding that does not stop in the affected areas.  You have more redness or warmth in the affected areas.  You have painful or stiff joints.  You feel depressed.  You have a fever. MAKE SURE YOU:  Understand these instructions.  Will watch your condition.  Will get help right away if you are not doing well or get worse. Document Released: 03/17/2004 Document Revised: 05/02/2011 Document Reviewed: 08/06/2010 [facility] Patient Information 2015 [place], [place]. This information is not intended to replace advice given to you by your health care provider. Make sure you discuss any questions you have with your health care provider.

## 2015-05-10 ENCOUNTER — Inpatient Hospital Stay (HOSPITAL_COMMUNITY)
Admission: EM | Admit: 2015-05-10 | Discharge: 2015-05-12 | DRG: 378 | Disposition: A | Discharge status: Home / Self Care | Payer: Medicaid Other | Attending: Pulmonary Disease | Admitting: Pulmonary Disease

## 2015-05-10 ENCOUNTER — Emergency Department (HOSPITAL_COMMUNITY): Payer: Medicaid Other

## 2015-05-10 ENCOUNTER — Encounter (HOSPITAL_COMMUNITY): Payer: Self-pay

## 2015-05-10 DIAGNOSIS — E860 Dehydration: Secondary | ICD-10-CM | POA: Diagnosis present

## 2015-05-10 DIAGNOSIS — R1011 Right upper quadrant pain: Secondary | ICD-10-CM | POA: Diagnosis present

## 2015-05-10 DIAGNOSIS — E871 Hypo-osmolality and hyponatremia: Secondary | ICD-10-CM | POA: Diagnosis present

## 2015-05-10 DIAGNOSIS — F1010 Alcohol abuse, uncomplicated: Secondary | ICD-10-CM | POA: Diagnosis present

## 2015-05-10 DIAGNOSIS — R55 Syncope and collapse: Secondary | ICD-10-CM | POA: Diagnosis not present

## 2015-05-10 DIAGNOSIS — R197 Diarrhea, unspecified: Secondary | ICD-10-CM

## 2015-05-10 DIAGNOSIS — K852 Alcohol induced acute pancreatitis without necrosis or infection: Secondary | ICD-10-CM

## 2015-05-10 DIAGNOSIS — K449 Diaphragmatic hernia without obstruction or gangrene: Secondary | ICD-10-CM | POA: Diagnosis present

## 2015-05-10 DIAGNOSIS — L409 Psoriasis, unspecified: Secondary | ICD-10-CM | POA: Diagnosis present

## 2015-05-10 DIAGNOSIS — F17210 Nicotine dependence, cigarettes, uncomplicated: Secondary | ICD-10-CM | POA: Diagnosis present

## 2015-05-10 DIAGNOSIS — Z7289 Other problems related to lifestyle: Secondary | ICD-10-CM

## 2015-05-10 DIAGNOSIS — J449 Chronic obstructive pulmonary disease, unspecified: Secondary | ICD-10-CM | POA: Diagnosis present

## 2015-05-10 DIAGNOSIS — R040 Epistaxis: Secondary | ICD-10-CM | POA: Diagnosis present

## 2015-05-10 DIAGNOSIS — S0081XA Abrasion of other part of head, initial encounter: Secondary | ICD-10-CM | POA: Insufficient documentation

## 2015-05-10 DIAGNOSIS — N179 Acute kidney failure, unspecified: Secondary | ICD-10-CM | POA: Diagnosis present

## 2015-05-10 DIAGNOSIS — K922 Gastrointestinal hemorrhage, unspecified: Secondary | ICD-10-CM

## 2015-05-10 DIAGNOSIS — K921 Melena: Principal | ICD-10-CM | POA: Diagnosis present

## 2015-05-10 DIAGNOSIS — K859 Acute pancreatitis without necrosis or infection, unspecified: Secondary | ICD-10-CM | POA: Diagnosis present

## 2015-05-10 DIAGNOSIS — K2970 Gastritis, unspecified, without bleeding: Secondary | ICD-10-CM | POA: Diagnosis present

## 2015-05-10 DIAGNOSIS — I10 Essential (primary) hypertension: Secondary | ICD-10-CM | POA: Diagnosis present

## 2015-05-10 DIAGNOSIS — Z789 Other specified health status: Secondary | ICD-10-CM

## 2015-05-10 HISTORY — DX: Reserved for inherently not codable concepts without codable children: IMO0001

## 2015-05-10 HISTORY — DX: Alcohol use, unspecified, uncomplicated: F10.90

## 2015-05-10 HISTORY — DX: Other specified health status: Z78.9

## 2015-05-10 HISTORY — DX: Radiculopathy, cervical region: M54.12

## 2015-05-10 HISTORY — DX: Acute kidney failure, unspecified: N17.9

## 2015-05-10 HISTORY — DX: Unspecified asthma, uncomplicated: J45.909

## 2015-05-10 HISTORY — DX: Cervicalgia: M54.2

## 2015-05-10 HISTORY — DX: Other problems related to lifestyle: Z72.89

## 2015-05-10 HISTORY — DX: Other chronic pain: G89.29

## 2015-05-10 HISTORY — DX: Gastrointestinal hemorrhage, unspecified: K92.2

## 2015-05-10 LAB — HEMOGLOBIN AND HEMATOCRIT, BLOOD
HCT: 36.2 % — ABNORMAL LOW (ref 39.0–52.0)
Hemoglobin: 12.7 g/dL — ABNORMAL LOW (ref 13.0–17.0)

## 2015-05-10 LAB — COMPREHENSIVE METABOLIC PANEL
ALT: 33 U/L (ref 17–63)
AST: 43 U/L — ABNORMAL HIGH (ref 15–41)
Albumin: 3.9 g/dL (ref 3.5–5.0)
Alkaline Phosphatase: 61 U/L (ref 38–126)
Anion gap: 8 (ref 5–15)
BUN: 24 mg/dL — ABNORMAL HIGH (ref 6–20)
CO2: 22 mmol/L (ref 22–32)
Calcium: 8.5 mg/dL — ABNORMAL LOW (ref 8.9–10.3)
Chloride: 96 mmol/L — ABNORMAL LOW (ref 101–111)
Creatinine, Ser: 1.45 mg/dL — ABNORMAL HIGH (ref 0.61–1.24)
GFR calc Af Amer: >60 mL/min (ref >60)
GFR calc non Af Amer: 56 mL/min — ABNORMAL LOW (ref >60)
Glucose, Bld: 129 mg/dL — ABNORMAL HIGH (ref 65–99)
Potassium: 4.7 mmol/L (ref 3.5–5.1)
Sodium: 126 mmol/L — ABNORMAL LOW (ref 135–145)
Total Bilirubin: 0.4 mg/dL (ref 0.3–1.2)
Total Protein: 7.2 g/dL (ref 6.5–8.1)

## 2015-05-10 LAB — URINALYSIS, ROUTINE W REFLEX MICROSCOPIC
Bilirubin Urine: NEGATIVE
Glucose, UA: NEGATIVE mg/dL
Hgb urine dipstick: NEGATIVE
Ketones, ur: NEGATIVE mg/dL
Leukocytes, UA: NEGATIVE
Nitrite: NEGATIVE
Protein, ur: NEGATIVE mg/dL
Specific Gravity, Urine: 1.015 (ref 1.005–1.030)
pH: 5 (ref 5.0–8.0)

## 2015-05-10 LAB — PROTIME-INR
INR: 0.9 (ref 0.00–1.49)
Prothrombin Time: 12.4 seconds (ref 11.6–15.2)

## 2015-05-10 LAB — TYPE AND SCREEN
ABO/RH(D): O NEG
Antibody Screen: NEGATIVE

## 2015-05-10 LAB — I-STAT CG4 LACTIC ACID, ED: Lactic Acid, Venous: 1.44 mmol/L (ref 0.5–2.0)

## 2015-05-10 LAB — RAPID URINE DRUG SCREEN, HOSP PERFORMED
Amphetamines: NOT DETECTED
Barbiturates: NOT DETECTED
Benzodiazepines: NOT DETECTED
Cocaine: NOT DETECTED
Opiates: POSITIVE — AB
Tetrahydrocannabinol: NOT DETECTED

## 2015-05-10 LAB — CBC
HCT: 42.1 % (ref 39.0–52.0)
Hemoglobin: 14.6 g/dL (ref 13.0–17.0)
MCH: 30.7 pg (ref 26.0–34.0)
MCHC: 34.7 g/dL (ref 30.0–36.0)
MCV: 88.4 fL (ref 78.0–100.0)
Platelets: 172 10*3/uL (ref 150–400)
RBC: 4.76 MIL/uL (ref 4.22–5.81)
RDW: 12.1 % (ref 11.5–15.5)
WBC: 3.9 10*3/uL — ABNORMAL LOW (ref 4.0–10.5)

## 2015-05-10 LAB — LIPASE, BLOOD: Lipase: 61 U/L — ABNORMAL HIGH (ref 11–51)

## 2015-05-10 LAB — ETHANOL: Alcohol, Ethyl (B): <5 mg/dL (ref <5)

## 2015-05-10 LAB — TROPONIN I: Troponin I: <0.03 ng/mL (ref <0.031)

## 2015-05-10 LAB — LACTIC ACID, PLASMA
Lactic Acid, Venous: 1 mmol/L (ref 0.5–2.0)
Lactic Acid, Venous: 1 mmol/L (ref 0.5–2.0)

## 2015-05-10 LAB — POC OCCULT BLOOD, ED: Fecal Occult Bld: POSITIVE — AB

## 2015-05-10 MED ORDER — ONDANSETRON HCL 4 MG/2ML IJ SOLN
4.0000 mg | Freq: Four times a day (QID) | INTRAMUSCULAR | Status: DC | PRN
  Filled 2015-05-10: qty 2

## 2015-05-10 MED ORDER — LORAZEPAM 2 MG/ML IJ SOLN
0.0000 mg | Freq: Four times a day (QID) | INTRAMUSCULAR | Status: DC
  Administered 2015-05-11: 2 mg via INTRAVENOUS
  Filled 2015-05-10: qty 1

## 2015-05-10 MED ORDER — FOLIC ACID 1 MG PO TABS
1.0000 mg | ORAL_TABLET | Freq: Every day | ORAL | Status: DC
Start: 2015-05-11 — End: 2015-05-12
  Administered 2015-05-11 – 2015-05-12 (×2): 1 mg via ORAL
  Filled 2015-05-10 (×2): qty 1

## 2015-05-10 MED ORDER — ACETAMINOPHEN 325 MG PO TABS
650.0000 mg | ORAL_TABLET | Freq: Once | ORAL | Status: AC
  Administered 2015-05-10: 650 mg via ORAL
  Filled 2015-05-10: qty 2

## 2015-05-10 MED ORDER — LORAZEPAM 1 MG PO TABS
1.0000 mg | ORAL_TABLET | Freq: Four times a day (QID) | ORAL | Status: DC | PRN
  Administered 2015-05-12: 1 mg via ORAL
  Filled 2015-05-10: qty 1

## 2015-05-10 MED ORDER — ONDANSETRON HCL 4 MG PO TABS: 4.0000 mg | ORAL_TABLET | Freq: Four times a day (QID) | ORAL | Status: DC | PRN

## 2015-05-10 MED ORDER — ADULT MULTIVITAMIN W/MINERALS CH
1.0000 | ORAL_TABLET | Freq: Every day | ORAL | Status: DC
  Administered 2015-05-11 – 2015-05-12 (×2): 1 via ORAL
  Filled 2015-05-10 (×2): qty 1

## 2015-05-10 MED ORDER — THIAMINE HCL 100 MG/ML IJ SOLN: 100.0000 mg | Freq: Every day | INTRAMUSCULAR | Status: DC

## 2015-05-10 MED ORDER — ALUM & MAG HYDROXIDE-SIMETH 200-200-20 MG/5ML PO SUSP: 30.0000 mL | Freq: Four times a day (QID) | ORAL | Status: DC | PRN

## 2015-05-10 MED ORDER — HYDROMORPHONE HCL 1 MG/ML IJ SOLN
0.5000 mg | INTRAMUSCULAR | Status: DC | PRN
  Administered 2015-05-11 (×2): 1 mg via INTRAVENOUS
  Filled 2015-05-10 (×2): qty 1

## 2015-05-10 MED ORDER — ACETAMINOPHEN 650 MG RE SUPP: 650.0000 mg | Freq: Four times a day (QID) | RECTAL | Status: DC | PRN

## 2015-05-10 MED ORDER — SODIUM CHLORIDE 0.9 % IV BOLUS (SEPSIS)
1000.0000 mL | Freq: Once | INTRAVENOUS | Status: AC
  Administered 2015-05-10: 1000 mL via INTRAVENOUS

## 2015-05-10 MED ORDER — SODIUM CHLORIDE 0.9 % IV SOLN
INTRAVENOUS | Status: DC
  Administered 2015-05-11 – 2015-05-12 (×4): via INTRAVENOUS

## 2015-05-10 MED ORDER — SODIUM CHLORIDE 0.9 % IV SOLN: INTRAVENOUS | Status: DC

## 2015-05-10 MED ORDER — LORAZEPAM 2 MG/ML IJ SOLN
0.0000 mg | Freq: Two times a day (BID) | INTRAMUSCULAR | Status: DC
Start: 2015-05-12 — End: 2015-05-12

## 2015-05-10 MED ORDER — ACETAMINOPHEN 325 MG PO TABS: 650.0000 mg | ORAL_TABLET | Freq: Four times a day (QID) | ORAL | Status: DC | PRN

## 2015-05-10 MED ORDER — VITAMIN B-1 100 MG PO TABS
100.0000 mg | ORAL_TABLET | Freq: Every day | ORAL | Status: DC
  Administered 2015-05-11 – 2015-05-12 (×3): 100 mg via ORAL
  Filled 2015-05-10 (×2): qty 1

## 2015-05-10 MED ORDER — LORAZEPAM 2 MG/ML IJ SOLN: 1.0000 mg | Freq: Four times a day (QID) | INTRAMUSCULAR | Status: DC | PRN

## 2015-05-10 MED ORDER — PANTOPRAZOLE SODIUM 40 MG IV SOLR
40.0000 mg | Freq: Two times a day (BID) | INTRAVENOUS | Status: DC
  Administered 2015-05-11: 40 mg via INTRAVENOUS
  Filled 2015-05-10: qty 40

## 2015-05-10 MED ORDER — OXYCODONE HCL 5 MG PO TABS
5.0000 mg | ORAL_TABLET | ORAL | Status: DC | PRN
  Administered 2015-05-11: 5 mg via ORAL
  Filled 2015-05-10: qty 1

## 2015-05-10 NOTE — H&P (Signed)
[facility] Admission History and Physical       Shane Allison [id number] DOB: 11/24/1965 DOA: 05/10/2015  Referring physician: EDP PCP: Alonza Bogus, MD  Specialists:   Chief Complaint: Passed Out  HPI: Shane Allison is a 50 y.o. male with a history of COPD, HTN and ETOH Abuse who presents to the ED with complaints of passing out this afternoon while he was sitting in a chair eating.  He reports that he woke up on the floor, and his mother called EMS.   He reports that he hit his forehead when he collapsed.     He also reports that he has had black stools for the past 4 days and RUQ ABD Pain.    He said that his last drink was 4 days ago.    In the ED a CT scan of the head was performed and was negative for acute findings.   An FOBT was found to be HEME positive, and his initial hemoglobin was 14.  He was placed on IVFs, and the CIWA protocol and referred for admission.       Review of Systems:  Constitutional: No Weight Loss, No Weight Gain, Night Sweats, Fevers, Chills, Dizziness, Light Headedness, Fatigue, or Generalized Weakness HEENT: No Headaches, Difficulty Swallowing,Tooth/Dental Problems,Sore Throat,  No Sneezing, Rhinitis, Ear Ache, Nasal Congestion, or Post Nasal Drip,  Cardio-vascular:  No Chest pain, Orthopnea, PND, Edema in Lower Extremities, Anasarca, Dizziness, Palpitations  Resp: No Dyspnea, No DOE, No Productive Cough, No Non-Productive Cough, No Hemoptysis, No Wheezing.    GI: No Heartburn, Indigestion, +Abdominal Pain, Nausea, Vomiting, Diarrhea, Constipation, Hematemesis, Hematochezia, +Melena, Change in Bowel Habits,  Loss of Appetite  GU: No Dysuria, No Change in Color of Urine, No Urgency or Urinary Frequency, No Flank pain.  Musculoskeletal: No Joint Pain or Swelling, No Decreased Range of Motion, No Back Pain.  Neurologic:  +Syncope, No Seizures, Muscle Weakness, Paresthesia, Vision Disturbance or Loss, No Diplopia, No Vertigo, No  Difficulty Walking,  Skin: No Rash or Lesions. Psych: No Change in Mood or Affect, No Depression or Anxiety, No Memory loss, No Confusion, or Hallucinations   Past Medical History  Diagnosis Date  . Hypertension   . Psoriasis   . COPD (chronic obstructive pulmonary disease) ([place])   . Chronic neck pain   . Cervical radiculopathy   . Alcohol use [facility])      Past Surgical History  Procedure Laterality Date  . Hand tendon surgery    . Thumb amputation      Partial       Prior to Admission medications   Medication Sig Start Date End Date Taking? Authorizing Provider  albuterol (PROVENTIL HFA;VENTOLIN HFA) 108 (90 BASE) MCG/ACT inhaler Inhale 1-2 puffs into the lungs every 6 (six) hours as needed for wheezing or shortness of breath. 03/03/14   [name], MD  Cetirizine HCl (ZYRTEC ALLERGY) 10 MG CAPS Take 1 capsule (10 mg total) by mouth daily as needed (for itching). 10/08/14   Rolland Porter, MD  diphenhydrAMINE (BENADRYL) 25 MG tablet Take 1 tablet (25 mg total) by mouth every 4 (four) hours as needed for itching. [id number]   Delora Fuel, MD  loratadine (CLARITIN) 10 MG tablet Take 1 tablet (10 mg total) by mouth daily. [id number]   Delora Fuel, MD  predniSONE (DELTASONE) 20 MG tablet Take 3 po QD x 3d , then 2 po QD x 3d then 1 po QD x 3d 10/08/14   Rolland Porter, MD  traMADol (ULTRAM) 50 MG tablet Take 1 or 2 po Q 6hrs for pain 10/08/14   Rolland Porter, MD     Allergies  Allergen Reactions  . Penicillins Rash    Has patient had a PCN reaction causing immediate rash, facial/tongue/throat swelling, SOB or lightheadedness with hypotension: Yes Has patient had a PCN reaction causing severe rash involving mucus membranes or skin necrosis: No Has patient had a PCN reaction that required hospitalization No Has patient had a PCN reaction occurring within the last 10 years: No If all of the above answers are "NO", then may proceed with Cephalosporin use.      Social History:  reports that he has been  smoking Cigarettes.  He has been smoking about 3.00 packs per day. He does not have any smokeless tobacco history on file. He reports that he drinks alcohol. He reports that he does not use illicit drugs.    No family history on file.     Physical Exam:  GEN:  Pleasant Well Nourished and Well Developed 50 y.o. male examined and in no acute distress; cooperative with exam Filed Vitals:   05/10/15 2000 05/10/15 2057 05/10/15 2110 05/10/15 2141  BP: 108/93 112/79 108/72 110/66  Pulse: 68 65 68 73  Temp:      TempSrc:      Resp: 18 18  23   Height:      Weight:      SpO2: 100% 100%  100%   Blood pressure 110/66, pulse 73, temperature 98.2 F (36.8 C), temperature source Oral, resp. rate 23, height 5\' 1"  (1.549 m), weight 65.772 kg (145 lb), SpO2 100 %. PSYCH: He is alert and oriented x4; does not appear anxious does not appear depressed; affect is normal HEENT: Normocephalic and Atraumatic, Mucous membranes pink; PERRLA; EOM intact; Fundi:  Benign;  No scleral icterus, Nares: Patent, Oropharynx: Clear, Fair Dentition,    Neck:  FROM, No Cervical Lymphadenopathy nor Thyromegaly or Carotid Bruit; No JVD; Breasts:: Not examined CHEST WALL: No tenderness CHEST: Normal respiration, clear to auscultation bilaterally HEART: Regular rate and rhythm; no murmurs rubs or gallops BACK: No kyphosis or scoliosis; No CVA tenderness ABDOMEN: Positive Bowel Sounds, Soft Non-Tender, No Rebound or Guarding; No Masses, No Organomegaly. Rectal Exam: Not done EXTREMITIES: No Cyanosis, Clubbing, or Edema; No Ulcerations. Genitalia: not examined PULSES: 2+ and symmetric SKIN: Normal hydration no rash or ulceration CNS:  Alert and Oriented x 4, No Focal Deficits Vascular: pulses palpable throughout    Labs on Admission:  Basic Metabolic Panel:  Recent Labs Lab 05/10/15 1829  NA 126*  K 4.7  CL 96*  CO2 22  GLUCOSE 129*  BUN 24*  CREATININE 1.45*  CALCIUM 8.5*   Liver Function Tests:  Recent  Labs Lab 05/10/15 1829  AST 43*  ALT 33  ALKPHOS 61  BILITOT 0.4  PROT 7.2  ALBUMIN 3.9    Recent Labs Lab 05/10/15 1829  LIPASE 61*   No results for input(s): AMMONIA in the last 168 hours. CBC:  Recent Labs Lab 05/10/15 1829  WBC 3.9*  HGB 14.6  HCT 42.1  MCV 88.4  PLT 172   Cardiac Enzymes:  Recent Labs Lab 05/10/15 1918  TROPONINI <0.03    BNP (last 3 results) No results for input(s): BNP in the last 8760 hours.  ProBNP (last 3 results) No results for input(s): PROBNP in the last 8760 hours.  CBG: No results for input(s): GLUCAP in the last 168 hours.  Radiological Exams  on Admission: Ct Abdomen Pelvis Wo Contrast  05/10/2015  CLINICAL DATA:  Right upper quadrant pain for 4 days with black diarrhea with nausea and vomiting EXAM: CT ABDOMEN AND PELVIS WITHOUT CONTRAST TECHNIQUE: Multidetector CT imaging of the abdomen and pelvis was performed following the standard protocol without IV contrast. COMPARISON:  10/09/2001 FINDINGS: Lower chest:  Negative Hepatobiliary: Negative Pancreas: Negative Spleen: Negative Adrenals/Urinary Tract: 2 mm nonobstructing stone lower pole right kidney. Adrenal glands and kidneys otherwise negative. Bladder negative. Stomach/Bowel: Appendix is normal. There is a nonobstructive bowel gas pattern. There are air-fluid levels throughout the large bowel into the rectum consistent with diarrhea type illness. The large bowel is otherwise negative. Small bowel is normal. Stomach is normal. There is fluid in the distal esophagus consistent with reflux. Vascular/Lymphatic: No significant adenopathy in the abdomen or pelvis. Mild aortic calcification. Reproductive: Negative Other: No ascites Musculoskeletal: No acute osseous abnormalities IMPRESSION: Findings consistent with diarrhea type illness with no specific abnormalities. Electronically Signed   By: Skipper Cliche M.D.   On: 05/10/2015 20:45   Dg Chest 2 View  05/10/2015  CLINICAL DATA:   Syncope. Abrasion along the right forehead. Right upper quadrant abdominal pain. EXAM: CHEST  2 VIEW COMPARISON:  03/03/2014 FINDINGS: Tapering of the peripheral pulmonary vasculature favors emphysema. Cardiac and mediastinal margins appear normal. The lungs appear otherwise clear. Right C7 cervical rib and partial left C7 cervical rib. IMPRESSION: 1. Emphysema. 2. Bilateral cervical ribs incidentally noted. Electronically Signed   By: Van Clines M.D.   On: 05/10/2015 20:25   Ct Head Wo Contrast  05/10/2015  CLINICAL DATA:  Syncope and fall. Right forehead abrasion. Initial encounter. EXAM: CT HEAD WITHOUT CONTRAST CT CERVICAL SPINE WITHOUT CONTRAST TECHNIQUE: Multidetector CT imaging of the head and cervical spine was performed following the standard protocol without intravenous contrast. Multiplanar CT image reconstructions of the cervical spine were also generated. COMPARISON:  Cervical spine MRI 05/29/2013 FINDINGS: CT HEAD FINDINGS Skull and Sinuses:Negative for fracture. Diffuse paranasal sinus mucosal thickening with low-density fluid levels in the left sphenoid and maxillary sinuses. Visualized orbits: Negative. Brain: Unremarkable. No evidence of acute infarction, hemorrhage, hydrocephalus, or mass lesion/mass effect. CT CERVICAL SPINE FINDINGS Negative for acute fracture or subluxation. No prevertebral edema. No gross cervical canal hematoma. Spondylosis and facet arthropathy. Multilevel foraminal stenosis, left more than right. Foraminal narrowing is particularly severe bilaterally at C2-3. 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 C1-2 alignment is stable from comparison MRI. Apical emphysema. IMPRESSION: 1. No evidence of acute intracranial or cervical spine injury. 2. Sinusitis with left sphenoid and maxillary fluid levels. 3. 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 Electronically Signed   By: Monte Fantasia M.D.   On: 05/10/2015 20:44   Ct Cervical Spine Wo Contrast  05/10/2015  CLINICAL DATA:  Syncope and fall. Right forehead abrasion. Initial encounter. EXAM: CT HEAD WITHOUT CONTRAST CT CERVICAL SPINE WITHOUT CONTRAST TECHNIQUE: Multidetector CT imaging of the head and cervical spine was performed following the standard protocol without intravenous contrast. Multiplanar CT image reconstructions of the cervical spine were also generated. COMPARISON:  Cervical spine MRI 05/29/2013 FINDINGS: CT HEAD FINDINGS Skull and Sinuses:Negative for fracture. Diffuse paranasal sinus mucosal thickening with low-density fluid levels in the left sphenoid and maxillary sinuses. Visualized orbits: Negative. Brain: Unremarkable. No evidence of acute infarction, hemorrhage, hydrocephalus, or mass lesion/mass effect. CT CERVICAL SPINE FINDINGS Negative for acute fracture or subluxation. No prevertebral edema. No gross  cervical canal hematoma. Spondylosis and facet arthropathy. Multilevel foraminal stenosis, left more than right. Foraminal narrowing is particularly severe bilaterally at C2-3. 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 C1-2 alignment is stable from comparison MRI. Apical emphysema. IMPRESSION: 1. No evidence of acute intracranial or cervical spine injury. 2. Sinusitis with left sphenoid and maxillary fluid levels. 3. 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 Electronically Signed   By: Monte Fantasia M.D.   On: 05/10/2015 20:44     EKG: Independently reviewed. Normal Sinus Rhythm rate = 69       Assessment/Plan:      50 y.o. male with  Principal Problem:    GI bleed    IV Protonix    Monitor H/Hs    GI consulted for AM   Active Problems:    Syncope and collapse    Cardiac Monitoring    Check Orthostatics            RUQ abdominal pain-     Pain control    Monitor LFTs      Acute  pancreatitis    Monitor Lipase Level      Hyponatremia    IVFs with NSS    Send Urine Eectrolytes, and Osm         Alcohol abuse    CIWA Protocol with IV Ativan    counselled        AKI (acute kidney injury) (HCC)    IVFs    Monitor BUN/Cr    COPD (chronic obstructive pulmonary disease) (HCC)    DuoNebs PRN    Monitor O2 sat    DVT Prophylaxis    SCDs    Code Status:     FULL CODE        Family Communication:   No Family Present    Disposition Plan:    Inpatient Status with Expected LOS 2-3 days        Time spent: 18 Minutes      Niki Cosman C [facility] Pager [phone]   If 7AM -7PM Please Contact the Day Rounding Team MD for [facility]  If 7PM-7AM, Please Contact Night-Floor Coverage  [email or web address] Password [email or web address] 05/10/2015, 9:50 PM     ADDENDUM:   Patient was seen and examined on 05/10/2015

## 2015-05-10 NOTE — ED Notes (Signed)
Patient brought in from home for syncopal episode. Patent states he was eating in a chair and woke up in floor. Abrasion noted to right forehead. Patient reports of RUQ pain x4 days with "black diarrhea."  States when he changes positions he feel like he is going to pass out.  Also complains of nausea and vomiting x1 episode post fall. Patient denies neck or back pain. Alert and oritented x4. NAD noted.

## 2015-05-10 NOTE — ED Provider Notes (Signed)
CSN: [id number]     Arrival date & time 05/10/15  1809 History   First MD Initiated Contact with Patient 05/10/15 1833     Chief Complaint  Patient presents with  . Fall  . Loss of Consciousness  . Melena  . Abdominal Pain      HPI Pt was seen at [place]. Per pt, c/o sudden onset and resolution of one episode of syncope that occurred PTA. Pt states he was sitting in a chair "then woke up on the floor." Pt states for the past 4 days he has had poor PO intake, and multiple intermittent episodes of "dark diarrhea" daily. Has been associated with right sided abd "dull pain," one episode of N/V today and generalized weakness. Denies rectal bleeding, no black or blood in emesis, no fevers, no back pain, no CP/SOB.    Past Medical History  Diagnosis Date  . Hypertension   . Psoriasis   . COPD (chronic obstructive pulmonary disease) ([place])   . Chronic neck pain   . Cervical radiculopathy   . Alcohol use [facility])    Past Surgical History  Procedure Laterality Date  . Hand tendon surgery    . Thumb amputation      Partial     Social History  Substance Use Topics  . Smoking status: Current Every Day Smoker -- 3.00 packs/day    Types: Cigarettes  . Smokeless tobacco: None  . Alcohol Use: 0.0 oz/week     Comment: 12 pack/ day    Review of Systems ROS: Statement: All systems negative except as marked or noted in the HPI; Constitutional: Negative for fever and chills. ; ; Eyes: Negative for eye pain, redness and discharge. ; ; ENMT: Negative for ear pain, hoarseness, nasal congestion, sinus pressure and sore throat. ; ; Cardiovascular: Negative for chest pain, palpitations, diaphoresis, dyspnea and peripheral edema. ; ; Respiratory: Negative for cough, wheezing and stridor. ; ; Gastrointestinal: +poor PO intake, N/V/D, abd pain, "black stools." Negative for hematemesis, jaundice. . ; ; Genitourinary: Negative for dysuria, flank pain and hematuria. ; ; Musculoskeletal: Negative for back pain and  neck pain. Negative for swelling and trauma.; ; Skin: +abrasion. Negative for pruritus, rash, blisters, bruising and skin lesion.; ; Neuro: Negative for headache, lightheadedness and neck stiffness. Negative for extremity weakness, paresthesias, involuntary movement, seizure and +syncope.      Allergies  Penicillins  Home Medications   Prior to Admission medications   Medication Sig Start Date End Date Taking? Authorizing Provider  albuterol (PROVENTIL HFA;VENTOLIN HFA) 108 (90 BASE) MCG/ACT inhaler Inhale 1-2 puffs into the lungs every 6 (six) hours as needed for wheezing or shortness of breath. 03/03/14   [name], MD  Cetirizine HCl (ZYRTEC ALLERGY) 10 MG CAPS Take 1 capsule (10 mg total) by mouth daily as needed (for itching). 10/08/14   Rolland Porter, MD  diphenhydrAMINE (BENADRYL) 25 MG tablet Take 1 tablet (25 mg total) by mouth every 4 (four) hours as needed for itching. [id number]   Delora Fuel, MD  loratadine (CLARITIN) 10 MG tablet Take 1 tablet (10 mg total) by mouth daily. [id number]   Delora Fuel, MD  predniSONE (DELTASONE) 20 MG tablet Take 3 po QD x 3d , then 2 po QD x 3d then 1 po QD x 3d 10/08/14   Rolland Porter, MD  traMADol (ULTRAM) 50 MG tablet Take 1 or 2 po Q 6hrs for pain 10/08/14   Rolland Porter, MD   BP 98/65 mmHg  Pulse 70  Temp(Src) 98.2 F (36.8 C) (Oral)  Resp 16  Ht 5\' 1"  (1.549 m)  Wt 145 lb (65.772 kg)  BMI 27.41 kg/m2  SpO2 100%   Patient Vitals for the past 24 hrs:  BP Temp Temp src Pulse Resp SpO2 Height Weight  05/10/15 2057 112/79 mmHg - - 65 18 100 % - -  05/10/15 2000 108/93 mmHg - - 68 18 100 % - -  05/10/15 1930 123/86 mmHg - - 66 17 100 % - -  05/10/15 1900 98/65 mmHg - - 70 16 100 % - -  05/10/15 1813 91/62 mmHg 98.2 F (36.8 C) Oral 77 16 100 % 5\' 1"  (1.549 m) 145 lb (65.772 kg)     Physical Exam  1845: Physical examination:  Nursing notes reviewed; Vital signs and O2 SAT reviewed;  Constitutional: Well developed, Well nourished, In no acute distress;  Head:  Normocephalic, +superficial abrasion left forehead.; Eyes: EOMI, PERRL, No scleral icterus; ENMT: Mouth and pharynx normal, Mucous membranes dry; Neck: Supple, Full range of motion, No lymphadenopathy; Cardiovascular: Regular rate and rhythm, No gallop; Respiratory: Breath sounds clear & equal bilaterally, No wheezes.  Speaking full sentences with ease, Normal respiratory effort/excursion; Chest: Nontender, Movement normal; Abdomen: Soft, +RUQ and RLQ tender to palp. No rebound or guarding. Nondistended, Normal bowel sounds. Rectal exam performed w/permission of pt and ED RN chaperone present.  Anal tone normal.  Non-tender, soft brown stool in rectal vault, heme positive.  No fissures, no external hemorrhoids, no palp masses.; Genitourinary: No CVA tenderness; Spine:  No midline CS, TS, LS tenderness.;; Extremities: Pulses normal, No tenderness, No edema, No calf edema or asymmetry.; Neuro: AA&Ox3, Major CN grossly intact.  Speech clear. No gross focal motor or sensory deficits in extremities.; Skin: Color normal, Warm, Dry. +diffuse psoriasis rash.    ED Course  Procedures (including critical care time) Labs Review   Imaging Review  I have personally reviewed and evaluated these images and lab results as part of my medical decision-making.   EKG Interpretation   Date/Time:  Sunday May 10 2015 18:39:22 EDT Ventricular Rate:  69 PR Interval:  147 QRS Duration: 92 QT Interval:  406 QTC Calculation: 435 R Axis:   92 Text Interpretation:  Sinus rhythm Consider right atrial enlargement  Borderline right axis deviation Baseline wander When compared with ECG of  10/04/2013 No significant change was found Confirmed by [facility]  MD,  Nunzio Cory [phone]) on 05/10/2015 7:17:13 PM      MDM  MDM Reviewed: previous chart, nursing note and vitals Reviewed previous: labs and ECG Interpretation: labs, ECG, x-ray and CT scan Total time providing critical care: 30-74 minutes. This excludes time  spent performing separately reportable procedures and services. Consults: admitting MD   CRITICAL CARE Performed by: Alfonzo Feller Total critical care time: 35 minutes Critical care time was exclusive of separately billable procedures and treating other patients. Critical care was necessary to treat or prevent imminent or life-threatening deterioration. Critical care was time spent personally by me on the following activities: development of treatment plan with patient and/or surrogate as well as nursing, discussions with consultants, evaluation of patient's response to treatment, examination of patient, obtaining history from patient or surrogate, ordering and performing treatments and interventions, ordering and review of laboratory studies, ordering and review of radiographic studies, pulse oximetry and re-evaluation of patient's condition.   Results for orders placed or performed during the hospital encounter of 05/10/15  Lipase, blood  Result Value Ref Range  Lipase 61 (H) 11 - 51 U/L  Comprehensive metabolic panel  Result Value Ref Range   Sodium 126 (L) 135 - 145 mmol/L   Potassium 4.7 3.5 - 5.1 mmol/L   Chloride 96 (L) 101 - 111 mmol/L   CO2 22 22 - 32 mmol/L   Glucose, Bld 129 (H) 65 - 99 mg/dL   BUN 24 (H) 6 - 20 mg/dL   Creatinine, Ser 1.45 (H) 0.61 - 1.24 mg/dL   Calcium 8.5 (L) 8.9 - 10.3 mg/dL   Total Protein 7.2 6.5 - 8.1 g/dL   Albumin 3.9 3.5 - 5.0 g/dL   AST 43 (H) 15 - 41 U/L   ALT 33 17 - 63 U/L   Alkaline Phosphatase 61 38 - 126 U/L   Total Bilirubin 0.4 0.3 - 1.2 mg/dL   GFR calc non Af Amer 56 (L) >60 mL/min   GFR calc Af Amer >60 >60 mL/min   Anion gap 8 5 - 15  CBC  Result Value Ref Range   WBC 3.9 (L) 4.0 - 10.5 K/uL   RBC 4.76 4.22 - 5.81 MIL/uL   Hemoglobin 14.6 13.0 - 17.0 g/dL   HCT 42.1 39.0 - 52.0 %   MCV 88.4 78.0 - 100.0 fL   MCH 30.7 26.0 - 34.0 pg   MCHC 34.7 30.0 - 36.0 g/dL   RDW 12.1 11.5 - 15.5 %   Platelets 172 150 - 400 K/uL   Ethanol  Result Value Ref Range   Alcohol, Ethyl (B) <5 <5 mg/dL  Troponin I  Result Value Ref Range   Troponin I <0.03 <0.031 ng/mL  Protime-INR  Result Value Ref Range   Prothrombin Time 12.4 11.6 - 15.2 seconds   INR 0.90 0.00 - 1.49  Lactic acid, plasma  Result Value Ref Range   Lactic Acid, Venous 1.0 0.5 - 2.0 mmol/L  POC occult blood, ED  Result Value Ref Range   Fecal Occult Bld POSITIVE (A) NEGATIVE  I-Stat CG4 Lactic Acid, ED  Result Value Ref Range   Lactic Acid, Venous 1.44 0.5 - 2.0 mmol/L   Ct Abdomen Pelvis Wo Contrast 05/10/2015  CLINICAL DATA:  Right upper quadrant pain for 4 days with black diarrhea with nausea and vomiting EXAM: CT ABDOMEN AND PELVIS WITHOUT CONTRAST TECHNIQUE: Multidetector CT imaging of the abdomen and pelvis was performed following the standard protocol without IV contrast. COMPARISON:  10/09/2001 FINDINGS: Lower chest:  Negative Hepatobiliary: Negative Pancreas: Negative Spleen: Negative Adrenals/Urinary Tract: 2 mm nonobstructing stone lower pole right kidney. Adrenal glands and kidneys otherwise negative. Bladder negative. Stomach/Bowel: Appendix is normal. There is a nonobstructive bowel gas pattern. There are air-fluid levels throughout the large bowel into the rectum consistent with diarrhea type illness. The large bowel is otherwise negative. Small bowel is normal. Stomach is normal. There is fluid in the distal esophagus consistent with reflux. Vascular/Lymphatic: No significant adenopathy in the abdomen or pelvis. Mild aortic calcification. Reproductive: Negative Other: No ascites Musculoskeletal: No acute osseous abnormalities IMPRESSION: Findings consistent with diarrhea type illness with no specific abnormalities. Electronically Signed   By: Skipper Cliche M.D.   On: 05/10/2015 20:45   Dg Chest 2 View 05/10/2015  CLINICAL DATA:  Syncope. Abrasion along the right forehead. Right upper quadrant abdominal pain. EXAM: CHEST  2 VIEW COMPARISON:   03/03/2014 FINDINGS: Tapering of the peripheral pulmonary vasculature favors emphysema. Cardiac and mediastinal margins appear normal. The lungs appear otherwise clear. Right C7 cervical rib and partial left C7 cervical  rib. IMPRESSION: 1. Emphysema. 2. Bilateral cervical ribs incidentally noted. Electronically Signed   By: Van Clines M.D.   On: 05/10/2015 20:25   Ct Head Wo Contrast 05/10/2015  CLINICAL DATA:  Syncope and fall. Right forehead abrasion. Initial encounter. EXAM: CT HEAD WITHOUT CONTRAST CT CERVICAL SPINE WITHOUT CONTRAST TECHNIQUE: Multidetector CT imaging of the head and cervical spine was performed following the standard protocol without intravenous contrast. Multiplanar CT image reconstructions of the cervical spine were also generated. COMPARISON:  Cervical spine MRI 05/29/2013 FINDINGS: CT HEAD FINDINGS Skull and Sinuses:Negative for fracture. Diffuse paranasal sinus mucosal thickening with low-density fluid levels in the left sphenoid and maxillary sinuses. Visualized orbits: Negative. Brain: Unremarkable. No evidence of acute infarction, hemorrhage, hydrocephalus, or mass lesion/mass effect. CT CERVICAL SPINE FINDINGS Negative for acute fracture or subluxation. No prevertebral edema. No gross cervical canal hematoma. Spondylosis and facet arthropathy. Multilevel foraminal stenosis, left more than right. Foraminal narrowing is particularly severe bilaterally at C2-3. 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 C1-2 alignment is stable from comparison MRI. Apical emphysema. IMPRESSION: 1. No evidence of acute intracranial or cervical spine injury. 2. Sinusitis with left sphenoid and maxillary fluid levels. 3. 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 Electronically Signed   By: Monte Fantasia M.D.   On: 05/10/2015 20:44   Ct Cervical Spine Wo Contrast 05/10/2015  CLINICAL DATA:  Syncope and fall.  Right forehead abrasion. Initial encounter. EXAM: CT HEAD WITHOUT CONTRAST CT CERVICAL SPINE WITHOUT CONTRAST TECHNIQUE: Multidetector CT imaging of the head and cervical spine was performed following the standard protocol without intravenous contrast. Multiplanar CT image reconstructions of the cervical spine were also generated. COMPARISON:  Cervical spine MRI 05/29/2013 FINDINGS: CT HEAD FINDINGS Skull and Sinuses:Negative for fracture. Diffuse paranasal sinus mucosal thickening with low-density fluid levels in the left sphenoid and maxillary sinuses. Visualized orbits: Negative. Brain: Unremarkable. No evidence of acute infarction, hemorrhage, hydrocephalus, or mass lesion/mass effect. CT CERVICAL SPINE FINDINGS Negative for acute fracture or subluxation. No prevertebral edema. No gross cervical canal hematoma. Spondylosis and facet arthropathy. Multilevel foraminal stenosis, left more than right. Foraminal narrowing is particularly severe bilaterally at C2-3. 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 C1-2 alignment is stable from comparison MRI. Apical emphysema. IMPRESSION: 1. No evidence of acute intracranial or cervical spine injury. 2. Sinusitis with left sphenoid and maxillary fluid levels. 3. 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 Electronically Signed   By: Monte Fantasia M.D.   On: 05/10/2015 20:44   Results for MEGAN, YEE (MRN [id number]) as of 05/10/2015 20:49  Ref. Range 10/04/2013 09:33 03/03/2014 16:01 05/10/2015 18:29  Sodium Latest Ref Range: 135-145 mmol/L 138 138 126 (L)  BUN Latest Ref Range: 6-20 mg/dL 6 9 24  (H)  Creatinine Latest Ref Range: 0.61-1.24 mg/dL 0.77 0.76 1.45 (H)    2150:  IVF given for SBP 90's with slow improvement. No stooling while in the ED. Stool heme positive on exam. New hyponatremia on labs with BUN/Cr elevated from baseline; IVF continues. CT A/P reassuring. CIWA  score elevated, but due to pt c/o "headache" and is requesting tylenol. Pt denies etoh withdrawal symptoms, and he denies hx of etoh withdrawal symptoms. Does not appear tremulous at this time; will tx for headache and continue to monitor.  Dx and testing d/w pt.  Questions answered.  Verb understanding, agreeable to admit. T/C to [facility] Dr. Arnoldo Morale, case  discussed, including:  HPI, pertinent PM/SHx, VS/PE, dx testing, ED course and treatment:  Agreeable to admit, requests to write temporary orders, obtain tele bed to Dr. Luan Pulling' service.   Francine Graven, DO 05/13/15 [phone]

## 2015-05-11 ENCOUNTER — Encounter (HOSPITAL_COMMUNITY): Payer: Self-pay | Admitting: Gastroenterology

## 2015-05-11 LAB — CBC
HCT: 37.1 % — ABNORMAL LOW (ref 39.0–52.0)
Hemoglobin: 12.8 g/dL — ABNORMAL LOW (ref 13.0–17.0)
MCH: 30.5 pg (ref 26.0–34.0)
MCHC: 34.5 g/dL (ref 30.0–36.0)
MCV: 88.5 fL (ref 78.0–100.0)
Platelets: 170 10*3/uL (ref 150–400)
RBC: 4.19 MIL/uL — ABNORMAL LOW (ref 4.22–5.81)
RDW: 12.2 % (ref 11.5–15.5)
WBC: 3.3 10*3/uL — ABNORMAL LOW (ref 4.0–10.5)

## 2015-05-11 LAB — NA AND K (SODIUM & POTASSIUM), RAND UR
Potassium Urine: 15 mmol/L
Sodium, Ur: 44 mmol/L

## 2015-05-11 LAB — HEMOGLOBIN AND HEMATOCRIT, BLOOD
HCT: 36.2 % — ABNORMAL LOW (ref 39.0–52.0)
Hemoglobin: 12.6 g/dL — ABNORMAL LOW (ref 13.0–17.0)

## 2015-05-11 LAB — BASIC METABOLIC PANEL
Anion gap: 6 (ref 5–15)
BUN: 18 mg/dL (ref 6–20)
CO2: 19 mmol/L — ABNORMAL LOW (ref 22–32)
Calcium: 7.7 mg/dL — ABNORMAL LOW (ref 8.9–10.3)
Chloride: 105 mmol/L (ref 101–111)
Creatinine, Ser: 0.9 mg/dL (ref 0.61–1.24)
GFR calc Af Amer: >60 mL/min (ref >60)
GFR calc non Af Amer: >60 mL/min (ref >60)
Glucose, Bld: 84 mg/dL (ref 65–99)
Potassium: 4.3 mmol/L (ref 3.5–5.1)
Sodium: 130 mmol/L — ABNORMAL LOW (ref 135–145)

## 2015-05-11 LAB — LIPASE, BLOOD: Lipase: 54 U/L — ABNORMAL HIGH (ref 11–51)

## 2015-05-11 LAB — OSMOLALITY, URINE: Osmolality, Ur: 263 mOsm/kg — ABNORMAL LOW (ref 300–900)

## 2015-05-11 MED ORDER — PANTOPRAZOLE SODIUM 40 MG PO TBEC
40.0000 mg | DELAYED_RELEASE_TABLET | Freq: Two times a day (BID) | ORAL | Status: DC
  Administered 2015-05-11 – 2015-05-12 (×3): 40 mg via ORAL
  Filled 2015-05-11 (×2): qty 1

## 2015-05-11 MED ORDER — CHLORDIAZEPOXIDE HCL 25 MG PO CAPS
50.0000 mg | ORAL_CAPSULE | Freq: Four times a day (QID) | ORAL | Status: AC
  Administered 2015-05-11 – 2015-05-12 (×3): 50 mg via ORAL
  Filled 2015-05-11 (×3): qty 2

## 2015-05-11 NOTE — Consult Note (Signed)
Referring Provider: Dr. Luan Pulling  Primary Care Physician:  Dr. Luan Pulling Primary Gastroenterologist:  Dr. Oneida Alar   Date of Admission: 05/10/15 Date of Consultation: 05/11/15  Reason for Consultation:  Possible melena   HPI:  Shane Allison is a 50 y.o. year old male presenting to the ED with syncopal episode, black stools, and RUQ abdominal pain. Found to be in AKI. GI consulted due to heme positive stool, melena. Lipase was very marginally elevated but not clinically indicative of pancreatitis. CT abdomen without contrast with findings consistent with diarrheal illness otherwise no specific abnormalities. CT head without acute findings.   Noted 4 day history of tarry, black, sticky stool prior to admission. This morning had more watery, black stool. States starting out, he would have 10 or more loose stool. Every time taking a swallow, would go to bathroom. Thinks he went to the bathroom 10 times yesterday, black stool. Only once today. No Pepto or Imodium. No recent antibiotics. Every now and then will take ibuprofen. Takes hydrocodone for shoulder pain. No aspirin powders. Drinks a 6 pack a day, go a day without drinking, then drink a 12 pack. Usually 5 days out of the week. Has a 31 month old daughter. Cut back since he got full custody of her. Pain in RUQ for four days. No nausea or vomiting. No fever or chills. No dysphagia. No prior endoscopy or colonoscopy. Had clear liquids this morning. Only 1 loose stool this morning. Seems to be tapering off. Hgb stable at 12.8. Admitting Hgb 14.6. Takes methotrexate once a week for psoriasis.   Past Medical History  Diagnosis Date  . Hypertension   . Psoriasis   . COPD (chronic obstructive pulmonary disease) ([place])   . Chronic neck pain   . Cervical radiculopathy   . Alcohol use [facility])     Past Surgical History  Procedure Laterality Date  . Hand tendon surgery    . Thumb amputation      Partial left thumb     Prior to Admission medications    Medication Sig Start Date End Date Taking? Authorizing Provider  folic acid (FOLVITE) 1 MG tablet Take 1 mg by mouth daily. 04/09/15  Yes Historical Provider, MD  lisinopril (PRINIVIL,ZESTRIL) 10 MG tablet Take 10 mg by mouth daily.   Yes Historical Provider, MD  methotrexate (RHEUMATREX) 2.5 MG tablet Take 5 mg by mouth once a week. saturday 04/09/15  Yes Historical Provider, MD    Current Facility-Administered Medications  Medication Dose Route Frequency Provider Last Rate Last Dose  . 0.9 %  sodium chloride infusion   Intravenous Continuous Theressa Millard, MD 100 mL/hr at 05/11/15 0038    . acetaminophen (TYLENOL) tablet 650 mg  650 mg Oral Q6H PRN Theressa Millard, MD       Or  . acetaminophen (TYLENOL) suppository 650 mg  650 mg Rectal Q6H PRN Theressa Millard, MD      . alum & mag hydroxide-simeth (MAALOX/MYLANTA) 200-200-20 MG/5ML suspension 30 mL  30 mL Oral Q6H PRN Theressa Millard, MD      . folic acid (FOLVITE) tablet 1 mg  1 mg Oral Daily Theressa Millard, MD   1 mg at 05/11/15 0950  . HYDROmorphone (DILAUDID) injection 0.5-1 mg  0.5-1 mg Intravenous Q3H PRN Theressa Millard, MD   1 mg at 05/11/15 0114  . LORazepam (ATIVAN) injection 0-4 mg  0-4 mg Intravenous Q6H Theressa Millard, MD   2 mg at 05/11/15 [phone]  Followed by  . [START ON 05/12/2015] LORazepam (ATIVAN) injection 0-4 mg  0-4 mg Intravenous Q12H Harvette C Jenkins, MD      . LORazepam (ATIVAN) tablet 1 mg  1 mg Oral Q6H PRN Theressa Millard, MD       Or  . LORazepam (ATIVAN) injection 1 mg  1 mg Intravenous Q6H PRN Theressa Millard, MD      . multivitamin with minerals tablet 1 tablet  1 tablet Oral Daily Theressa Millard, MD   1 tablet at 05/11/15 0950  . ondansetron (ZOFRAN) tablet 4 mg  4 mg Oral Q6H PRN Theressa Millard, MD       Or  . ondansetron (ZOFRAN) injection 4 mg  4 mg Intravenous Q6H PRN Theressa Millard, MD      . oxyCODONE (Oxy IR/ROXICODONE) immediate release tablet 5 mg  5 mg  Oral Q4H PRN Theressa Millard, MD      . pantoprazole (PROTONIX) injection 40 mg  40 mg Intravenous Q12H Theressa Millard, MD   40 mg at 05/11/15 0950  . thiamine (VITAMIN B-1) tablet 100 mg  100 mg Oral Daily Theressa Millard, MD   100 mg at 05/11/15 [id number]   Or  . thiamine (B-1) injection 100 mg  100 mg Intravenous Daily Theressa Millard, MD        Allergies as of 05/10/2015 - Review Complete 05/10/2015  Allergen Reaction Noted  . Penicillins Rash 09/25/2010    Family History  Problem Relation Age of Onset  . Colon cancer Neg Hx     Social History   Social History  . Marital Status: Single    Spouse Name: N/A  . Number of Children: N/A  . Years of Education: N/A   Occupational History  . Not on file.   Social History Main Topics  . Smoking status: Current Every Day Smoker -- 3.00 packs/day    Types: Cigarettes  . Smokeless tobacco: Not on file     Comment: 3 packs a day normally, but states "lately I'm down to 1" as of March 2017   . Alcohol Use: 0.0 oz/week    0 Standard drinks or equivalent per week     Comment: 6-12 pack beer at least 5 days a week   . Drug Use: No  . Sexual Activity: Not on file   Other Topics Concern  . Not on file   Social History Narrative    Review of Systems: As mentioned in HPI   Physical Exam: Vital signs in last 24 hours: Temp:  [98.1 F (36.7 C)-98.5 F (36.9 C)] 98.1 F (36.7 C) (03/20 0800) Pulse Rate:  [65-111] 74 (03/20 0800) Resp:  [16-23] 20 (03/20 0800) BP: (91-123)/(60-93) 101/66 mmHg (03/20 0800) SpO2:  [96 %-100 %] 100 % (03/20 0800) Weight:  [136 lb 14.4 oz (62.097 kg)-145 lb (65.772 kg)] 136 lb 14.4 oz (62.097 kg) (03/20 0000) Last BM Date: 05/11/15 General:   Alert,  Well-developed, well-nourished, appears older than stated age Head:  Normocephalic and atraumatic. Eyes:  Sclera clear, no icterus.   Conjunctiva pink. Ears:  Normal auditory acuity. Nose:  No deformity, discharge,  or lesions. Mouth:  No  deformity or lesions, dentition normal. Lungs:  Clear throughout to auscultation.   No wheezes, crackles, or rhonchi. No acute distress. Heart:  Regular rate and rhythm; no murmurs, clicks, rubs,  or gallops. Abdomen:  Soft, TTP RUQ/epigastric and nondistended. No masses, hepatosplenomegaly or hernias noted. Normal  bowel sounds, without guarding, and without rebound.   Rectal:  Deferred until time of colonoscopy.   Msk:  Symmetrical without gross deformities. Normal posture. Extremities:  Without  edema. Neurologic:  Alert and  oriented x4;  grossly normal neurologically. Psych:  Alert and cooperative. Normal mood and affect.  Intake/Output from previous day: 03/19 0701 - 03/20 0700 In: -  Out: 800 [Urine:800] Intake/Output this shift:    Lab Results:  Recent Labs  05/10/15 1829 05/10/15 2249 05/11/15 0543  WBC 3.9*  --  3.3*  HGB 14.6 12.7* 12.8*  HCT 42.1 36.2* 37.1*  PLT 172  --  170   BMET  Recent Labs  05/10/15 1829 05/11/15 0543  NA 126* 130*  K 4.7 4.3  CL 96* 105  CO2 22 19*  GLUCOSE 129* 84  BUN 24* 18  CREATININE 1.45* 0.90  CALCIUM 8.5* 7.7*   LFT  Recent Labs  05/10/15 1829  PROT 7.2  ALBUMIN 3.9  AST 43*  ALT 33  ALKPHOS 61  BILITOT 0.4   PT/INR  Recent Labs  05/10/15 1918  LABPROT 12.4  INR 0.90    Studies/Results: Ct Abdomen Pelvis Wo Contrast  05/10/2015  CLINICAL DATA:  Right upper quadrant pain for 4 days with black diarrhea with nausea and vomiting EXAM: CT ABDOMEN AND PELVIS WITHOUT CONTRAST TECHNIQUE: Multidetector CT imaging of the abdomen and pelvis was performed following the standard protocol without IV contrast. COMPARISON:  10/09/2001 FINDINGS: Lower chest:  Negative Hepatobiliary: Negative Pancreas: Negative Spleen: Negative Adrenals/Urinary Tract: 2 mm nonobstructing stone lower pole right kidney. Adrenal glands and kidneys otherwise negative. Bladder negative. Stomach/Bowel: Appendix is normal. There is a nonobstructive  bowel gas pattern. There are air-fluid levels throughout the large bowel into the rectum consistent with diarrhea type illness. The large bowel is otherwise negative. Small bowel is normal. Stomach is normal. There is fluid in the distal esophagus consistent with reflux. Vascular/Lymphatic: No significant adenopathy in the abdomen or pelvis. Mild aortic calcification. Reproductive: Negative Other: No ascites Musculoskeletal: No acute osseous abnormalities IMPRESSION: Findings consistent with diarrhea type illness with no specific abnormalities. Electronically Signed   By: Skipper Cliche M.D.   On: 05/10/2015 20:45   Dg Chest 2 View  05/10/2015  CLINICAL DATA:  Syncope. Abrasion along the right forehead. Right upper quadrant abdominal pain. EXAM: CHEST  2 VIEW COMPARISON:  03/03/2014 FINDINGS: Tapering of the peripheral pulmonary vasculature favors emphysema. Cardiac and mediastinal margins appear normal. The lungs appear otherwise clear. Right C7 cervical rib and partial left C7 cervical rib. IMPRESSION: 1. Emphysema. 2. Bilateral cervical ribs incidentally noted. Electronically Signed   By: Van Clines M.D.   On: 05/10/2015 20:25   Ct Head Wo Contrast  05/10/2015  CLINICAL DATA:  Syncope and fall. Right forehead abrasion. Initial encounter. EXAM: CT HEAD WITHOUT CONTRAST CT CERVICAL SPINE WITHOUT CONTRAST TECHNIQUE: Multidetector CT imaging of the head and cervical spine was performed following the standard protocol without intravenous contrast. Multiplanar CT image reconstructions of the cervical spine were also generated. COMPARISON:  Cervical spine MRI 05/29/2013 FINDINGS: CT HEAD FINDINGS Skull and Sinuses:Negative for fracture. Diffuse paranasal sinus mucosal thickening with low-density fluid levels in the left sphenoid and maxillary sinuses. Visualized orbits: Negative. Brain: Unremarkable. No evidence of acute infarction, hemorrhage, hydrocephalus, or mass lesion/mass effect. CT CERVICAL SPINE  FINDINGS Negative for acute fracture or subluxation. No prevertebral edema. No gross cervical canal hematoma. Spondylosis and facet arthropathy. Multilevel foraminal stenosis, left more than right. Foraminal  narrowing is particularly severe bilaterally at C2-3. 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 C1-2 alignment is stable from comparison MRI. Apical emphysema. IMPRESSION: 1. No evidence of acute intracranial or cervical spine injury. 2. Sinusitis with left sphenoid and maxillary fluid levels. 3. 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 Electronically Signed   By: Monte Fantasia M.D.   On: 05/10/2015 20:44   Ct Cervical Spine Wo Contrast  05/10/2015  CLINICAL DATA:  Syncope and fall. Right forehead abrasion. Initial encounter. EXAM: CT HEAD WITHOUT CONTRAST CT CERVICAL SPINE WITHOUT CONTRAST TECHNIQUE: Multidetector CT imaging of the head and cervical spine was performed following the standard protocol without intravenous contrast. Multiplanar CT image reconstructions of the cervical spine were also generated. COMPARISON:  Cervical spine MRI 05/29/2013 FINDINGS: CT HEAD FINDINGS Skull and Sinuses:Negative for fracture. Diffuse paranasal sinus mucosal thickening with low-density fluid levels in the left sphenoid and maxillary sinuses. Visualized orbits: Negative. Brain: Unremarkable. No evidence of acute infarction, hemorrhage, hydrocephalus, or mass lesion/mass effect. CT CERVICAL SPINE FINDINGS Negative for acute fracture or subluxation. No prevertebral edema. No gross cervical canal hematoma. Spondylosis and facet arthropathy. Multilevel foraminal stenosis, left more than right. Foraminal narrowing is particularly severe bilaterally at C2-3. 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 C1-2 alignment is stable from comparison MRI. Apical emphysema. IMPRESSION: 1.  No evidence of acute intracranial or cervical spine injury. 2. Sinusitis with left sphenoid and maxillary fluid levels. 3. 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 Electronically Signed   By: Monte Fantasia M.D.   On: 05/10/2015 20:44    Impression: 50 year old male admitted after syncopal episode, with reports of diarrhea, black stool, heme positive, RUQ pain. Loose stool seems to be tapering down. Hgb down 2 grams from admission likely multifactorial in setting of fluid resuscitation, possible upper GI bleed source. In setting of chronic ETOH use and NSAIDs, concern raised for gastritis/PUD. Minimally elevated lipase not indicative of pancreatitis (61 and 54). CT on file (but without contrast) does not note any acute findings. Recommend EGD for further evaluation and would likely need Propofol due to chronic ETOH use. If any further diarrhea, obtain stool studies. NPO for now until decision made on timing for endoscopic evaluation.   Plan: Continue PPI BID Stool studies if any further diarrhea Monitor H/H Tentative EGD with Propofol in near future: to discuss with Dr. Oneida Alar NPO for now Recommend ETOH cessation   Orvil Feil, ANP-BC [facility]     LOS: 1 day    05/11/2015, 10:07 AM

## 2015-05-11 NOTE — Progress Notes (Signed)
Subjective: He says he feels better. He still has some abdominal pain. His stool is still dark. No further episodes of syncope. His hemoglobin level has been stable  Objective: Vital signs in last 24 hours: Temp:  [98.2 F (36.8 C)-98.5 F (36.9 C)] 98.3 F (36.8 C) (03/20 0400) Pulse Rate:  [65-111] 111 (03/20 0400) Resp:  [16-23] 20 (03/20 0400) BP: (91-123)/(60-93) 108/60 mmHg (03/20 0400) SpO2:  [96 %-100 %] 96 % (03/20 0400) Weight:  [62.097 kg (136 lb 14.4 oz)-65.772 kg (145 lb)] 62.097 kg (136 lb 14.4 oz) (03/20 0000) Weight change:  Last BM Date: 05/11/15  Intake/Output from previous day: 03/19 0701 - 03/20 0700 In: -  Out: 800 [Urine:800]  PHYSICAL EXAM General appearance: alert, cooperative and no distress Resp: clear to auscultation bilaterally Cardio: regular rate and rhythm, S1, S2 normal, no murmur, click, rub or gallop GI: soft, non-tender; bowel sounds normal; no masses,  no organomegaly Extremities: extremities normal, atraumatic, no cyanosis or edema  Lab Results:  Results for orders placed or performed during the hospital encounter of 05/10/15 (from the past 48 hour(s))  Lipase, blood     Status: Abnormal   Collection Time: 05/10/15  6:29 PM  Result Value Ref Range   Lipase 61 (H) 11 - 51 U/L  Comprehensive metabolic panel     Status: Abnormal   Collection Time: 05/10/15  6:29 PM  Result Value Ref Range   Sodium 126 (L) 135 - 145 mmol/L   Potassium 4.7 3.5 - 5.1 mmol/L   Chloride 96 (L) 101 - 111 mmol/L   CO2 22 22 - 32 mmol/L   Glucose, Bld 129 (H) 65 - 99 mg/dL   BUN 24 (H) 6 - 20 mg/dL   Creatinine, Ser 1.45 (H) 0.61 - 1.24 mg/dL   Calcium 8.5 (L) 8.9 - 10.3 mg/dL   Total Protein 7.2 6.5 - 8.1 g/dL   Albumin 3.9 3.5 - 5.0 g/dL   AST 43 (H) 15 - 41 U/L   ALT 33 17 - 63 U/L   Alkaline Phosphatase 61 38 - 126 U/L   Total Bilirubin 0.4 0.3 - 1.2 mg/dL   GFR calc non Af Amer 56 (L) >60 mL/min   GFR calc Af Amer >60 >60 mL/min    Comment:  (NOTE) The eGFR has been calculated using the CKD EPI equation. This calculation has not been validated in all clinical situations. eGFR's persistently <60 mL/min signify possible Chronic Kidney Disease.    Anion gap 8 5 - 15  CBC     Status: Abnormal   Collection Time: 05/10/15  6:29 PM  Result Value Ref Range   WBC 3.9 (L) 4.0 - 10.5 K/uL   RBC 4.76 4.22 - 5.81 MIL/uL   Hemoglobin 14.6 13.0 - 17.0 g/dL   HCT 42.1 39.0 - 52.0 %   MCV 88.4 78.0 - 100.0 fL   MCH 30.7 26.0 - 34.0 pg   MCHC 34.7 30.0 - 36.0 g/dL   RDW 12.1 11.5 - 15.5 %   Platelets 172 150 - 400 K/uL  POC occult blood, ED     Status: Abnormal   Collection Time: 05/10/15  6:47 PM  Result Value Ref Range   Fecal Occult Bld POSITIVE (A) NEGATIVE  I-Stat CG4 Lactic Acid, ED     Status: None   Collection Time: 05/10/15  7:08 PM  Result Value Ref Range   Lactic Acid, Venous 1.44 0.5 - 2.0 mmol/L  Ethanol     Status:  None   Collection Time: 05/10/15  7:18 PM  Result Value Ref Range   Alcohol, Ethyl (B) <5 <5 mg/dL    Comment:        LOWEST DETECTABLE LIMIT FOR SERUM ALCOHOL IS 5 mg/dL FOR MEDICAL PURPOSES ONLY   Troponin I     Status: None   Collection Time: 05/10/15  7:18 PM  Result Value Ref Range   Troponin I <0.03 <0.031 ng/mL    Comment:        NO INDICATION OF MYOCARDIAL INJURY.   Protime-INR     Status: None   Collection Time: 05/10/15  7:18 PM  Result Value Ref Range   Prothrombin Time 12.4 11.6 - 15.2 seconds   INR 0.90 0.00 - 1.49  Lactic acid, plasma     Status: None   Collection Time: 05/10/15  7:18 PM  Result Value Ref Range   Lactic Acid, Venous 1.0 0.5 - 2.0 mmol/L  Urinalysis, Routine w reflex microscopic (not at [facility])     Status: None   Collection Time: 05/10/15  8:48 PM  Result Value Ref Range   Color, Urine YELLOW YELLOW   APPearance CLEAR CLEAR   Specific Gravity, Urine 1.015 1.005 - 1.030   pH 5.0 5.0 - 8.0   Glucose, UA NEGATIVE NEGATIVE mg/dL   Hgb urine dipstick NEGATIVE  NEGATIVE   Bilirubin Urine NEGATIVE NEGATIVE   Ketones, ur NEGATIVE NEGATIVE mg/dL   Protein, ur NEGATIVE NEGATIVE mg/dL   Nitrite NEGATIVE NEGATIVE   Leukocytes, UA NEGATIVE NEGATIVE    Comment: MICROSCOPIC NOT DONE ON URINES WITH NEGATIVE PROTEIN, BLOOD, LEUKOCYTES, NITRITE, OR GLUCOSE <1000 mg/dL.  Urine rapid drug screen (hosp performed)     Status: Abnormal   Collection Time: 05/10/15  8:49 PM  Result Value Ref Range   Opiates POSITIVE (A) NONE DETECTED   Cocaine NONE DETECTED NONE DETECTED   Benzodiazepines NONE DETECTED NONE DETECTED   Amphetamines NONE DETECTED NONE DETECTED   Tetrahydrocannabinol NONE DETECTED NONE DETECTED   Barbiturates NONE DETECTED NONE DETECTED    Comment:        DRUG SCREEN FOR MEDICAL PURPOSES ONLY.  IF CONFIRMATION IS NEEDED FOR ANY PURPOSE, NOTIFY LAB WITHIN 5 DAYS.        LOWEST DETECTABLE LIMITS FOR URINE DRUG SCREEN Drug Class       Cutoff (ng/mL) Amphetamine      1000 Barbiturate      200 Benzodiazepine   701 Tricyclics       779 Opiates          300 Cocaine          300 THC              50   Lactic acid, plasma     Status: None   Collection Time: 05/10/15  9:52 PM  Result Value Ref Range   Lactic Acid, Venous 1.0 0.5 - 2.0 mmol/L  Hemoglobin and hematocrit, blood     Status: Abnormal   Collection Time: 05/10/15 10:49 PM  Result Value Ref Range   Hemoglobin 12.7 (L) 13.0 - 17.0 g/dL   HCT 36.2 (L) 39.0 - 52.0 %  Type and screen [facility]     Status: None   Collection Time: 05/10/15 10:49 PM  Result Value Ref Range   ABO/RH(D) O NEG    Antibody Screen NEG    Sample Expiration 39/04/90   Basic metabolic panel     Status: Abnormal   Collection Time:  05/11/15  5:43 AM  Result Value Ref Range   Sodium 130 (L) 135 - 145 mmol/L   Potassium 4.3 3.5 - 5.1 mmol/L   Chloride 105 101 - 111 mmol/L   CO2 19 (L) 22 - 32 mmol/L   Glucose, Bld 84 65 - 99 mg/dL   BUN 18 6 - 20 mg/dL   Creatinine, Ser 0.90 0.61 - 1.24 mg/dL    Calcium 7.7 (L) 8.9 - 10.3 mg/dL   GFR calc non Af Amer >60 >60 mL/min   GFR calc Af Amer >60 >60 mL/min    Comment: (NOTE) The eGFR has been calculated using the CKD EPI equation. This calculation has not been validated in all clinical situations. eGFR's persistently <60 mL/min signify possible Chronic Kidney Disease.    Anion gap 6 5 - 15  CBC     Status: Abnormal   Collection Time: 05/11/15  5:43 AM  Result Value Ref Range   WBC 3.3 (L) 4.0 - 10.5 K/uL   RBC 4.19 (L) 4.22 - 5.81 MIL/uL   Hemoglobin 12.8 (L) 13.0 - 17.0 g/dL   HCT 37.1 (L) 39.0 - 52.0 %   MCV 88.5 78.0 - 100.0 fL   MCH 30.5 26.0 - 34.0 pg   MCHC 34.5 30.0 - 36.0 g/dL   RDW 12.2 11.5 - 15.5 %   Platelets 170 150 - 400 K/uL  Lipase, blood     Status: Abnormal   Collection Time: 05/11/15  5:43 AM  Result Value Ref Range   Lipase 54 (H) 11 - 51 U/L  Na and K (sodium & potassium), rand urine     Status: None   Collection Time: 05/11/15  7:27 AM  Result Value Ref Range   Sodium, Ur 44 mmol/L   Potassium Urine Timed 15 mmol/L    ABGS No results for input(s): PHART, PO2ART, TCO2, HCO3 in the last 72 hours.  Invalid input(s): PCO2 CULTURES No results found for this or any previous visit (from the past 240 hour(s)). Studies/Results: Ct Abdomen Pelvis Wo Contrast  05/10/2015  CLINICAL DATA:  Right upper quadrant pain for 4 days with black diarrhea with nausea and vomiting EXAM: CT ABDOMEN AND PELVIS WITHOUT CONTRAST TECHNIQUE: Multidetector CT imaging of the abdomen and pelvis was performed following the standard protocol without IV contrast. COMPARISON:  10/09/2001 FINDINGS: Lower chest:  Negative Hepatobiliary: Negative Pancreas: Negative Spleen: Negative Adrenals/Urinary Tract: 2 mm nonobstructing stone lower pole right kidney. Adrenal glands and kidneys otherwise negative. Bladder negative. Stomach/Bowel: Appendix is normal. There is a nonobstructive bowel gas pattern. There are air-fluid levels throughout the  large bowel into the rectum consistent with diarrhea type illness. The large bowel is otherwise negative. Small bowel is normal. Stomach is normal. There is fluid in the distal esophagus consistent with reflux. Vascular/Lymphatic: No significant adenopathy in the abdomen or pelvis. Mild aortic calcification. Reproductive: Negative Other: No ascites Musculoskeletal: No acute osseous abnormalities IMPRESSION: Findings consistent with diarrhea type illness with no specific abnormalities. Electronically Signed   By: Skipper Cliche M.D.   On: 05/10/2015 20:45   Dg Chest 2 View  05/10/2015  CLINICAL DATA:  Syncope. Abrasion along the right forehead. Right upper quadrant abdominal pain. EXAM: CHEST  2 VIEW COMPARISON:  03/03/2014 FINDINGS: Tapering of the peripheral pulmonary vasculature favors emphysema. Cardiac and mediastinal margins appear normal. The lungs appear otherwise clear. Right C7 cervical rib and partial left C7 cervical rib. IMPRESSION: 1. Emphysema. 2. Bilateral cervical ribs incidentally noted. Electronically Signed  By: Van Clines M.D.   On: 05/10/2015 20:25   Ct Head Wo Contrast  05/10/2015  CLINICAL DATA:  Syncope and fall. Right forehead abrasion. Initial encounter. EXAM: CT HEAD WITHOUT CONTRAST CT CERVICAL SPINE WITHOUT CONTRAST TECHNIQUE: Multidetector CT imaging of the head and cervical spine was performed following the standard protocol without intravenous contrast. Multiplanar CT image reconstructions of the cervical spine were also generated. COMPARISON:  Cervical spine MRI 05/29/2013 FINDINGS: CT HEAD FINDINGS Skull and Sinuses:Negative for fracture. Diffuse paranasal sinus mucosal thickening with low-density fluid levels in the left sphenoid and maxillary sinuses. Visualized orbits: Negative. Brain: Unremarkable. No evidence of acute infarction, hemorrhage, hydrocephalus, or mass lesion/mass effect. CT CERVICAL SPINE FINDINGS Negative for acute fracture or subluxation. No  prevertebral edema. No gross cervical canal hematoma. Spondylosis and facet arthropathy. Multilevel foraminal stenosis, left more than right. Foraminal narrowing is particularly severe bilaterally at C2-3. 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 C1-2 alignment is stable from comparison MRI. Apical emphysema. IMPRESSION: 1. No evidence of acute intracranial or cervical spine injury. 2. Sinusitis with left sphenoid and maxillary fluid levels. 3. 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 Electronically Signed   By: Monte Fantasia M.D.   On: 05/10/2015 20:44   Ct Cervical Spine Wo Contrast  05/10/2015  CLINICAL DATA:  Syncope and fall. Right forehead abrasion. Initial encounter. EXAM: CT HEAD WITHOUT CONTRAST CT CERVICAL SPINE WITHOUT CONTRAST TECHNIQUE: Multidetector CT imaging of the head and cervical spine was performed following the standard protocol without intravenous contrast. Multiplanar CT image reconstructions of the cervical spine were also generated. COMPARISON:  Cervical spine MRI 05/29/2013 FINDINGS: CT HEAD FINDINGS Skull and Sinuses:Negative for fracture. Diffuse paranasal sinus mucosal thickening with low-density fluid levels in the left sphenoid and maxillary sinuses. Visualized orbits: Negative. Brain: Unremarkable. No evidence of acute infarction, hemorrhage, hydrocephalus, or mass lesion/mass effect. CT CERVICAL SPINE FINDINGS Negative for acute fracture or subluxation. No prevertebral edema. No gross cervical canal hematoma. Spondylosis and facet arthropathy. Multilevel foraminal stenosis, left more than right. Foraminal narrowing is particularly severe bilaterally at C2-3. 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 C1-2 alignment is stable from comparison MRI. Apical emphysema. IMPRESSION: 1. No evidence of acute intracranial or cervical spine  injury. 2. Sinusitis with left sphenoid and maxillary fluid levels. 3. 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 Electronically Signed   By: Monte Fantasia M.D.   On: 05/10/2015 20:44    Medications:  Prior to Admission:  Prescriptions prior to admission  Medication Sig Dispense Refill Last Dose  . folic acid (FOLVITE) 1 MG tablet Take 1 mg by mouth daily.   05/10/2015 at Unknown time  . lisinopril (PRINIVIL,ZESTRIL) 10 MG tablet Take 10 mg by mouth daily.   05/10/2015 at Unknown time  . methotrexate (RHEUMATREX) 2.5 MG tablet Take 5 mg by mouth once a week. saturday   05/02/2015   Scheduled: . folic acid  1 mg Oral Daily  . LORazepam  0-4 mg Intravenous Q6H   Followed by  . [START ON 05/12/2015] LORazepam  0-4 mg Intravenous Q12H  . multivitamin with minerals  1 tablet Oral Daily  . pantoprazole (PROTONIX) IV  40 mg Intravenous Q12H  . thiamine  100 mg Oral Daily   Or  . thiamine  100 mg Intravenous Daily   Continuous: . sodium chloride 100 mL/hr at 05/11/15 0038   ZOX:[id number] **OR** acetaminophen,  alum & mag hydroxide-simeth, HYDROmorphone (DILAUDID) injection, LORazepam **OR** LORazepam, ondansetron **OR** ondansetron (ZOFRAN) IV, oxyCODONE  Assesment: He has had syncope likely multifactorial. He may have been having some alcohol withdrawal. He had GI bleeding. He has what appears to be pancreatitis based on elevated lipase. He was hyponatremic which is better. He had acute kidney injury I think from dehydration. He has alcohol abuse and says that he drinks between 6 and 12 beers a day.  He has COPD and continued tobacco use and that is stable.  He has severe psoriasis on a biologic agent Principal Problem:   GI bleed Active Problems:   Syncope and collapse   RUQ abdominal pain   Alcohol abuse   COPD (chronic obstructive pulmonary disease) (HCC)   Acute pancreatitis   AKI (acute kidney injury) ([place])   Hyponatremia    Plan: Continue  current treatments. Recheck lipase today. Request GI consultation regarding the GI bleeding although it does not seem to have been a large GI bleed    LOS: 1 day   Markevius Trombetta L 05/11/2015, 8:35 AM

## 2015-05-12 ENCOUNTER — Inpatient Hospital Stay (HOSPITAL_COMMUNITY): Payer: Medicaid Other | Admitting: Anesthesiology

## 2015-05-12 ENCOUNTER — Encounter (HOSPITAL_COMMUNITY): Payer: Self-pay | Admitting: *Deleted

## 2015-05-12 ENCOUNTER — Encounter (HOSPITAL_COMMUNITY)
Admission: EM | Disposition: A | Discharge status: Home / Self Care | Payer: Self-pay | Source: Home / Self Care | Attending: Pulmonary Disease

## 2015-05-12 HISTORY — PX: BIOPSY: SHX5522

## 2015-05-12 HISTORY — PX: ESOPHAGOGASTRODUODENOSCOPY (EGD) WITH PROPOFOL: SHX5813

## 2015-05-12 LAB — CBC
HCT: 37.4 % — ABNORMAL LOW (ref 39.0–52.0)
Hemoglobin: 12.9 g/dL — ABNORMAL LOW (ref 13.0–17.0)
MCH: 30.9 pg (ref 26.0–34.0)
MCHC: 34.5 g/dL (ref 30.0–36.0)
MCV: 89.7 fL (ref 78.0–100.0)
Platelets: 170 10*3/uL (ref 150–400)
RBC: 4.17 MIL/uL — ABNORMAL LOW (ref 4.22–5.81)
RDW: 12.4 % (ref 11.5–15.5)
WBC: 4 10*3/uL (ref 4.0–10.5)

## 2015-05-12 SURGERY — ESOPHAGOGASTRODUODENOSCOPY (EGD) WITH PROPOFOL
Anesthesia: Monitor Anesthesia Care

## 2015-05-12 MED ORDER — MIDAZOLAM HCL 5 MG/ML IJ SOLN
2.0000 mg | Freq: Once | INTRAMUSCULAR | Status: AC
  Administered 2015-05-12: 2 mg via INTRAVENOUS

## 2015-05-12 MED ORDER — IPRATROPIUM-ALBUTEROL 0.5-2.5 (3) MG/3ML IN SOLN
3.0000 mL | Freq: Once | RESPIRATORY_TRACT | Status: DC
  Administered 2015-05-12: 3 mL via RESPIRATORY_TRACT

## 2015-05-12 MED ORDER — PANTOPRAZOLE SODIUM 40 MG PO TBEC: 40.0000 mg | DELAYED_RELEASE_TABLET | Freq: Two times a day (BID) | ORAL | Status: DC

## 2015-05-12 MED ORDER — PROPOFOL 500 MG/50ML IV EMUL
INTRAVENOUS | Status: DC | PRN
  Administered 2015-05-12: 175 ug/kg/min via INTRAVENOUS
  Administered 2015-05-12: 50 ug/kg/min via INTRAVENOUS

## 2015-05-12 MED ORDER — LIDOCAINE VISCOUS 2 % MT SOLN
15.0000 mL | Freq: Once | OROMUCOSAL | Status: AC
  Administered 2015-05-12: 15 mL via OROMUCOSAL

## 2015-05-12 MED ORDER — PROPOFOL 10 MG/ML IV BOLUS
INTRAVENOUS | Status: DC | PRN
  Administered 2015-05-12 (×3): 6.2 mg via INTRAVENOUS

## 2015-05-12 MED ORDER — FENTANYL CITRATE (PF) 100 MCG/2ML IJ SOLN: 25.0000 ug | INTRAMUSCULAR | Status: DC | PRN

## 2015-05-12 MED ORDER — MIDAZOLAM HCL 2 MG/2ML IJ SOLN
INTRAMUSCULAR | Status: AC
  Filled 2015-05-12: qty 2

## 2015-05-12 MED ORDER — PROPOFOL 10 MG/ML IV BOLUS: INTRAVENOUS | Status: DC | PRN

## 2015-05-12 MED ORDER — SODIUM CHLORIDE 0.9 % IV SOLN
INTRAVENOUS | Status: DC
Start: 2015-05-12 — End: 2015-05-12

## 2015-05-12 MED ORDER — IPRATROPIUM-ALBUTEROL 0.5-2.5 (3) MG/3ML IN SOLN
RESPIRATORY_TRACT | Status: AC
  Filled 2015-05-12: qty 3

## 2015-05-12 MED ORDER — LACTATED RINGERS IV SOLN
INTRAVENOUS | Status: DC
  Administered 2015-05-12: 12:00:00 via INTRAVENOUS

## 2015-05-12 MED ORDER — LACTATED RINGERS IV SOLN
INTRAVENOUS | Status: DC | PRN
  Administered 2015-05-12: 12:00:00 via INTRAVENOUS

## 2015-05-12 MED ORDER — LIDOCAINE VISCOUS 2 % MT SOLN
OROMUCOSAL | Status: AC
  Filled 2015-05-12: qty 15

## 2015-05-12 MED ORDER — LIDOCAINE HCL (CARDIAC) 10 MG/ML IV SOLN
INTRAVENOUS | Status: DC | PRN
  Administered 2015-05-12: 50 mg via INTRAVENOUS

## 2015-05-12 MED ORDER — MIDAZOLAM HCL 2 MG/2ML IJ SOLN
1.0000 mg | INTRAMUSCULAR | Status: DC | PRN
  Administered 2015-05-12: 2 mg via INTRAVENOUS

## 2015-05-12 MED ORDER — PROPOFOL 10 MG/ML IV BOLUS
INTRAVENOUS | Status: AC
  Filled 2015-05-12: qty 20

## 2015-05-12 MED ORDER — ONDANSETRON HCL 4 MG/2ML IJ SOLN: 4.0000 mg | Freq: Once | INTRAMUSCULAR | Status: DC | PRN

## 2015-05-12 NOTE — Progress Notes (Signed)
Subjective: He says he feels better. He has not noticed any more bleeding. He is scheduled for EGD today  Objective: Vital signs in last 24 hours: Temp:  [97.6 F (36.4 C)-98.9 F (37.2 C)] 98 F (36.7 C) (03/21 0500) Pulse Rate:  [70-87] 87 (03/21 0500) Resp:  [20-24] 20 (03/21 0500) BP: (90-112)/(60-72) 90/60 mmHg (03/21 0500) SpO2:  [94 %-100 %] 97 % (03/21 0500) Weight change:  Last BM Date: 05/10/15  Intake/Output from previous day: 03/20 0701 - 03/21 0700 In: 3183.3 [P.O.:240; I.V.:2943.3] Out: -   PHYSICAL EXAM General appearance: alert, cooperative and no distress Resp: rhonchi bilaterally Cardio: regular rate and rhythm, S1, S2 normal, no murmur, click, rub or gallop GI: soft, non-tender; bowel sounds normal; no masses,  no organomegaly Extremities: extremities normal, atraumatic, no cyanosis or edema  Lab Results:  Results for orders placed or performed during the hospital encounter of 05/10/15 (from the past 48 hour(s))  Lipase, blood     Status: Abnormal   Collection Time: 05/10/15  6:29 PM  Result Value Ref Range   Lipase 61 (H) 11 - 51 U/L  Comprehensive metabolic panel     Status: Abnormal   Collection Time: 05/10/15  6:29 PM  Result Value Ref Range   Sodium 126 (L) 135 - 145 mmol/L   Potassium 4.7 3.5 - 5.1 mmol/L   Chloride 96 (L) 101 - 111 mmol/L   CO2 22 22 - 32 mmol/L   Glucose, Bld 129 (H) 65 - 99 mg/dL   BUN 24 (H) 6 - 20 mg/dL   Creatinine, Ser 1.45 (H) 0.61 - 1.24 mg/dL   Calcium 8.5 (L) 8.9 - 10.3 mg/dL   Total Protein 7.2 6.5 - 8.1 g/dL   Albumin 3.9 3.5 - 5.0 g/dL   AST 43 (H) 15 - 41 U/L   ALT 33 17 - 63 U/L   Alkaline Phosphatase 61 38 - 126 U/L   Total Bilirubin 0.4 0.3 - 1.2 mg/dL   GFR calc non Af Amer 56 (L) >60 mL/min   GFR calc Af Amer >60 >60 mL/min    Comment: (NOTE) The eGFR has been calculated using the CKD EPI equation. This calculation has not been validated in all clinical situations. eGFR's persistently <60 mL/min  signify possible Chronic Kidney Disease.    Anion gap 8 5 - 15  CBC     Status: Abnormal   Collection Time: 05/10/15  6:29 PM  Result Value Ref Range   WBC 3.9 (L) 4.0 - 10.5 K/uL   RBC 4.76 4.22 - 5.81 MIL/uL   Hemoglobin 14.6 13.0 - 17.0 g/dL   HCT 42.1 39.0 - 52.0 %   MCV 88.4 78.0 - 100.0 fL   MCH 30.7 26.0 - 34.0 pg   MCHC 34.7 30.0 - 36.0 g/dL   RDW 12.1 11.5 - 15.5 %   Platelets 172 150 - 400 K/uL  POC occult blood, ED     Status: Abnormal   Collection Time: 05/10/15  6:47 PM  Result Value Ref Range   Fecal Occult Bld POSITIVE (A) NEGATIVE  I-Stat CG4 Lactic Acid, ED     Status: None   Collection Time: 05/10/15  7:08 PM  Result Value Ref Range   Lactic Acid, Venous 1.44 0.5 - 2.0 mmol/L  Ethanol     Status: None   Collection Time: 05/10/15  7:18 PM  Result Value Ref Range   Alcohol, Ethyl (B) <5 <5 mg/dL    Comment:  LOWEST DETECTABLE LIMIT FOR SERUM ALCOHOL IS 5 mg/dL FOR MEDICAL PURPOSES ONLY   Troponin I     Status: None   Collection Time: 05/10/15  7:18 PM  Result Value Ref Range   Troponin I <0.03 <0.031 ng/mL    Comment:        NO INDICATION OF MYOCARDIAL INJURY.   Protime-INR     Status: None   Collection Time: 05/10/15  7:18 PM  Result Value Ref Range   Prothrombin Time 12.4 11.6 - 15.2 seconds   INR 0.90 0.00 - 1.49  Lactic acid, plasma     Status: None   Collection Time: 05/10/15  7:18 PM  Result Value Ref Range   Lactic Acid, Venous 1.0 0.5 - 2.0 mmol/L  Urinalysis, Routine w reflex microscopic (not at [facility])     Status: None   Collection Time: 05/10/15  8:48 PM  Result Value Ref Range   Color, Urine YELLOW YELLOW   APPearance CLEAR CLEAR   Specific Gravity, Urine 1.015 1.005 - 1.030   pH 5.0 5.0 - 8.0   Glucose, UA NEGATIVE NEGATIVE mg/dL   Hgb urine dipstick NEGATIVE NEGATIVE   Bilirubin Urine NEGATIVE NEGATIVE   Ketones, ur NEGATIVE NEGATIVE mg/dL   Protein, ur NEGATIVE NEGATIVE mg/dL   Nitrite NEGATIVE NEGATIVE   Leukocytes, UA  NEGATIVE NEGATIVE    Comment: MICROSCOPIC NOT DONE ON URINES WITH NEGATIVE PROTEIN, BLOOD, LEUKOCYTES, NITRITE, OR GLUCOSE <1000 mg/dL.  Urine rapid drug screen (hosp performed)     Status: Abnormal   Collection Time: 05/10/15  8:49 PM  Result Value Ref Range   Opiates POSITIVE (A) NONE DETECTED   Cocaine NONE DETECTED NONE DETECTED   Benzodiazepines NONE DETECTED NONE DETECTED   Amphetamines NONE DETECTED NONE DETECTED   Tetrahydrocannabinol NONE DETECTED NONE DETECTED   Barbiturates NONE DETECTED NONE DETECTED    Comment:        DRUG SCREEN FOR MEDICAL PURPOSES ONLY.  IF CONFIRMATION IS NEEDED FOR ANY PURPOSE, NOTIFY LAB WITHIN 5 DAYS.        LOWEST DETECTABLE LIMITS FOR URINE DRUG SCREEN Drug Class       Cutoff (ng/mL) Amphetamine      1000 Barbiturate      200 Benzodiazepine   563 Tricyclics       875 Opiates          300 Cocaine          300 THC              50   Lactic acid, plasma     Status: None   Collection Time: 05/10/15  9:52 PM  Result Value Ref Range   Lactic Acid, Venous 1.0 0.5 - 2.0 mmol/L  Hemoglobin and hematocrit, blood     Status: Abnormal   Collection Time: 05/10/15 10:49 PM  Result Value Ref Range   Hemoglobin 12.7 (L) 13.0 - 17.0 g/dL   HCT 36.2 (L) 39.0 - 52.0 %  Type and screen [facility]     Status: None   Collection Time: 05/10/15 10:49 PM  Result Value Ref Range   ABO/RH(D) O NEG    Antibody Screen NEG    Sample Expiration 64/33/2951   Basic metabolic panel     Status: Abnormal   Collection Time: 05/11/15  5:43 AM  Result Value Ref Range   Sodium 130 (L) 135 - 145 mmol/L   Potassium 4.3 3.5 - 5.1 mmol/L   Chloride 105 101 - 111  mmol/L   CO2 19 (L) 22 - 32 mmol/L   Glucose, Bld 84 65 - 99 mg/dL   BUN 18 6 - 20 mg/dL   Creatinine, Ser 0.90 0.61 - 1.24 mg/dL   Calcium 7.7 (L) 8.9 - 10.3 mg/dL   GFR calc non Af Amer >60 >60 mL/min   GFR calc Af Amer >60 >60 mL/min    Comment: (NOTE) The eGFR has been calculated using the CKD  EPI equation. This calculation has not been validated in all clinical situations. eGFR's persistently <60 mL/min signify possible Chronic Kidney Disease.    Anion gap 6 5 - 15  CBC     Status: Abnormal   Collection Time: 05/11/15  5:43 AM  Result Value Ref Range   WBC 3.3 (L) 4.0 - 10.5 K/uL   RBC 4.19 (L) 4.22 - 5.81 MIL/uL   Hemoglobin 12.8 (L) 13.0 - 17.0 g/dL   HCT 37.1 (L) 39.0 - 52.0 %   MCV 88.5 78.0 - 100.0 fL   MCH 30.5 26.0 - 34.0 pg   MCHC 34.5 30.0 - 36.0 g/dL   RDW 12.2 11.5 - 15.5 %   Platelets 170 150 - 400 K/uL  Lipase, blood     Status: Abnormal   Collection Time: 05/11/15  5:43 AM  Result Value Ref Range   Lipase 54 (H) 11 - 51 U/L  Na and K (sodium & potassium), rand urine     Status: None   Collection Time: 05/11/15  7:27 AM  Result Value Ref Range   Sodium, Ur 44 mmol/L   Potassium Urine Timed 15 mmol/L  Osmolality, urine     Status: Abnormal   Collection Time: 05/11/15  7:27 AM  Result Value Ref Range   Osmolality, Ur 263 (L) 300 - 900 mOsm/kg    Comment: REPEATED TO VERIFY Performed at [facility]   Hemoglobin and hematocrit, blood     Status: Abnormal   Collection Time: 05/11/15  2:38 PM  Result Value Ref Range   Hemoglobin 12.6 (L) 13.0 - 17.0 g/dL   HCT 36.2 (L) 39.0 - 52.0 %  CBC     Status: Abnormal   Collection Time: 05/12/15  4:56 AM  Result Value Ref Range   WBC 4.0 4.0 - 10.5 K/uL   RBC 4.17 (L) 4.22 - 5.81 MIL/uL   Hemoglobin 12.9 (L) 13.0 - 17.0 g/dL   HCT 37.4 (L) 39.0 - 52.0 %   MCV 89.7 78.0 - 100.0 fL   MCH 30.9 26.0 - 34.0 pg   MCHC 34.5 30.0 - 36.0 g/dL   RDW 12.4 11.5 - 15.5 %   Platelets 170 150 - 400 K/uL    ABGS No results for input(s): PHART, PO2ART, TCO2, HCO3 in the last 72 hours.  Invalid input(s): PCO2 CULTURES No results found for this or any previous visit (from the past 240 hour(s)). Studies/Results: Ct Abdomen Pelvis Wo Contrast  05/10/2015  CLINICAL DATA:  Right upper quadrant pain for 4 days  with black diarrhea with nausea and vomiting EXAM: CT ABDOMEN AND PELVIS WITHOUT CONTRAST TECHNIQUE: Multidetector CT imaging of the abdomen and pelvis was performed following the standard protocol without IV contrast. COMPARISON:  10/09/2001 FINDINGS: Lower chest:  Negative Hepatobiliary: Negative Pancreas: Negative Spleen: Negative Adrenals/Urinary Tract: 2 mm nonobstructing stone lower pole right kidney. Adrenal glands and kidneys otherwise negative. Bladder negative. Stomach/Bowel: Appendix is normal. There is a nonobstructive bowel gas pattern. There are air-fluid levels throughout the large bowel into  the rectum consistent with diarrhea type illness. The large bowel is otherwise negative. Small bowel is normal. Stomach is normal. There is fluid in the distal esophagus consistent with reflux. Vascular/Lymphatic: No significant adenopathy in the abdomen or pelvis. Mild aortic calcification. Reproductive: Negative Other: No ascites Musculoskeletal: No acute osseous abnormalities IMPRESSION: Findings consistent with diarrhea type illness with no specific abnormalities. Electronically Signed   By: Skipper Cliche M.D.   On: 05/10/2015 20:45   Dg Chest 2 View  05/10/2015  CLINICAL DATA:  Syncope. Abrasion along the right forehead. Right upper quadrant abdominal pain. EXAM: CHEST  2 VIEW COMPARISON:  03/03/2014 FINDINGS: Tapering of the peripheral pulmonary vasculature favors emphysema. Cardiac and mediastinal margins appear normal. The lungs appear otherwise clear. Right C7 cervical rib and partial left C7 cervical rib. IMPRESSION: 1. Emphysema. 2. Bilateral cervical ribs incidentally noted. Electronically Signed   By: Van Clines M.D.   On: 05/10/2015 20:25   Ct Head Wo Contrast  05/10/2015  CLINICAL DATA:  Syncope and fall. Right forehead abrasion. Initial encounter. EXAM: CT HEAD WITHOUT CONTRAST CT CERVICAL SPINE WITHOUT CONTRAST TECHNIQUE: Multidetector CT imaging of the head and cervical spine was  performed following the standard protocol without intravenous contrast. Multiplanar CT image reconstructions of the cervical spine were also generated. COMPARISON:  Cervical spine MRI 05/29/2013 FINDINGS: CT HEAD FINDINGS Skull and Sinuses:Negative for fracture. Diffuse paranasal sinus mucosal thickening with low-density fluid levels in the left sphenoid and maxillary sinuses. Visualized orbits: Negative. Brain: Unremarkable. No evidence of acute infarction, hemorrhage, hydrocephalus, or mass lesion/mass effect. CT CERVICAL SPINE FINDINGS Negative for acute fracture or subluxation. No prevertebral edema. No gross cervical canal hematoma. Spondylosis and facet arthropathy. Multilevel foraminal stenosis, left more than right. Foraminal narrowing is particularly severe bilaterally at C2-3. 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 C1-2 alignment is stable from comparison MRI. Apical emphysema. IMPRESSION: 1. No evidence of acute intracranial or cervical spine injury. 2. Sinusitis with left sphenoid and maxillary fluid levels. 3. 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 Electronically Signed   By: Monte Fantasia M.D.   On: 05/10/2015 20:44   Ct Cervical Spine Wo Contrast  05/10/2015  CLINICAL DATA:  Syncope and fall. Right forehead abrasion. Initial encounter. EXAM: CT HEAD WITHOUT CONTRAST CT CERVICAL SPINE WITHOUT CONTRAST TECHNIQUE: Multidetector CT imaging of the head and cervical spine was performed following the standard protocol without intravenous contrast. Multiplanar CT image reconstructions of the cervical spine were also generated. COMPARISON:  Cervical spine MRI 05/29/2013 FINDINGS: CT HEAD FINDINGS Skull and Sinuses:Negative for fracture. Diffuse paranasal sinus mucosal thickening with low-density fluid levels in the left sphenoid and maxillary sinuses. Visualized orbits: Negative. Brain: Unremarkable. No evidence  of acute infarction, hemorrhage, hydrocephalus, or mass lesion/mass effect. CT CERVICAL SPINE FINDINGS Negative for acute fracture or subluxation. No prevertebral edema. No gross cervical canal hematoma. Spondylosis and facet arthropathy. Multilevel foraminal stenosis, left more than right. Foraminal narrowing is particularly severe bilaterally at C2-3. 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 C1-2 alignment is stable from comparison MRI. Apical emphysema. IMPRESSION: 1. No evidence of acute intracranial or cervical spine injury. 2. Sinusitis with left sphenoid and maxillary fluid levels. 3. 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 Electronically Signed   By: Monte Fantasia M.D.   On: 05/10/2015 20:44    Medications:  Prior to Admission:  Prescriptions prior to admission  Medication Sig  Dispense Refill Last Dose  . folic acid (FOLVITE) 1 MG tablet Take 1 mg by mouth daily.   05/10/2015 at Unknown time  . lisinopril (PRINIVIL,ZESTRIL) 10 MG tablet Take 10 mg by mouth daily.   05/10/2015 at Unknown time  . methotrexate (RHEUMATREX) 2.5 MG tablet Take 5 mg by mouth once a week. saturday   05/02/2015   Scheduled: . chlordiazePOXIDE  50 mg Oral QID  . folic acid  1 mg Oral Daily  . LORazepam  0-4 mg Intravenous Q6H   Followed by  . LORazepam  0-4 mg Intravenous Q12H  . multivitamin with minerals  1 tablet Oral Daily  . pantoprazole  40 mg Oral BID AC  . thiamine  100 mg Oral Daily   Or  . thiamine  100 mg Intravenous Daily   Continuous: . sodium chloride 100 mL/hr at 05/12/15 4037   VOH:[id number] **OR** acetaminophen, alum & mag hydroxide-simeth, HYDROmorphone (DILAUDID) injection, LORazepam **OR** LORazepam, ondansetron **OR** ondansetron (ZOFRAN) IV, oxyCODONE  Assesment: He was admitted with syncope and a GI bleed. 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 He is doing better. Principal Problem:   GI bleed Active Problems:   Syncope and collapse   RUQ abdominal pain   Alcohol abuse   COPD (chronic obstructive pulmonary disease) (HCC)   Acute pancreatitis   AKI (acute kidney injury) ([place])   Hyponatremia    Plan: For EGD today.    LOS: 2 days   Katerine Morua L 05/12/2015, 9:01 AM

## 2015-05-12 NOTE — Anesthesia Procedure Notes (Signed)
Procedure Name: MAC Date/Time: 05/12/2015 12:21 PM Performed by: Vista Deck Pre-anesthesia Checklist: Patient identified, Emergency Drugs available, Suction available, Timeout performed and Patient being monitored Patient Re-evaluated:Patient Re-evaluated prior to inductionOxygen Delivery Method: Non-rebreather mask Airway Equipment and Method: Bite block

## 2015-05-12 NOTE — Transfer of Care (Signed)
Immediate Anesthesia Transfer of Care Note  Patient: Shane Allison  Procedure(s) Performed: Procedure(s) with comments: ESOPHAGOGASTRODUODENOSCOPY (EGD) WITH PROPOFOL (N/A) BIOPSY - gastric biopsy  Patient Location: PACU  Anesthesia Type:MAC  Level of Consciousness: awake and patient cooperative  Airway & Oxygen Therapy: Patient Spontanous Breathing and Patient connected to nasal cannula oxygen  Post-op Assessment: Report given to RN, Post -op Vital signs reviewed and stable and Patient moving all extremities  Post vital signs: Reviewed and stable    Complications: No apparent anesthesia complications

## 2015-05-12 NOTE — Clinical Documentation Improvement (Signed)
Pulmonology  Conflicting documentation regarding Acute Pancreatitis. Based on the clinical findings below, please document in next progress note any associated diagnoses/conditions the patient has or may have.   Acute Pancreatitis ruled in  Acute Pancreatitis ruled out  Other  Clinically Undetermined  Supporting Information:  "He has what appears to be pancreatitis based on elevated lipase" seen in 3/20 progress note.   "Minimally elevated lipase not indicative of pancreatitis (61 and 54)" documented in GI Consult.   Please exercise your independent, professional judgment when responding. A specific answer is not anticipated or expected.  Thank You, Zoila Shutter RN, BSN, [place] [phone]; Cell: [phone]

## 2015-05-12 NOTE — H&P (Signed)
  Primary Care Physician:  Alonza Bogus, MD Primary Gastroenterologist:  Dr. Oneida Alar  Pre-Procedure History & Physical: HPI:  Shane Allison is a 50 y.o. male here for BLOODY NOSE FOLLOWED BY BL[facility]. USES NSAIDS.  Past Medical History  Diagnosis Date  . Hypertension   . Psoriasis   . COPD (chronic obstructive pulmonary disease) ([place])   . Chronic neck pain   . Cervical radiculopathy   . Alcohol use [facility])     Past Surgical History  Procedure Laterality Date  . Hand tendon surgery    . Thumb amputation      Partial left thumb     Prior to Admission medications   Medication Sig Start Date End Date Taking? Authorizing Provider  folic acid (FOLVITE) 1 MG tablet Take 1 mg by mouth daily. 04/09/15  Yes Historical Provider, MD  lisinopril (PRINIVIL,ZESTRIL) 10 MG tablet Take 10 mg by mouth daily.   Yes Historical Provider, MD  methotrexate (RHEUMATREX) 2.5 MG tablet Take 5 mg by mouth once a week. saturday 04/09/15  Yes Historical Provider, MD    Allergies as of 05/10/2015 - Review Complete 05/10/2015  Allergen Reaction Noted  . Penicillins Rash 09/25/2010    Family History  Problem Relation Age of Onset  . Colon cancer Neg Hx     Social History   Social History  . Marital Status: Single    Spouse Name: N/A  . Number of Children: N/A  . Years of Education: N/A   Occupational History  . Not on file.   Social History Main Topics  . Smoking status: Current Every Day Smoker -- 3.00 packs/day    Types: Cigarettes  . Smokeless tobacco: Not on file     Comment: 3 packs a day normally, but states "lately I'm down to 1" as of March 2017   . Alcohol Use: 0.0 oz/week    0 Standard drinks or equivalent per week     Comment: 6-12 pack beer at least 5 days a week   . Drug Use: No  . Sexual Activity: Not on file   Other Topics Concern  . Not on file   Social History Narrative    Review of Systems: See HPI, otherwise negative ROS   Physical Exam: BP 90/60 mmHg   Pulse 87  Temp(Src) 98 F (36.7 C) (Oral)  Resp 20  Ht 5\' 1"  (1.549 m)  Wt 136 lb 14.4 oz (62.097 kg)  BMI 25.88 kg/m2  SpO2 97% General:   Alert,  pleasant and cooperative in NAD Head:  Normocephalic and atraumatic. Neck:  Supple; Lungs:  Clear throughout to auscultation.    Heart:  Regular rate and rhythm. Abdomen:  Soft, nontender and nondistended. Normal bowel sounds, without guarding, and without rebound.   Neurologic:  Alert and  oriented x4;  grossly normal neurologically.  Impression/Plan:    BLOODY NOSE FOLLOWED BY [facility]. USES NSAIDS.  PLAN: 1. EGD TODAY

## 2015-05-12 NOTE — Care Management Note (Signed)
Case Management Note  Patient Details  Name: Shane Allison MRN: [id number] Date of Birth: February 06, 1966  Subjective/Objective:                  Pt admitted with GIB. Pt is from home, ind with ADL's. Pt has insurance, PCP, transportation to appointments and no difficulty obtaining medications. Pt plans to return home with self care at DC.   Action/Plan: Pt discharging home today with self care. No CM needs.   Expected Discharge Date:    05/12/2015              Expected Discharge Plan:  Home/Self Care  In-House Referral:  NA  Discharge planning Services  CM Consult  Post Acute Care Choice:  NA Choice offered to:  NA  DME Arranged:    DME Agency:     HH Arranged:    HH Agency:     Status of Service:  Completed, signed off  Medicare Important Message Given:    Date Medicare IM Given:    Medicare IM give by:    Date Additional Medicare IM Given:    Additional Medicare Important Message give by:     If discussed at [place] of Stay Meetings, dates discussed:    Additional Comments:  Sherald Barge, RN 05/12/2015, 3:39 PM

## 2015-05-12 NOTE — Progress Notes (Signed)
Shane Allison discharged Home per MD order.  Discharge instructions reviewed and discussed with the patient, all questions and concerns answered. Copy of instructions and scripts given to patient.    Medication List    TAKE these medications        folic acid 1 MG tablet  Commonly known as:  FOLVITE  Take 1 mg by mouth daily.     lisinopril 10 MG tablet  Commonly known as:  PRINIVIL,ZESTRIL  Take 10 mg by mouth daily.     methotrexate 2.5 MG tablet  Commonly known as:  RHEUMATREX  Take 5 mg by mouth once a week. saturday     pantoprazole 40 MG tablet  Commonly known as:  PROTONIX  Take 1 tablet (40 mg total) by mouth 2 (two) times daily before a meal.        Patients skin is clean, dry and intact, no evidence of skin break down. IV site discontinued and catheter remains intact. Site without signs and symptoms of complications. Dressing and pressure applied.  Patient escorted to car by NT in a wheelchair,  no distress noted upon discharge.  Ralene Muskrat [name] 05/12/2015 5:02 PM

## 2015-05-12 NOTE — Anesthesia Postprocedure Evaluation (Signed)
Anesthesia Post Note  Patient: Shane Allison  Procedure(s) Performed: Procedure(s) (LRB): ESOPHAGOGASTRODUODENOSCOPY (EGD) WITH PROPOFOL (N/A) BIOPSY  Patient location during evaluation: PACU Anesthesia Type: MAC Level of consciousness: awake and alert Pain management: pain level controlled Vital Signs Assessment: post-procedure vital signs reviewed and stable Respiratory status: spontaneous breathing Cardiovascular status: stable Anesthetic complications: no    Last Vitals:  Filed Vitals:   05/12/15 1340 05/12/15 1345  BP:    Pulse: 67 79  Temp: 36.8 C   Resp: 20 20    Last Pain:  Filed Vitals:   05/12/15 1347  PainSc: 0-No pain                 Kashauna Celmer

## 2015-05-12 NOTE — Op Note (Signed)
[facility] Patient Name: Shane Allison Procedure Date: 05/12/2015 12:21 PM MRN: [id number] Date of Birth: 07-04-1965 Attending MD: Barney Drain , MD CSN: [id number] Age: 50 Admit Type: Outpatient Procedure:                Upper GI endoscopy Indications:              Melena Providers:                Barney Drain, MD, Janeece Riggers, RN, Randa Spike,                            Technician Referring MD:             Jasper Loser. Luan Pulling, MD (Referring MD) Medicines:                Propofol per Anesthesia Complications:            No immediate complications. Estimated blood loss:                            Minimal. Estimated Blood Loss:     Estimated blood loss was minimal. Procedure:                Pre-Anesthesia Assessment:                           - Prior to the procedure, a History and Physical                            was performed, and patient medications and                            allergies were reviewed. The patient's tolerance of                            previous anesthesia was also reviewed. The risks                            and benefits of the procedure and the sedation                            options and risks were discussed with the patient.                            All questions were answered, and informed consent                            was obtained. Prior Anticoagulants: The patient has                            taken no previous anticoagulant or antiplatelet                            agents. ASA Grade Assessment: II - A patient with  mild systemic disease. After reviewing the risks                            and benefits, the patient was deemed in                            satisfactory condition to undergo the procedure.                           After obtaining informed consent, the endoscope was                            passed under direct vision. Throughout the                            procedure, the patient's  blood pressure, pulse, and                            oxygen saturations were monitored continuously. The                            [id number] scope was introduced through the                            mouth, and advanced to the second part of duodenum.                            The upper GI endoscopy was accomplished without                            difficulty. The patient tolerated the procedure                            fairly well. Scope In: 12:52:50 PM Scope Out: 12:58:15 PM Total Procedure Duration: 0 hours 5 minutes 25 seconds  Findings:      Diffuse moderate inflammation characterized by erosions and erythema was       found at the gastroesophageal junction, in the gastric body and in the       gastric antrum. Biopsies were taken with a cold forceps for Helicobacter       pylori testing.      Patchy moderately friable mucosa with active bleeding was found in the       duodenal bulb, in the first portion of the duodenum and in the second       portion of the duodenum. Estimated blood loss was minimal.      Non-severe esophagitis was found.      A small hiatal hernia was present. Impression:               - Normal esophagus.                           - Gastritis. Biopsied.                           - Bleeding friable duodenal mucosa. Moderate Sedation:  Per Anesthesia Care Recommendation:           - Patient has a contact number available for                            emergencies. The signs and symptoms of potential                            delayed complications were discussed with the                            patient. Return to normal activities tomorrow.                            Written discharge instructions were provided to the                            patient.                           - Low fat diet.                           - Use Protonix (pantoprazole) 40 mg PO BID.                           - Await pathology results.                            - Return to my office in 6 months.                           - Continue present medications. Procedure Code(s):        --- Professional ---                           [phone], Esophagogastroduodenoscopy, flexible,                            transoral; with biopsy, single or multiple Diagnosis Code(s):        --- Professional ---                           K29.70, Gastritis, unspecified, without bleeding                           K92.2, Gastrointestinal hemorrhage, unspecified                           K92.1, Melena (includes Hematochezia) CPT copyright 2016 American [employer] Association. All rights reserved. The codes documented in this report are preliminary and upon coder review may  be revised to meet current compliance requirements. Barney Drain, MD Barney Drain, MD 05/12/2015 3:59:46 PM This report has been signed electronically. Number of Addenda: 0

## 2015-05-12 NOTE — Anesthesia Preprocedure Evaluation (Signed)
Anesthesia Evaluation  Patient identified by MRN, date of birth, ID band Patient awake    Reviewed: Allergy & Precautions, NPO status , Patient's Chart, lab work & pertinent test results  Airway Mallampati: II  TM Distance: >3 FB     Dental  (+) Poor Dentition, Chipped,    Pulmonary shortness of breath and with exertion, asthma , COPD,  COPD inhaler, Current Smoker,     + decreased breath sounds      Cardiovascular hypertension, Pt. on medications Normal cardiovascular exam     Neuro/Psych  Headaches,  Neuromuscular disease    GI/Hepatic (+)     substance abuse  alcohol use,   Endo/Other    Renal/GU Renal InsufficiencyRenal disease     Musculoskeletal  (+) Arthritis , Osteoarthritis,    Abdominal Normal abdominal exam  (+)   Peds  Hematology   Anesthesia Other Findings   Reproductive/Obstetrics                             Anesthesia Physical Anesthesia Plan  ASA: III  Anesthesia Plan: MAC   Post-op Pain Management:    Induction: Intravenous  Airway Management Planned: Nasal Cannula  Additional Equipment:   Intra-op Plan:   Post-operative Plan:   Informed Consent: I have reviewed the patients History and Physical, chart, labs and discussed the procedure including the risks, benefits and alternatives for the proposed anesthesia with the patient or authorized representative who has indicated his/her understanding and acceptance.     Plan Discussed with: CRNA  Anesthesia Plan Comments:         Anesthesia Quick Evaluation

## 2015-05-13 NOTE — Discharge Summary (Signed)
Physician Discharge Summary  Patient ID: Shane Allison MRN: [id number] DOB/AGE: [birth year] 50 y.o. Primary Care Physician:Cortlandt Capuano L, MD Admit date: 05/10/2015 Discharge date: 05/13/2015    Discharge Diagnoses:   Principal Problem:   GI bleed Active Problems:   Syncope and collapse   RUQ abdominal pain   Alcohol abuse   COPD (chronic obstructive pulmonary disease) (HCC)   Acute pancreatitis   AKI (acute kidney injury) ([place])   Hyponatremia Psoriasis Esophagitis Gastritis Friable duodenal mucosa with bleeding   Medication List    TAKE these medications        folic acid 1 MG tablet  Commonly known as:  FOLVITE  Take 1 mg by mouth daily.     lisinopril 10 MG tablet  Commonly known as:  PRINIVIL,ZESTRIL  Take 10 mg by mouth daily.     methotrexate 2.5 MG tablet  Commonly known as:  RHEUMATREX  Take 5 mg by mouth once a week. saturday     pantoprazole 40 MG tablet  Commonly known as:  PROTONIX  Take 1 tablet (40 mg total) by mouth 2 (two) times daily before a meal.        Discharged Condition:Improved    Consults: GI  Significant Diagnostic Studies: Ct Abdomen Pelvis Wo Contrast  05/10/2015  CLINICAL DATA:  Right upper quadrant pain for 4 days with black diarrhea with nausea and vomiting EXAM: CT ABDOMEN AND PELVIS WITHOUT CONTRAST TECHNIQUE: Multidetector CT imaging of the abdomen and pelvis was performed following the standard protocol without IV contrast. COMPARISON:  10/09/2001 FINDINGS: Lower chest:  Negative Hepatobiliary: Negative Pancreas: Negative Spleen: Negative Adrenals/Urinary Tract: 2 mm nonobstructing stone lower pole right kidney. Adrenal glands and kidneys otherwise negative. Bladder negative. Stomach/Bowel: Appendix is normal. There is a nonobstructive bowel gas pattern. There are air-fluid levels throughout the large bowel into the rectum consistent with diarrhea type illness. The large bowel is otherwise negative. Small bowel is normal.  Stomach is normal. There is fluid in the distal esophagus consistent with reflux. Vascular/Lymphatic: No significant adenopathy in the abdomen or pelvis. Mild aortic calcification. Reproductive: Negative Other: No ascites Musculoskeletal: No acute osseous abnormalities IMPRESSION: Findings consistent with diarrhea type illness with no specific abnormalities. Electronically Signed   By: Skipper Cliche M.D.   On: 05/10/2015 20:45   Dg Chest 2 View  05/10/2015  CLINICAL DATA:  Syncope. Abrasion along the right forehead. Right upper quadrant abdominal pain. EXAM: CHEST  2 VIEW COMPARISON:  03/03/2014 FINDINGS: Tapering of the peripheral pulmonary vasculature favors emphysema. Cardiac and mediastinal margins appear normal. The lungs appear otherwise clear. Right C7 cervical rib and partial left C7 cervical rib. IMPRESSION: 1. Emphysema. 2. Bilateral cervical ribs incidentally noted. Electronically Signed   By: Van Clines M.D.   On: 05/10/2015 20:25   Ct Head Wo Contrast  05/10/2015  CLINICAL DATA:  Syncope and fall. Right forehead abrasion. Initial encounter. EXAM: CT HEAD WITHOUT CONTRAST CT CERVICAL SPINE WITHOUT CONTRAST TECHNIQUE: Multidetector CT imaging of the head and cervical spine was performed following the standard protocol without intravenous contrast. Multiplanar CT image reconstructions of the cervical spine were also generated. COMPARISON:  Cervical spine MRI 05/29/2013 FINDINGS: CT HEAD FINDINGS Skull and Sinuses:Negative for fracture. Diffuse paranasal sinus mucosal thickening with low-density fluid levels in the left sphenoid and maxillary sinuses. Visualized orbits: Negative. Brain: Unremarkable. No evidence of acute infarction, hemorrhage, hydrocephalus, or mass lesion/mass effect. CT CERVICAL SPINE FINDINGS Negative for acute fracture or subluxation. No prevertebral edema. No gross cervical  canal hematoma. Spondylosis and facet arthropathy. Multilevel foraminal stenosis, left more than  right. Foraminal narrowing is particularly severe bilaterally at C2-3. 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 C1-2 alignment is stable from comparison MRI. Apical emphysema. IMPRESSION: 1. No evidence of acute intracranial or cervical spine injury. 2. Sinusitis with left sphenoid and maxillary fluid levels. 3. 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 Electronically Signed   By: Monte Fantasia M.D.   On: 05/10/2015 20:44   Ct Cervical Spine Wo Contrast  05/10/2015  CLINICAL DATA:  Syncope and fall. Right forehead abrasion. Initial encounter. EXAM: CT HEAD WITHOUT CONTRAST CT CERVICAL SPINE WITHOUT CONTRAST TECHNIQUE: Multidetector CT imaging of the head and cervical spine was performed following the standard protocol without intravenous contrast. Multiplanar CT image reconstructions of the cervical spine were also generated. COMPARISON:  Cervical spine MRI 05/29/2013 FINDINGS: CT HEAD FINDINGS Skull and Sinuses:Negative for fracture. Diffuse paranasal sinus mucosal thickening with low-density fluid levels in the left sphenoid and maxillary sinuses. Visualized orbits: Negative. Brain: Unremarkable. No evidence of acute infarction, hemorrhage, hydrocephalus, or mass lesion/mass effect. CT CERVICAL SPINE FINDINGS Negative for acute fracture or subluxation. No prevertebral edema. No gross cervical canal hematoma. Spondylosis and facet arthropathy. Multilevel foraminal stenosis, left more than right. Foraminal narrowing is particularly severe bilaterally at C2-3. 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 C1-2 alignment is stable from comparison MRI. Apical emphysema. IMPRESSION: 1. No evidence of acute intracranial or cervical spine injury. 2. Sinusitis with left sphenoid and maxillary fluid levels. 3. Diffuse cervical spine degeneration with multilevel foraminal  stenosis. Electronically Signed   By: Monte Fantasia M.D.   On: 05/10/2015 20:44    Lab Results: Basic Metabolic Panel:  Recent Labs  05/10/15 1829 05/11/15 0543  NA 126* 130*  K 4.7 4.3  CL 96* 105  CO2 22 19*  GLUCOSE 129* 84  BUN 24* 18  CREATININE 1.45* 0.90  CALCIUM 8.5* 7.7*   Liver Function Tests:  Recent Labs  05/10/15 1829  AST 43*  ALT 33  ALKPHOS 61  BILITOT 0.4  PROT 7.2  ALBUMIN 3.9     CBC:  Recent Labs  05/11/15 0543 05/11/15 1438 05/12/15 0456  WBC 3.3*  --  4.0  HGB 12.8* 12.6* 12.9*  HCT 37.1* 36.2* 37.4*  MCV 88.5  --  89.7  PLT 170  --  170    No results found for this or any previous visit (from the past 240 hour(s)).   Hospital Course: This is a 50 year old who had a syncopal episode at home. He was brought to the emergency department and was found to have heme positive stool with complaints of dark bowel movements. He is felt to have GI bleeding. He was hemodynamically stable. In addition to the problem with his GI bleeding he may have been having withdrawal from alcohol. He was started on the CIWA protocol and had monitoring of his hemoglobin level. GI consultation was obtained. He underwent EGD on the 21st and was found to have esophagitis, gastritis and friable duodenal mucosa with evidence of bleeding. He was not felt to have a severe bleed his hemoglobin remained stable and he remained hemodynamically stable. He was discharged after the endoscopy. He was told he needs to eliminate alcohol and I gave him information about Alcoholics Anonymous and mental health services. He also needs to avoid anti-inflammatories.  Discharge Exam: Blood pressure 119/80, pulse  76, temperature 97.8 F (36.6 C), temperature source Oral, resp. rate 20, height 5\' 1"  (1.549 m), weight 61.689 kg (136 lb), SpO2 100 %. He is awake and alert. He does not show any signs of alcohol withdrawal now. His chest shows some rhonchi.  Disposition: Home he will follow-up  in my office      Signed: Kumari Sculley L   05/13/2015, 7:13 AM

## 2015-05-14 ENCOUNTER — Encounter (HOSPITAL_COMMUNITY): Payer: Self-pay | Admitting: Gastroenterology

## 2015-06-07 ENCOUNTER — Telehealth: Payer: Self-pay | Admitting: Gastroenterology

## 2015-06-07 NOTE — Telephone Encounter (Signed)
Please call pt. His stomach Bx shows gastritis.   AVOID ALCOHOL. MINIIMIZE YOUR USE OF ASPIRON PRODUCTS AND NSAIDS. FOLLOW A Low fat diet. Use Protonix (pantoprazole) 40 mg PO BID. -Return to my office in 6 months E30 ETOH GASTRITIS/NORMOCYTIC ANEMIA/EPISTAXIS.

## 2015-06-08 NOTE — Telephone Encounter (Signed)
ON RECALL  °

## 2015-06-09 NOTE — Telephone Encounter (Signed)
Pt is aware.  

## 2015-06-09 NOTE — Telephone Encounter (Signed)
L/M to call.

## 2015-06-09 NOTE — Telephone Encounter (Signed)
LM with family member to have pt call

## 2015-08-04 ENCOUNTER — Ambulatory Visit (INDEPENDENT_AMBULATORY_CARE_PROVIDER_SITE_OTHER): Payer: Medicaid Other

## 2015-08-04 ENCOUNTER — Encounter: Payer: Self-pay | Admitting: Orthopaedic Surgery

## 2015-08-04 ENCOUNTER — Ambulatory Visit (INDEPENDENT_AMBULATORY_CARE_PROVIDER_SITE_OTHER): Payer: Medicaid Other | Admitting: Orthopaedic Surgery

## 2015-08-04 VITALS — BP 161/104 | HR 82 | Temp 98.8°F | Ht 67.0 in | Wt 143.4 lb

## 2015-08-04 DIAGNOSIS — M25511 Pain in right shoulder: Secondary | ICD-10-CM

## 2015-08-04 DIAGNOSIS — J449 Chronic obstructive pulmonary disease, unspecified: Secondary | ICD-10-CM

## 2015-08-04 NOTE — Progress Notes (Signed)
Subjective:  My right shoulder hurts    Patient ID: Shane Allison, male    DOB: [birth year], 50 y.o.   MRN: [id number]  Shoulder Pain  The pain is present in the right shoulder. This is a chronic problem. The current episode started more than 1 year ago. There has been no history of extremity trauma. The problem occurs daily. The problem has been gradually worsening. The quality of the pain is described as aching. The pain is at a severity of 3/10. The pain is mild. The symptoms are aggravated by activity and cold. He has tried heat, cold, acetaminophen, rest and oral narcotics for the symptoms. The treatment provided mild relief.   He has a long history of right shoulder pain. He had it evaluated in 2015 by Dr. Alphonzo Cruise in [place] and had a MRI on 03-07-13 here at the hospital showing rotator cuff tendinopathy but no tear.  He was given injection by Dr. Alphonzo Cruise.  He has severe GERD and was recently hospitalized for this.  He cannot take any NSAID orally.  He saw Dr. Luan Pulling on 07-13-15 for the shoulder on the right.  I have copies of the notes and have reviewed them.  Dr. Luan Pulling had him come here for the shoulder. He has no neck pain.  He has no paresthesias, no redness, but he has pain with overhead use.  He is currently unemployed.  He has tried exercises, heat, ice, rubs with no relief.  Dr. Luan Pulling has him on pain medicine.  He has marked COPD which limits his activities.     Review of Systems  HENT: Negative for congestion.   Respiratory: Positive for cough and shortness of breath.   Cardiovascular: Negative for chest pain and leg swelling.  Endocrine: Negative for cold intolerance.  Musculoskeletal: Positive for myalgias and arthralgias.  Allergic/Immunologic: Positive for environmental allergies.   Past Medical History  Diagnosis Date  . Hypertension   . Psoriasis   . COPD (chronic obstructive pulmonary disease) ([place])   . Chronic neck pain   . Cervical radiculopathy   .  Alcohol use ([place])   . Asthma   . Shortness of breath dyspnea     Past Surgical History  Procedure Laterality Date  . Hand tendon surgery    . Thumb amputation      Partial left thumb   . Esophagogastroduodenoscopy (egd) with propofol N/A 05/12/2015    Procedure: ESOPHAGOGASTRODUODENOSCOPY (EGD) WITH PROPOFOL;  Surgeon: Danie Binder, MD;  Location: [facility];  Service: Endoscopy;  Laterality: N/A;  . Biopsy  05/12/2015    Procedure: BIOPSY;  Surgeon: Danie Binder, MD;  Location: [facility];  Service: Endoscopy;;  gastric biopsy    Current Outpatient Prescriptions on File Prior to Visit  Medication Sig Dispense Refill  . folic acid (FOLVITE) 1 MG tablet Take 1 mg by mouth daily.    Marland Kitchen lisinopril (PRINIVIL,ZESTRIL) 10 MG tablet Take 10 mg by mouth daily.    . methotrexate (RHEUMATREX) 2.5 MG tablet Take 5 mg by mouth once a week. saturday    . pantoprazole (PROTONIX) 40 MG tablet Take 1 tablet (40 mg total) by mouth 2 (two) times daily before a meal. 60 tablet 5   No current facility-administered medications on file prior to visit.    Social History   Social History  . Marital Status: Single    Spouse Name: N/A  . Number of Children: N/A  . Years of Education: N/A   Occupational  History  . Not on file.   Social History Main Topics  . Smoking status: Current Every Day Smoker -- 3.00 packs/day    Types: Cigarettes  . Smokeless tobacco: Not on file     Comment: 3 packs a day normally, but states "lately I'm down to 1" as of March 2017   . Alcohol Use: 0.0 oz/week    0 Standard drinks or equivalent per week     Comment: 6-12 pack beer at least 5 days a week   . Drug Use: No  . Sexual Activity: Not on file   Other Topics Concern  . Not on file   Social History Narrative    BP 161/104 mmHg  Pulse 82  Temp(Src) 98.8 F (37.1 C)  Ht 5\' 7"  (1.702 m)  Wt 143 lb 6.4 oz (65.046 kg)  BMI 22.45 kg/m2     Objective:   Physical Exam  Constitutional: He is  oriented to person, place, and time. He appears well-developed and well-nourished.  HENT:  Head: Normocephalic and atraumatic.  Eyes: Conjunctivae and EOM are normal. Pupils are equal, round, and reactive to light.  Neck: Normal range of motion. Neck supple.  Cardiovascular: Normal rate, regular rhythm and intact distal pulses.   Pulmonary/Chest: Effort normal.  Abdominal: Soft.  Musculoskeletal: He exhibits tenderness (He has tenderness of the right shoulder.  He has full motion but pain to resisted abduction and rotation.  Left shoulder and neck normal.  Grips normal.).       Right shoulder: He exhibits tenderness.       Arms: Neurological: He is alert and oriented to person, place, and time. He has normal reflexes. No cranial nerve deficit. He exhibits normal muscle tone. Coordination normal.  Skin: Skin is warm and dry.  Psychiatric: He has a normal mood and affect. His behavior is normal. Judgment and thought content normal.   X-rays were done of the right shoulder, reported separately.  PROCEDURE NOTE:  The patient request injection, verbal consent was obtained.  The right shoulder was prepped appropriately after time out was performed.   Sterile technique was observed and injection of 1 cc of Depo-Medrol 40 mg with several cc's of plain xylocaine. Anesthesia was provided by ethyl chloride and a 20-gauge needle was used to inject the shoulder area. A posterior approach was used.  The injection was tolerated well.  A band aid dressing was applied.  The patient was advised to apply ice later today and tomorrow to the injection sight as needed.       Assessment & Plan:   Encounter Diagnoses  Name Primary?  . Right shoulder pain Yes  . Chronic obstructive pulmonary disease, unspecified COPD type ([place])     I will see him back in three weeks.  He may need another MRI then.  He cannot take any NSAIDs.  Call if any problem.  Precautions discussed.  Electronically  Signed Sanjuana Kava, MD 6/13/20179:41 AM

## 2015-08-26 ENCOUNTER — Encounter: Payer: Self-pay | Admitting: Orthopaedic Surgery

## 2015-08-26 ENCOUNTER — Other Ambulatory Visit: Payer: Self-pay | Admitting: Radiology

## 2015-08-26 ENCOUNTER — Ambulatory Visit (INDEPENDENT_AMBULATORY_CARE_PROVIDER_SITE_OTHER): Payer: Medicaid Other | Admitting: Orthopaedic Surgery

## 2015-08-26 VITALS — BP 145/96 | HR 90 | Temp 100.2°F | Ht 67.0 in | Wt 142.5 lb

## 2015-08-26 DIAGNOSIS — J449 Chronic obstructive pulmonary disease, unspecified: Secondary | ICD-10-CM

## 2015-08-26 DIAGNOSIS — M25511 Pain in right shoulder: Secondary | ICD-10-CM

## 2015-08-26 NOTE — Progress Notes (Signed)
Patient Shane Allison, male DOB:Mar 22, 1965, 50 y.o. [id number]  Chief Complaint  Patient presents with  . Follow-up    HPI  Shane Allison is a 50 y.o. male who continues to have right shoulder pain.  The injection I gave him last time did not help him very much.  He has pain with overhead use and rolling over on it at night.  He has no new trauma, no paresthesias.  He saw Dr. Luan Pulling for this problem on 07-13-15.  He had seen Dr. Alphonzo Cruise for this in 2015.  His pain is still deep.    I will get a MRI of the shoulder.  He continues to smoke.  I have talked to him about this.  I will given smoking stopping information again.  HPI  Body mass index is 22.31 kg/(m^2).  ROS  Review of Systems  HENT: Negative for congestion.   Respiratory: Positive for cough and shortness of breath.   Cardiovascular: Negative for chest pain and leg swelling.  Endocrine: Negative for cold intolerance.  Musculoskeletal: Positive for myalgias and arthralgias.  Allergic/Immunologic: Positive for environmental allergies.    Past Medical History  Diagnosis Date  . Hypertension   . Psoriasis   . COPD (chronic obstructive pulmonary disease) ([place])   . Chronic neck pain   . Cervical radiculopathy   . Alcohol use ([place])   . Asthma   . Shortness of breath dyspnea     Past Surgical History  Procedure Laterality Date  . Hand tendon surgery    . Thumb amputation      Partial left thumb   . Esophagogastroduodenoscopy (egd) with propofol N/A 05/12/2015    Procedure: ESOPHAGOGASTRODUODENOSCOPY (EGD) WITH PROPOFOL;  Surgeon: Danie Binder, MD;  [facility];  Service: Endoscopy;  Laterality: N/A;  . Biopsy  05/12/2015    Procedure: BIOPSY;  Surgeon: Danie Binder, MD;  [facility];  Service: Endoscopy;;  gastric biopsy    Family History  Problem Relation Age of Onset  . Colon cancer Neg Hx     Social History Social History  Substance Use Topics  . Smoking status:  Current Every Day Smoker -- 3.00 packs/day    Types: Cigarettes  . Smokeless tobacco: None     Comment: 3 packs a day normally, but states "lately I'm down to 1" as of March 2017   . Alcohol Use: 0.0 oz/week    0 Standard drinks or equivalent per week     Comment: 6-12 pack beer at least 5 days a week     Allergies  Allergen Reactions  . Penicillins Rash    Has patient had a PCN reaction causing immediate rash, facial/tongue/throat swelling, SOB or lightheadedness with hypotension: Yes Has patient had a PCN reaction causing severe rash involving mucus membranes or skin necrosis: No Has patient had a PCN reaction that required hospitalization No Has patient had a PCN reaction occurring within the last 10 years: No If all of the above answers are "NO", then may proceed with Cephalosporin use.      Current Outpatient Prescriptions  Medication Sig Dispense Refill  . folic acid (FOLVITE) 1 MG tablet Take 1 mg by mouth daily.    Marland Kitchen lisinopril (PRINIVIL,ZESTRIL) 10 MG tablet Take 10 mg by mouth daily.    Marland Kitchen losartan (COZAAR) 50 MG tablet Take 50 mg by mouth daily.    . methotrexate (RHEUMATREX) 2.5 MG tablet Take 5 mg by mouth once a week. saturday    .  pantoprazole (PROTONIX) 40 MG tablet Take 1 tablet (40 mg total) by mouth 2 (two) times daily before a meal. 60 tablet 5   No current facility-administered medications for this visit.     Physical Exam  Blood pressure 145/96, pulse 90, temperature 100.2 F (37.9 C), height 5\' 7"  (1.702 m), weight 142 lb 8 oz (64.638 kg).  Constitutional: overall normal hygiene, normal nutrition, well developed, normal grooming, normal body habitus. Assistive device:none  Musculoskeletal: gait and station Limp none, muscle tone and strength are normal, no tremors or atrophy is present.  .  Neurological: coordination overall normal.  Deep tendon reflex/nerve stretch intact.  Sensation normal.  Cranial nerves II-XII intact.   Skin:   normal overall no  scars, lesions, ulcers or rashes. No psoriasis.  Psychiatric: Alert and oriented x 3.  Recent memory intact, remote memory unclear.  Normal mood and affect. Well groomed.  Good eye contact.  Cardiovascular: overall no swelling, no varicosities, no edema bilaterally, normal temperatures of the legs and arms, no clubbing, cyanosis and good capillary refill.  Lymphatic: palpation is normal.  Examination of right Upper Extremity is done.  Inspection:   Overall:  Elbow non-tender without crepitus or defects, forearm non-tender without crepitus or defects, wrist non-tender without crepitus or defects, hand non-tender.    Shoulder: with glenohumeral joint tenderness, without effusion.   Upper arm: without swelling and tenderness   Range of motion:   Overall:  Full range of motion of the elbow, full range of motion of wrist and full range of motion in fingers.   Shoulder:  right  165 degrees forward flexion; 140 degrees abduction; 35 degrees internal rotation, 35 degrees external rotation, 20 degrees extension, 40 degrees adduction.   Stability:   Overall:  Shoulder, elbow and wrist stable   Strength and Tone:   Overall full shoulder muscles strength, full upper arm strength and normal upper arm bulk and tone.   The patient has been educated about the nature of the problem(s) and counseled on treatment options.  The patient appeared to understand what I have discussed and is in agreement with it.  Encounter Diagnoses  Name Primary?  . Right shoulder pain Yes  . Chronic obstructive pulmonary disease, unspecified COPD type ([place])     PLAN Call if any problems.  Precautions discussed.  Continue current medications.   Return to clinic after MRI of the right shoulder.   Consider stopping to smoke or cutting back.  Electronically Signed Sanjuana Kava, MD 7/5/20178:31 AM

## 2015-09-08 ENCOUNTER — Ambulatory Visit (HOSPITAL_COMMUNITY)
Admission: RE | Admit: 2015-09-08 | Discharge: 2015-09-08 | Disposition: A | Discharge status: Home / Self Care | Payer: Medicaid Other | Source: Ambulatory Visit | Attending: Orthopaedic Surgery | Admitting: Orthopaedic Surgery

## 2015-09-08 DIAGNOSIS — R937 Abnormal findings on diagnostic imaging of other parts of musculoskeletal system: Secondary | ICD-10-CM | POA: Insufficient documentation

## 2015-09-08 DIAGNOSIS — M25511 Pain in right shoulder: Secondary | ICD-10-CM | POA: Diagnosis present

## 2015-09-09 ENCOUNTER — Encounter: Payer: Self-pay | Admitting: Orthopaedic Surgery

## 2015-09-09 ENCOUNTER — Ambulatory Visit (INDEPENDENT_AMBULATORY_CARE_PROVIDER_SITE_OTHER): Payer: Medicaid Other | Admitting: Orthopaedic Surgery

## 2015-09-09 VITALS — BP 114/41 | HR 91 | Temp 97.9°F | Ht 68.0 in | Wt 142.0 lb

## 2015-09-09 DIAGNOSIS — M25511 Pain in right shoulder: Secondary | ICD-10-CM | POA: Diagnosis not present

## 2015-09-09 DIAGNOSIS — J449 Chronic obstructive pulmonary disease, unspecified: Secondary | ICD-10-CM

## 2015-09-09 MED ORDER — HYDROCODONE-ACETAMINOPHEN 5-325 MG PO TABS: 1.0000 | ORAL_TABLET | ORAL | Status: DC | PRN

## 2015-09-09 NOTE — Progress Notes (Signed)
Patient Shane Allison TAHMIR ZUMBA, male DOB:1966-01-26, 50 y.o. [id number]  Chief Complaint  Patient presents with  . Follow-up    RIGHT SHOULDER MRI REVIEW    HPI  Shane Allison is a 50 y.o. male who has chronic right shoulder pain.  He had a MRI of the right shoulder.  It shows:  IMPRESSION: 1. Moderate rotator cuff tendinopathy/tendinosis. No partial or full-thickness tear. 2. Intact long head biceps tendon and grossly normal glenoid labrum. 3. Moderate lateral downsloping of the acromion could contribute to bony impingement. 4. Mild glenohumeral joint degenerative changes. There is moderate thickening of the capsular structures in the axillary recess which can be seen with adhesive capsulitis or synovitis.  He was explained the findings of the MRI.  I will begin OT for him. HPI  Body mass index is 21.6 kg/(m^2).  ROS  Review of Systems  HENT: Negative for congestion.   Respiratory: Positive for cough and shortness of breath.   Cardiovascular: Negative for chest pain and leg swelling.  Endocrine: Negative for cold intolerance.  Musculoskeletal: Positive for myalgias and arthralgias.  Allergic/Immunologic: Positive for environmental allergies.    Past Medical History  Diagnosis Date  . Hypertension   . Psoriasis   . COPD (chronic obstructive pulmonary disease) ([place])   . Chronic neck pain   . Cervical radiculopathy   . Alcohol use ([place])   . Asthma   . Shortness of breath dyspnea     Past Surgical History  Procedure Laterality Date  . Hand tendon surgery    . Thumb amputation      Partial left thumb   . Esophagogastroduodenoscopy (egd) with propofol N/A 05/12/2015    Procedure: ESOPHAGOGASTRODUODENOSCOPY (EGD) WITH PROPOFOL;  Surgeon: Danie Binder, MD;  [facility];  Service: Endoscopy;  Laterality: N/A;  . Biopsy  05/12/2015    Procedure: BIOPSY;  Surgeon: Danie Binder, MD;  [facility];  Service: Endoscopy;;  gastric biopsy     Family History  Problem Relation Age of Onset  . Colon cancer Neg Hx     Social History Social History  Substance Use Topics  . Smoking status: Current Every Day Smoker -- 3.00 packs/day    Types: Cigarettes  . Smokeless tobacco: None     Comment: 3 packs a day normally, but states "lately I'm down to 1" as of March 2017   . Alcohol Use: 0.0 oz/week    0 Standard drinks or equivalent per week     Comment: 6-12 pack beer at least 5 days a week     Allergies  Allergen Reactions  . Penicillins Rash    Has patient had a PCN reaction causing immediate rash, facial/tongue/throat swelling, SOB or lightheadedness with hypotension: Yes Has patient had a PCN reaction causing severe rash involving mucus membranes or skin necrosis: No Has patient had a PCN reaction that required hospitalization No Has patient had a PCN reaction occurring within the last 10 years: No If all of the above answers are "NO", then may proceed with Cephalosporin use.      Current Outpatient Prescriptions  Medication Sig Dispense Refill  . folic acid (FOLVITE) 1 MG tablet Take 1 mg by mouth daily.    Marland Kitchen lisinopril (PRINIVIL,ZESTRIL) 10 MG tablet Take 10 mg by mouth daily.    Marland Kitchen losartan (COZAAR) 50 MG tablet Take 50 mg by mouth daily.    . methotrexate (RHEUMATREX) 2.5 MG tablet Take 5 mg by mouth once a week. saturday    .  pantoprazole (PROTONIX) 40 MG tablet Take 1 tablet (40 mg total) by mouth 2 (two) times daily before a meal. 60 tablet 5  . HYDROcodone-acetaminophen (NORCO/VICODIN) 5-325 MG tablet Take 1 tablet by mouth every 4 (four) hours as needed for moderate pain (Must last 30 days.  Do not take and drive a car or use machinery.). 120 tablet 0   No current facility-administered medications for this visit.     Physical Exam  Blood pressure 114/41, pulse 91, temperature 97.9 F (36.6 C), height 5\' 8"  (1.727 m), weight 142 lb (64.411 kg).  Constitutional: overall normal hygiene, normal  nutrition, well developed, normal grooming, normal body habitus. Assistive device:none  Musculoskeletal: gait and station Limp none, muscle tone and strength are normal, no tremors or atrophy is present.  .  Neurological: coordination overall normal.  Deep tendon reflex/nerve stretch intact.  Sensation normal.  Cranial nerves II-XII intact.   Skin:   normal overall no scars, lesions, ulcers or rashes. No psoriasis.  Psychiatric: Alert and oriented x 3.  Recent memory intact, remote memory unclear.  Normal mood and affect. Well groomed.  Good eye contact.  Cardiovascular: overall no swelling, no varicosities, no edema bilaterally, normal temperatures of the legs and arms, no clubbing, cyanosis and good capillary refill.  Lymphatic: palpation is normal.  Examination of right Upper Extremity is done.  Inspection:   Overall:  Elbow non-tender without crepitus or defects, forearm non-tender without crepitus or defects, wrist non-tender without crepitus or defects, hand non-tender.    Shoulder: with glenohumeral joint tenderness, without effusion.   Upper arm: without swelling and tenderness   Range of motion:   Overall:  Full range of motion of the elbow, full range of motion of wrist and full range of motion in fingers.   Shoulder:  right  165 degrees forward flexion; 145 degrees abduction; 35 degrees internal rotation, 35 degrees external rotation, 20 degrees extension, 40 degrees adduction.   Stability:   Overall:  Shoulder, elbow and wrist stable   Strength and Tone:   Overall full shoulder muscles strength, full upper arm strength and normal upper arm bulk and tone.   The patient has been educated about the nature of the problem(s) and counseled on treatment options.  The patient appeared to understand what I have discussed and is in agreement with it.  Encounter Diagnoses  Name Primary?  . Right shoulder pain Yes  . Chronic obstructive pulmonary disease, unspecified COPD type  ([place])     PLAN Call if any problems.  Precautions discussed.  Continue current medications.   Return to clinic 1 month   Electronically Signed Sanjuana Kava, MD 7/19/201710:57 AM

## 2015-09-10 ENCOUNTER — Ambulatory Visit: Payer: Medicaid Other | Admitting: Orthopaedic Surgery

## 2015-09-11 ENCOUNTER — Ambulatory Visit (HOSPITAL_COMMUNITY): Payer: Medicaid Other | Attending: Orthopaedic Surgery

## 2015-09-23 ENCOUNTER — Ambulatory Visit (HOSPITAL_COMMUNITY): Payer: Medicaid Other | Attending: Orthopaedic Surgery | Admitting: Specialist

## 2015-09-23 DIAGNOSIS — M25511 Pain in right shoulder: Secondary | ICD-10-CM | POA: Diagnosis not present

## 2015-09-23 NOTE — Therapy (Signed)
[place], [place], [address] Phone: [phone]   Fax:  [phone]  Occupational Therapy Evaluation  Patient Details  Name: Shane Allison MRN: [id number] Date of Birth: Jan 11, 1966 Referring Provider: Dr. Sanjuana Kava  Encounter Date: 09/23/2015      OT End of Session - 09/23/15 1540    Visit Number 1   Number of Visits 1   Date for OT Re-Evaluation 09/30/15   Authorization Type Medicaid - one visit authorized   Authorization - Visit Number 1   Authorization - Number of Visits 1   OT Start Time [id number]   OT Stop Time 1500   OT Time Calculation (min) 40 min   Activity Tolerance Patient tolerated treatment well   Behavior During Therapy [facility] for tasks assessed/performed      Past Medical History:  Diagnosis Date  . Alcohol use ([place])   . Asthma   . Cervical radiculopathy   . Chronic neck pain   . COPD (chronic obstructive pulmonary disease) ([place])   . Hypertension   . Psoriasis   . Shortness of breath dyspnea     Past Surgical History:  Procedure Laterality Date  . BIOPSY  05/12/2015   Procedure: BIOPSY;  Surgeon: Danie Binder, MD;  [facility];  Service: Endoscopy;;  gastric biopsy  . ESOPHAGOGASTRODUODENOSCOPY (EGD) WITH PROPOFOL N/A 05/12/2015   Procedure: ESOPHAGOGASTRODUODENOSCOPY (EGD) WITH PROPOFOL;  Surgeon: Danie Binder, MD;  [facility];  Service: Endoscopy;  Laterality: N/A;  . HAND TENDON SURGERY    . THUMB AMPUTATION     Partial left thumb     There were no vitals filed for this visit.      Subjective Assessment - 09/23/15 1527    Subjective  S:  I have had pain for 4-5 years.  It mostly hurts at night.   Pertinent History Shane Allison has been experiencing pain in his right shoulder for 4-5 years.  He consulted with Dr. Luna Glasgow, had an MRI, and has been referred to occupational therapy for evaluation and treatement.  He experiences most pain in his anterior  and lateral shoulder when lying on his side and when pushing his daughters stroller.     Patient Stated Goals I want to get rid of the pain.    Currently in Pain? Yes   Pain Score 10-Worst pain ever   Pain Location Shoulder   Pain Orientation Right;Anterior   Pain Descriptors / Indicators Aching   Pain Type Chronic pain   Pain Radiating Towards neck   Pain Onset Other (comment)  4-5 years   Pain Frequency Intermittent   Aggravating Factors  lying on his right side   Pain Relieving Factors changing positions   Effect of Pain on Daily Activities difficult to push his daughter's stroller           [facility] OT Assessment - 09/23/15 0001      Assessment   Diagnosis Right Shoulder Pain   Referring Provider Dr. Sanjuana Kava   Onset Date --  4-5 years ago     Precautions   Precautions None     Restrictions   Weight Bearing Restrictions No     Balance Screen   Has the patient fallen in the past 6 months No     Home  Environment   Family/patient expects to be discharged to: Private residence   Lives With Family     Prior Function   Level  of Independence Independent   Vocation Unemployed   Leisure enjoys Dealer work     ADL   ADL comments Patient has a 55 year old daughter and is assisted by his sister and mother in caring for her.  He has increased pain when sleeping on his right side and when pushing his daughters stroller     Written Expression   Dominant Hand Right     Vision - History   Baseline Vision No visual deficits     Cognition   Overall Cognitive Status Within Functional Limits for tasks assessed     Sensation   Light Touch Appears Intact     Coordination   Gross Motor Movements are Fluid and Coordinated Yes   Fine Motor Movements are Fluid and Coordinated Yes     ROM / Strength   AROM / PROM / Strength AROM;Strength     Palpation   Palpation comment mod-max fascial restrictions in right shoulder and scapular region     AROM   Overall AROM  Comments assessed in seated, external and internal rotation with shoulder abducted to 90   AROM Assessment Site Shoulder   Right/Left Shoulder Right   Right Shoulder Flexion 145 Degrees   Right Shoulder ABduction 170 Degrees   Right Shoulder Internal Rotation 65 Degrees   Right Shoulder External Rotation 90 Degrees     Strength   Strength Assessment Site Shoulder   Right/Left Shoulder Right   Right Shoulder Flexion 5/5   Right Shoulder Extension 5/5   Right Shoulder ABduction 5/5   Right Shoulder Internal Rotation 5/5   Right Shoulder External Rotation 5/5                  OT Treatments/Exercises (OP) - 09/23/15 0001      Manual Therapy   Manual Therapy Myofascial release   Manual therapy comments manual therapy completed seperately from all other interventions this date   Myofascial Release myofascial release and manual stretching to right upper arm, scapular, shoulder and associated areas to decrease pain and restrictions and improve pain free mobility in his right shoulder region.                OT Education - 09/23/15 1539    Education provided Yes   Education Details Educated patient on self massage to muscle knots with tennis ball, scapular stability/strengthening with green mod resist theraband, and shoulder stretches   Person(s) Educated Patient   Methods Explanation;Demonstration;Handout   Comprehension Verbalized understanding;Returned demonstration          OT Short Term Goals - 09/23/15 1545      OT SHORT TERM GOAL #1   Title Patient will be educated and independent with a HEP for right shoulder stretches, scapular stability, and self trigger point release to decrease pain and improve pain free mobility needed to sleep comfortably at night.    Time 1   Period Days   Status Achieved                  Plan - 09/23/15 1540    Clinical Impression Statement A:  Patient is a 50 year old male with past medical history significant for  COPD, Hypertension, chronic neck pain, alcohol abuse, and partial thumb amputation.  He has been experiencing increased pain in his right shoulder for several years.  Most pain occurs at night and when pushing his daughter's stroller.     Rehab Potential Good   OT Frequency 1x / week  OT Duration --  1 week   OT Treatment/Interventions Self-care/ADL training;Therapeutic exercises;Manual Therapy   Plan P:  Due to insurance limitations, patient was seen for a one time evaluation this date.  Patient was educated on HEP for self trigger point release, scapular strengthening and shoulder stretches.  Encouraged patient to contact this clinic if he has any issues with his HEP.  DC from skilled OT services this date.   Consulted and Agree with Plan of Care Patient      Patient will benefit from skilled therapeutic intervention in order to improve the following deficits and impairments:  Decreased range of motion, Increased fascial restricitons, Increased muscle spasms, Pain  Visit Diagnosis: Pain in right shoulder - Plan: Ot plan of care cert/re-cert    Problem List Patient Active Problem List   Diagnosis Date Noted  . GI bleed 05/10/2015  . Syncope and collapse 05/10/2015  . RUQ abdominal pain 05/10/2015  . Alcohol abuse 05/10/2015  . COPD (chronic obstructive pulmonary disease) ([place]) 05/10/2015  . Acute pancreatitis 05/10/2015  . AKI (acute kidney injury) ([place]) 05/10/2015  . Hyponatremia 05/10/2015  . Forehead abrasion   . Radicular pain in right arm 05/14/2013  . Cervical spondylosis without myelopathy 05/14/2013  . Numbness and tingling in hands 05/14/2013  . Contusion shoulder/arm 04/02/2013  . Partial tear of rotator cuff 04/02/2013    Vangie Bicker, [place], OTR/L [phone]  09/23/2015, 3:48 PM  [facility] [address] [place], [place], [address] Phone: [phone]   Fax:  [phone]  Name: Shane Allison MRN:  [id number] Date of Birth: 11-Apr-1965

## 2015-09-23 NOTE — Patient Instructions (Signed)
  Place a ball behind your shoulder blade while lying on the floor. Experiment with moving your shoulder in every possible direction for 3 minutes on each side. Your arm will look like seaweed floating in the water. (Home) Extension: Isometric / Bilateral Arm Retraction - Sitting   Facing anchor, hold hands and elbow at shoulder height, with elbow bent.  Pull arms back to squeeze shoulder blades together. Repeat 10-15 times.  Copyright  VHI. All rights reserved.   (Home) Retraction: Row - Bilateral (Anchor)   Facing anchor, arms reaching forward, pull hands toward stomach, keeping elbows bent and at your sides and pinching shoulder blades together. Repeat 10-15 times.  Copyright  VHI. All rights reserved.   (Clinic) Extension / Flexion (Assist)   Face anchor, pull arms back, keeping elbow straight, and squeze shoulder blades together. Repeat 10-15 times.   Copyright  VHI. All rights reserved.  Doorway Stretch  Place each hand opposite each other on the doorway. (You can change where you feel the stretch by moving arms higher or lower.) Step through with one foot and bend front knee until a stretch is felt and hold. Step through with the opposite foot on the next rep. Hold for _____ seconds. Repeat ____times.     Scapular Retraction (Standing)   With arms at sides, pinch shoulder blades together. Repeat ____ times per set. Do ____ sets per session. Do ____ sessions per day.  http://orth.exer.us/944   Copyright  VHI. All rights reserved.   Internal Rotation Across Back  Grab the end of a towel with your affected side, palm facing backwards. Grab the towel with your unaffected side and pull your affected hand across your back until you feel a stretch in the front of your shoulder. If you feel pain, pull just to the pain, do not pull through the pain. Hold. Return your affected arm to your side. Try to keep your hand/arm close to your body during the entire movement.             Posterior Capsule Stretch   Stand or sit, one arm across body so hand rests over opposite shoulder. Gently push on crossed elbow with other hand until stretch is felt in shoulder of crossed arm. Hold ___ seconds.  Repeat ___ times per session. Do ___ sessions per day.   Wall Flexion  Using a towel, slide your arm up the wall until a stretch is felt in your shoulder .

## 2015-10-07 ENCOUNTER — Encounter: Payer: Self-pay | Admitting: Orthopaedic Surgery

## 2015-10-07 ENCOUNTER — Ambulatory Visit (INDEPENDENT_AMBULATORY_CARE_PROVIDER_SITE_OTHER): Payer: Medicaid Other | Admitting: Orthopaedic Surgery

## 2015-10-07 VITALS — BP 127/88 | HR 73 | Temp 98.8°F | Ht 68.0 in | Wt 143.0 lb

## 2015-10-07 DIAGNOSIS — J449 Chronic obstructive pulmonary disease, unspecified: Secondary | ICD-10-CM | POA: Diagnosis not present

## 2015-10-07 DIAGNOSIS — M25511 Pain in right shoulder: Secondary | ICD-10-CM

## 2015-10-07 DIAGNOSIS — Z720 Tobacco use: Secondary | ICD-10-CM

## 2015-10-07 DIAGNOSIS — F17200 Nicotine dependence, unspecified, uncomplicated: Secondary | ICD-10-CM

## 2015-10-07 MED ORDER — HYDROCODONE-ACETAMINOPHEN 5-325 MG PO TABS: 1.0000 | ORAL_TABLET | ORAL | 0 refills | Status: DC | PRN

## 2015-10-07 NOTE — Progress Notes (Signed)
Patient Shane Allison, male DOB:10-08-1965, 50 y.o. [id number]  Chief Complaint  Patient presents with  . Follow-up    right shoulder pain    HPI  Shane Allison is a 50 y.o. male his right shoulder is better. He went to OT for one visit.  He is doing his exercises.  He still has some pain but is improved.  He has no paresthesias or new trauma. HPI  Body mass index is 21.74 kg/m.  ROS  Review of Systems  HENT: Negative for congestion.   Respiratory: Positive for cough and shortness of breath.   Cardiovascular: Negative for chest pain and leg swelling.  Endocrine: Negative for cold intolerance.  Musculoskeletal: Positive for arthralgias and myalgias.  Allergic/Immunologic: Positive for environmental allergies.    Past Medical History:  Diagnosis Date  . Alcohol use ([place])   . Asthma   . Cervical radiculopathy   . Chronic neck pain   . COPD (chronic obstructive pulmonary disease) ([place])   . Hypertension   . Psoriasis   . Shortness of breath dyspnea     Past Surgical History:  Procedure Laterality Date  . BIOPSY  05/12/2015   Procedure: BIOPSY;  Surgeon: Danie Binder, MD;  [facility];  Service: Endoscopy;;  gastric biopsy  . ESOPHAGOGASTRODUODENOSCOPY (EGD) WITH PROPOFOL N/A 05/12/2015   Procedure: ESOPHAGOGASTRODUODENOSCOPY (EGD) WITH PROPOFOL;  Surgeon: Danie Binder, MD;  [facility];  Service: Endoscopy;  Laterality: N/A;  . HAND TENDON SURGERY    . THUMB AMPUTATION     Partial left thumb     Family History  Problem Relation Age of Onset  . Colon cancer Neg Hx     Social History Social History  Substance Use Topics  . Smoking status: Current Every Day Smoker    Packs/day: 3.00    Types: Cigarettes  . Smokeless tobacco: Not on file     Comment: 3 packs a day normally, but states "lately I'm down to 1" as of March 2017   . Alcohol use 0.0 oz/week     Comment: 6-12 pack beer at least 5 days a week     Allergies   Allergen Reactions  . Penicillins Rash    Has patient had a PCN reaction causing immediate rash, facial/tongue/throat swelling, SOB or lightheadedness with hypotension: Yes Has patient had a PCN reaction causing severe rash involving mucus membranes or skin necrosis: No Has patient had a PCN reaction that required hospitalization No Has patient had a PCN reaction occurring within the last 10 years: No If all of the above answers are "NO", then may proceed with Cephalosporin use.      Current Outpatient Prescriptions  Medication Sig Dispense Refill  . folic acid (FOLVITE) 1 MG tablet Take 1 mg by mouth daily.    Marland Kitchen HYDROcodone-acetaminophen (NORCO/VICODIN) 5-325 MG tablet Take 1 tablet by mouth every 4 (four) hours as needed for moderate pain (Must last 30 days.  Do not take and drive a car or use machinery.). 120 tablet 0  . Ixekizumab (TALTZ [place]) Inject into the skin.    Marland Kitchen lisinopril (PRINIVIL,ZESTRIL) 10 MG tablet Take 10 mg by mouth daily.    Marland Kitchen losartan (COZAAR) 50 MG tablet Take 50 mg by mouth daily.    . methotrexate (RHEUMATREX) 2.5 MG tablet Take 5 mg by mouth once a week. saturday    . pantoprazole (PROTONIX) 40 MG tablet Take 1 tablet (40 mg total) by mouth 2 (two) times daily  before a meal. 60 tablet 5   No current facility-administered medications for this visit.      Physical Exam  Blood pressure 127/88, pulse 73, temperature 98.8 F (37.1 C), height 5\' 8"  (1.727 m), weight 143 lb (64.9 kg).  Constitutional: overall normal hygiene, normal nutrition, well developed, normal grooming, normal body habitus. Assistive device:none  Musculoskeletal: gait and station Limp none, muscle tone and strength are normal, no tremors or atrophy is present.  .  Neurological: coordination overall normal.  Deep tendon reflex/nerve stretch intact.  Sensation normal.  Cranial nerves II-XII intact.   Skin:   normal overall no scars, lesions, ulcers or rashes. No psoriasis.  Psychiatric:  Alert and oriented x 3.  Recent memory intact, remote memory unclear.  Normal mood and affect. Well groomed.  Good eye contact.  Cardiovascular: overall no swelling, no varicosities, no edema bilaterally, normal temperatures of the legs and arms, no clubbing, cyanosis and good capillary refill.  Lymphatic: palpation is normal.  Examination of right Upper Extremity is done.  Inspection:   Overall:  Elbow non-tender without crepitus or defects, forearm non-tender without crepitus or defects, wrist non-tender without crepitus or defects, hand non-tender.    Shoulder: with glenohumeral joint tenderness, without effusion.   Upper arm: without swelling and tenderness   Range of motion:   Overall:  Full range of motion of the elbow, full range of motion of wrist and full range of motion in fingers.   Shoulder:  right  full degrees forward flexion; full degrees abduction; full degrees internal rotation, full degrees external rotation, full degrees extension, full degrees adduction.   Stability:   Overall:  Shoulder, elbow and wrist stable   Strength and Tone:   Overall full shoulder muscles strength, full upper arm strength and normal upper arm bulk and tone.   The patient has been educated about the nature of the problem(s) and counseled on treatment options.  The patient appeared to understand what I have discussed and is in agreement with it.  Encounter Diagnoses  Name Primary?  . Right shoulder pain Yes  . Chronic obstructive pulmonary disease, unspecified COPD type ([place])   . Tobacco smoker within last 12 months     PLAN Call if any problems.  Precautions discussed.  Continue current medications.   Return to clinic 1 month   Electronically Signed Sanjuana Kava, MD 8/16/20179:41 AM

## 2015-10-07 NOTE — Patient Instructions (Signed)
Smoking Cessation, Tips for Success If you are ready to quit smoking, congratulations! You have chosen to help yourself be healthier. Cigarettes bring nicotine, tar, carbon monoxide, and other irritants into your body. Your lungs, heart, and blood vessels will be able to work better without these poisons. There are many different ways to quit smoking. Nicotine gum, nicotine patches, a nicotine inhaler, or nicotine nasal spray can help with physical craving. Hypnosis, support groups, and medicines help break the habit of smoking. WHAT THINGS CAN I DO TO MAKE QUITTING EASIER?  Here are some tips to help you quit for good:  Pick a date when you will quit smoking completely. Tell all of your friends and family about your plan to quit on that date.  Do not try to slowly cut down on the number of cigarettes you are smoking. Pick a quit date and quit smoking completely starting on that day.  Throw away all cigarettes.   Clean and remove all ashtrays from your home, work, and car.  On a card, write down your reasons for quitting. Carry the card with you and read it when you get the urge to smoke.  Cleanse your body of nicotine. Drink enough water and fluids to keep your urine clear or pale yellow. Do this after quitting to flush the nicotine from your body.  Learn to predict your moods. Do not let a bad situation be your excuse to have a cigarette. Some situations in your life might tempt you into wanting a cigarette.  Never have "just one" cigarette. It leads to wanting another and another. Remind yourself of your decision to quit.  Change habits associated with smoking. If you smoked while driving or when feeling stressed, try other activities to replace smoking. Stand up when drinking your coffee. Brush your teeth after eating. Sit in a different chair when you read the paper. Avoid alcohol while trying to quit, and try to drink fewer caffeinated beverages. Alcohol and caffeine may urge you to  smoke.  Avoid foods and drinks that can trigger a desire to smoke, such as sugary or spicy foods and alcohol.  Ask people who smoke not to smoke around you.  Have something planned to do right after eating or having a cup of coffee. For example, plan to take a walk or exercise.  Try a relaxation exercise to calm you down and decrease your stress. Remember, you may be tense and nervous for the first 2 weeks after you quit, but this will pass.  Find new activities to keep your hands busy. Play with a pen, coin, or rubber band. Doodle or draw things on paper.  Brush your teeth right after eating. This will help cut down on the craving for the taste of tobacco after meals. You can also try mouthwash.   Use oral substitutes in place of cigarettes. Try using lemon drops, carrots, cinnamon sticks, or chewing gum. Keep them handy so they are available when you have the urge to smoke.  When you have the urge to smoke, try deep breathing.  Designate your home as a nonsmoking area.  If you are a heavy smoker, ask your health care provider about a prescription for nicotine chewing gum. It can ease your withdrawal from nicotine.  Reward yourself. Set aside the cigarette money you save and buy yourself something nice.  Look for support from others. Join a support group or smoking cessation program. Ask someone at home or at work to help you with your plan   to quit smoking.  Always ask yourself, "Do I need this cigarette or is this just a reflex?" Tell yourself, "Today, I choose not to smoke," or "I do not want to smoke." You are reminding yourself of your decision to quit.  Do not replace cigarette smoking with electronic cigarettes (commonly called e-cigarettes). The safety of e-cigarettes is unknown, and some may contain harmful chemicals.  If you relapse, do not give up! Plan ahead and think about what you will do the next time you get the urge to smoke. HOW WILL I FEEL WHEN I QUIT SMOKING? You  may have symptoms of withdrawal because your body is used to nicotine (the addictive substance in cigarettes). You may crave cigarettes, be irritable, feel very hungry, cough often, get headaches, or have difficulty concentrating. The withdrawal symptoms are only temporary. They are strongest when you first quit but will go away within 10-14 days. When withdrawal symptoms occur, stay in control. Think about your reasons for quitting. Remind yourself that these are signs that your body is healing and getting used to being without cigarettes. Remember that withdrawal symptoms are easier to treat than the major diseases that smoking can cause.  Even after the withdrawal is over, expect periodic urges to smoke. However, these cravings are generally short lived and will go away whether you smoke or not. Do not smoke! WHAT RESOURCES ARE AVAILABLE TO HELP ME QUIT SMOKING? Your health care provider can direct you to community resources or hospitals for support, which may include:  Group support.  Education.  Hypnosis.  Therapy.   This information is not intended to replace advice given to you by your health care provider. Make sure you discuss any questions you have with your health care provider.   Document Released: 11/06/2003 Document Revised: 02/28/2014 Document Reviewed: 07/26/2012 Elsevier Interactive Patient Education 2016 [employer]  

## 2015-10-21 ENCOUNTER — Encounter: Payer: Self-pay | Admitting: Gastroenterology

## 2015-11-04 ENCOUNTER — Ambulatory Visit (INDEPENDENT_AMBULATORY_CARE_PROVIDER_SITE_OTHER): Payer: Medicaid Other | Admitting: Orthopaedic Surgery

## 2015-11-04 ENCOUNTER — Encounter: Payer: Self-pay | Admitting: Orthopaedic Surgery

## 2015-11-04 ENCOUNTER — Ambulatory Visit (INDEPENDENT_AMBULATORY_CARE_PROVIDER_SITE_OTHER): Payer: Medicaid Other

## 2015-11-04 VITALS — BP 143/96 | HR 76 | Temp 98.1°F | Ht 68.0 in | Wt 147.0 lb

## 2015-11-04 DIAGNOSIS — M25511 Pain in right shoulder: Secondary | ICD-10-CM

## 2015-11-04 DIAGNOSIS — M898X1 Other specified disorders of bone, shoulder: Secondary | ICD-10-CM | POA: Diagnosis not present

## 2015-11-04 MED ORDER — HYDROCODONE-ACETAMINOPHEN 5-325 MG PO TABS: 1.0000 | ORAL_TABLET | Freq: Four times a day (QID) | ORAL | 0 refills | Status: DC | PRN

## 2015-11-04 NOTE — Addendum Note (Signed)
Addended by: Tressa Busman L on: 11/04/2015 11:31 AM   Modules accepted: Orders

## 2015-11-04 NOTE — Progress Notes (Signed)
Patient [id number] Shane Allison, male DOB:08-20-1965, 50 y.o. [id number]  Chief Complaint  Patient presents with  . Shoulder Problem    right shoulder and new problem with the left shoulder    HPI  Shane Allison is a 50 y.o. male who has had pain in the right shoulder.  It is much improved.  He has pain now of the left scapular area more at the apex of the scapula.  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  He has tried ice and heat and Advil with slight help.  He has no paresthesias.  He has no swelling or redness. HPI  Body mass index is 22.35 kg/m.  ROS  Review of Systems  HENT: Negative for congestion.   Respiratory: Positive for cough and shortness of breath.   Cardiovascular: Negative for chest pain and leg swelling.  Endocrine: Negative for cold intolerance.  Musculoskeletal: Positive for arthralgias and myalgias.  Allergic/Immunologic: Positive for environmental allergies.    Past Medical History:  Diagnosis Date  . Alcohol use ([place])   . Asthma   . Cervical radiculopathy   . Chronic neck pain   . COPD (chronic obstructive pulmonary disease) ([place])   . Hypertension   . Psoriasis   . Shortness of breath dyspnea     Past Surgical History:  Procedure Laterality Date  . BIOPSY  05/12/2015   Procedure: BIOPSY;  Surgeon: Danie Binder, MD;  [facility];  Service: Endoscopy;;  gastric biopsy  . ESOPHAGOGASTRODUODENOSCOPY (EGD) WITH PROPOFOL N/A 05/12/2015   Procedure: ESOPHAGOGASTRODUODENOSCOPY (EGD) WITH PROPOFOL;  Surgeon: Danie Binder, MD;  [facility];  Service: Endoscopy;  Laterality: N/A;  . HAND TENDON SURGERY    . THUMB AMPUTATION     Partial left thumb     Family History  Problem Relation Age of Onset  . Colon cancer Neg Hx     Social History Social History  Substance Use Topics  . Smoking status: Current Every Day Smoker    Packs/day: 3.00    Types: Cigarettes  . Smokeless tobacco: Not on file     Comment: 3  packs a day normally, but states "lately I'm down to 1" as of March 2017   . Alcohol use 0.0 oz/week     Comment: 6-12 pack beer at least 5 days a week     Allergies  Allergen Reactions  . Penicillins Rash    Has patient had a PCN reaction causing immediate rash, facial/tongue/throat swelling, SOB or lightheadedness with hypotension: Yes Has patient had a PCN reaction causing severe rash involving mucus membranes or skin necrosis: No Has patient had a PCN reaction that required hospitalization No Has patient had a PCN reaction occurring within the last 10 years: No If all of the above answers are "NO", then may proceed with Cephalosporin use.      Current Outpatient Prescriptions  Medication Sig Dispense Refill  . folic acid (FOLVITE) 1 MG tablet Take 1 mg by mouth daily.    Marland Kitchen HYDROcodone-acetaminophen (NORCO/VICODIN) 5-325 MG tablet Take 1 tablet by mouth every 6 (six) hours as needed for moderate pain (Must last 14 days.Do not take and drive a car or use machinery.). 56 tablet 0  . Ixekizumab (TALTZ [place]) Inject into the skin.    Marland Kitchen lisinopril (PRINIVIL,ZESTRIL) 10 MG tablet Take 10 mg by mouth daily.    Marland Kitchen losartan (COZAAR) 50 MG tablet Take 50 mg by mouth daily.    Marland Kitchen  methotrexate (RHEUMATREX) 2.5 MG tablet Take 5 mg by mouth once a week. saturday    . pantoprazole (PROTONIX) 40 MG tablet Take 1 tablet (40 mg total) by mouth 2 (two) times daily before a meal. 60 tablet 5   No current facility-administered medications for this visit.      Physical Exam  Blood pressure (!) 143/96, pulse 76, temperature 98.1 F (36.7 C), height 5\' 8"  (1.727 m), weight 147 lb (66.7 kg).  Constitutional: overall normal hygiene, normal nutrition, well developed, normal grooming, normal body habitus. Assistive device:none  Musculoskeletal: gait and station Limp none, muscle tone and strength are normal, no tremors or atrophy is present.  .  Neurological: coordination overall normal.  Deep tendon  reflex/nerve stretch intact.  Sensation normal.  Cranial nerves II-XII intact.   Skin:   Normal overall no scars, lesions, ulcers or rashes. No psoriasis.  Psychiatric: Alert and oriented x 3.  Recent memory intact, remote memory unclear.  Normal mood and affect. Well groomed.  Good eye contact.  Cardiovascular: overall no swelling, no varicosities, no edema bilaterally, normal temperatures of the legs and arms, no clubbing, cyanosis and good capillary refill.  Lymphatic: palpation is normal.  The left shoulder has full motion but is very tender over the base (apex) of the left scapula with pain.  He has no crepitus.  NV is intact.  X-rays of the left scapula were done, reported separately.  The patient has been educated about the nature of the problem(s) and counseled on treatment options.  The patient appeared to understand what I have discussed and is in agreement with it.  Encounter Diagnoses  Name Primary?  . Pain of left scapula   . Right shoulder pain Yes    PLAN Call if any problems.  Precautions discussed.  Continue current medications.  He is to use Aspercreme to the left shoulder tid to qid.  Return to clinic 1 month   Electronically Signed Sanjuana Kava, MD 9/13/201710:04 AM

## 2015-12-02 ENCOUNTER — Ambulatory Visit (INDEPENDENT_AMBULATORY_CARE_PROVIDER_SITE_OTHER): Payer: Medicaid Other | Admitting: Orthopaedic Surgery

## 2015-12-02 ENCOUNTER — Encounter: Payer: Self-pay | Admitting: Orthopaedic Surgery

## 2015-12-02 VITALS — BP 133/90 | HR 86 | Resp 18 | Ht 68.0 in | Wt 142.0 lb

## 2015-12-02 DIAGNOSIS — M25511 Pain in right shoulder: Secondary | ICD-10-CM | POA: Diagnosis not present

## 2015-12-02 DIAGNOSIS — G8929 Other chronic pain: Secondary | ICD-10-CM | POA: Diagnosis not present

## 2015-12-02 DIAGNOSIS — F17210 Nicotine dependence, cigarettes, uncomplicated: Secondary | ICD-10-CM

## 2015-12-02 DIAGNOSIS — J449 Chronic obstructive pulmonary disease, unspecified: Secondary | ICD-10-CM | POA: Diagnosis not present

## 2015-12-02 MED ORDER — HYDROCODONE-ACETAMINOPHEN 5-325 MG PO TABS: 1.0000 | ORAL_TABLET | Freq: Four times a day (QID) | ORAL | 0 refills | Status: DC | PRN

## 2015-12-02 NOTE — Progress Notes (Signed)
Patient Shane Allison Shane Allison, male DOB:1965/12/22, 50 y.o. [id number]  Chief Complaint  Patient presents with  . Follow-up    4 week recheck on right shoulder.    HPI  Shane Allison is a 50 y.o. male who has chronic right shoulder pain.  He has no new trauma.  He is doing his exercises.  He has no paresthesias.  He has pain with overhead use.  He wants to stop smoking.  He is to see Dr. Luan Pulling soon to get patches. HPI  Body mass index is 21.59 kg/m.  ROS  Review of Systems  HENT: Negative for congestion.   Respiratory: Positive for cough and shortness of breath.   Cardiovascular: Negative for chest pain and leg swelling.  Endocrine: Negative for cold intolerance.  Musculoskeletal: Positive for arthralgias and myalgias.  Allergic/Immunologic: Positive for environmental allergies.    Past Medical History:  Diagnosis Date  . Alcohol use   . Asthma   . Cervical radiculopathy   . Chronic neck pain   . COPD (chronic obstructive pulmonary disease) ([place])   . Hypertension   . Psoriasis   . Shortness of breath dyspnea     Past Surgical History:  Procedure Laterality Date  . BIOPSY  05/12/2015   Procedure: BIOPSY;  Surgeon: Danie Binder, MD;  [facility];  Service: Endoscopy;;  gastric biopsy  . ESOPHAGOGASTRODUODENOSCOPY (EGD) WITH PROPOFOL N/A 05/12/2015   Procedure: ESOPHAGOGASTRODUODENOSCOPY (EGD) WITH PROPOFOL;  Surgeon: Danie Binder, MD;  [facility];  Service: Endoscopy;  Laterality: N/A;  . HAND TENDON SURGERY    . THUMB AMPUTATION     Partial left thumb     Family History  Problem Relation Age of Onset  . Colon cancer Neg Hx     Social History Social History  Substance Use Topics  . Smoking status: Current Every Day Smoker    Packs/day: 3.00    Types: Cigarettes  . Smokeless tobacco: Never Used     Comment: 3 packs a day normally, but states "lately I'm down to 1" as of March 2017   . Alcohol use 0.0 oz/week     Comment:  6-12 pack beer at least 5 days a week     Allergies  Allergen Reactions  . Penicillins Rash    Has patient had a PCN reaction causing immediate rash, facial/tongue/throat swelling, SOB or lightheadedness with hypotension: Yes Has patient had a PCN reaction causing severe rash involving mucus membranes or skin necrosis: No Has patient had a PCN reaction that required hospitalization No Has patient had a PCN reaction occurring within the last 10 years: No If all of the above answers are "NO", then may proceed with Cephalosporin use.      Current Outpatient Prescriptions  Medication Sig Dispense Refill  . folic acid (FOLVITE) 1 MG tablet Take 1 mg by mouth daily.    Marland Kitchen HYDROcodone-acetaminophen (NORCO/VICODIN) 5-325 MG tablet Take 1 tablet by mouth every 6 (six) hours as needed for moderate pain (Must last 14 days.Do not take and drive a car or use machinery.). 56 tablet 0  . Ixekizumab (TALTZ [place]) Inject into the skin.    Marland Kitchen lisinopril (PRINIVIL,ZESTRIL) 10 MG tablet Take 10 mg by mouth daily.    Marland Kitchen losartan (COZAAR) 50 MG tablet Take 50 mg by mouth daily.    . methotrexate (RHEUMATREX) 2.5 MG tablet Take 5 mg by mouth once a week. saturday    . pantoprazole (PROTONIX) 40 MG tablet Take  1 tablet (40 mg total) by mouth 2 (two) times daily before a meal. 60 tablet 5   No current facility-administered medications for this visit.      Physical Exam  Blood pressure 133/90, pulse 86, resp. rate 18, height 5\' 8"  (1.727 m), weight 142 lb (64.4 kg).  Constitutional: overall normal hygiene, normal nutrition, well developed, normal grooming, normal body habitus. Assistive device:none  Musculoskeletal: gait and station Limp none, muscle tone and strength are normal, no tremors or atrophy is present.  .  Neurological: coordination overall normal.  Deep tendon reflex/nerve stretch intact.  Sensation normal.  Cranial nerves II-XII intact.   Skin:   Normal overall no scars, lesions, ulcers or  rashes. No psoriasis.  Psychiatric: Alert and oriented x 3.  Recent memory intact, remote memory unclear.  Normal mood and affect. Well groomed.  Good eye contact.  Cardiovascular: overall no swelling, no varicosities, no edema bilaterally, normal temperatures of the legs and arms, no clubbing, cyanosis and good capillary refill.  Lymphatic: palpation is normal.  Examination of right Upper Extremity is done.  Inspection:   Overall:  Elbow non-tender without crepitus or defects, forearm non-tender without crepitus or defects, wrist non-tender without crepitus or defects, hand non-tender.    Shoulder: with glenohumeral joint tenderness, without effusion.   Upper arm: without swelling and tenderness   Range of motion:   Overall:  Full range of motion of the elbow, full range of motion of wrist and full range of motion in fingers.   Shoulder:  right  180 degrees forward flexion; 165 degrees abduction; 40 degrees internal rotation, 40 degrees external rotation, 20 degrees extension, 40 degrees adduction.   Stability:   Overall:  Shoulder, elbow and wrist stable   Strength and Tone:   Overall full shoulder muscles strength, full upper arm strength and normal upper arm bulk and tone.   The patient has been educated about the nature of the problem(s) and counseled on treatment options.  The patient appeared to understand what I have discussed and is in agreement with it.  Encounter Diagnoses  Name Primary?  . Chronic right shoulder pain Yes  . Chronic obstructive pulmonary disease, unspecified COPD type ([place])   . Cigarette nicotine dependence without complication     PLAN Call if any problems.  Precautions discussed.  Continue current medications.   Return to clinic 3 months   Electronically Signed Sanjuana Kava, MD 10/11/20171:49 PM

## 2015-12-04 ENCOUNTER — Encounter: Payer: Self-pay | Admitting: Gastroenterology

## 2015-12-04 ENCOUNTER — Ambulatory Visit (INDEPENDENT_AMBULATORY_CARE_PROVIDER_SITE_OTHER): Payer: Medicaid Other | Admitting: Gastroenterology

## 2015-12-04 VITALS — BP 140/87 | HR 81 | Temp 97.8°F | Ht 67.0 in | Wt 144.2 lb

## 2015-12-04 DIAGNOSIS — D649 Anemia, unspecified: Secondary | ICD-10-CM | POA: Diagnosis not present

## 2015-12-04 LAB — CBC
HCT: 42.3 % (ref 38.5–50.0)
Hemoglobin: 14.3 g/dL (ref 13.2–17.1)
MCH: 32.1 pg (ref 27.0–33.0)
MCHC: 33.8 g/dL (ref 32.0–36.0)
MCV: 94.8 fL (ref 80.0–100.0)
MPV: 9.5 fL (ref 7.5–12.5)
Platelets: 318 10*3/uL (ref 140–400)
RBC: 4.46 MIL/uL (ref 4.20–5.80)
RDW: 13.1 % (ref 11.0–15.0)
WBC: 5.6 10*3/uL (ref 3.8–10.8)

## 2015-12-04 LAB — FERRITIN: Ferritin: 87 ng/mL (ref 20–380)

## 2015-12-04 LAB — IRON AND TIBC
%SAT: 64 % — ABNORMAL HIGH (ref 15–60)
Iron: 197 ug/dL — ABNORMAL HIGH (ref 50–180)
TIBC: 307 ug/dL (ref 250–425)
UIBC: 110 ug/dL — ABNORMAL LOW (ref 125–400)

## 2015-12-04 MED ORDER — PANTOPRAZOLE SODIUM 40 MG PO TBEC: 40.0000 mg | DELAYED_RELEASE_TABLET | Freq: Two times a day (BID) | ORAL | 3 refills | Status: DC

## 2015-12-04 MED ORDER — DICYCLOMINE HCL 10 MG PO CAPS: 10.0000 mg | ORAL_CAPSULE | Freq: Three times a day (TID) | ORAL | 3 refills | Status: DC

## 2015-12-04 NOTE — Assessment & Plan Note (Addendum)
Recent admission for possible melena, anemia, and findings of bleeding, friable duodenal mucosa on EGD. As of note, reports nose bleed day prior to admission. Chronic use of ETOH and NSAIDs noted. At some point he ran out of Protonix and has not been taking this. Persistent RUQ discomfort likely secondary to NSAID, ETOH use, doubt biliary etiology. Will recheck CBC, iron, ferritin, TIBC. Stop NSAIDs and resume Protonix BID.   As of note, he was heme positive during prior hospitalization, in the setting of upper GI bleed. However, if any evidence of IDA/persistent anemia, need to pursue colonoscopy. Notes postprandial fecal urgency alternating soft/loose bowel movements but not persistent loose stools. Will trial Bentyl. Possible colonoscopy to be planned after review of blood work. Discussed risks and benefits with patient at time of appointment.

## 2015-12-04 NOTE — Patient Instructions (Signed)
Please have blood work done today.   Avoid any Ibuprofen and limit/stay away from alcohol.  You may need a colonoscopy. We will see what the blood work shows.  I have sent in a prescription called Bentyl to the pharmacy to take before meals and at bedtime for loose stool and abdominal cramping. Monitor for constipation and dry mouth.   Start taking Protonix twice a day, 30 minutes before breakfast and dinner.   We will be in touch shortly!

## 2015-12-04 NOTE — Progress Notes (Signed)
Referring Provider: Sinda Du, MD Primary Care Physician:  Alonza Bogus, MD  Primary GI: Dr. Oneida Alar   Chief Complaint  Patient presents with  . Abdominal Pain    HPI:   Shane Allison is a 50 y.o. male presenting today with a history of possible melena, anemia, concern for upper GI bleed, admission March 2017 in setting of chronic ETOH use and NSAIDs. EGD noting normal esophagus with bleeding friable duodenal mucosa. Profuse bleeding from nose day prior to seeing black stool.   Notes postprandial urgency. Sometimes formed, sometimes liquid. No rectal bleeding. Heme positive during prior admission. Notes abdominal pain in RUQ that is chronic, comes and goes. Not associated with bowel movements. Only eats about one time a day at supper time. Doesn't want to eat because he doesn't want to have to use the bathroom again. Doesn't want to have to run to the bathroom all the time. No N/V. Drinks a few beers every evening. Every now and then will take Ibuprofen in between hydrocodone. Not taking Protonix. Not sure when he ran out of this.   Past Medical History:  Diagnosis Date  . Alcohol use   . Asthma   . Cervical radiculopathy   . Chronic neck pain   . COPD (chronic obstructive pulmonary disease) ([place])   . Hypertension   . Psoriasis   . Shortness of breath dyspnea     Past Surgical History:  Procedure Laterality Date  . BIOPSY  05/12/2015   Procedure: BIOPSY;  Surgeon: Danie Binder, MD;  Location: [facility];  Service: Endoscopy;;  gastric biopsy  . ESOPHAGOGASTRODUODENOSCOPY (EGD) WITH PROPOFOL N/A 05/12/2015   Dr. Oneida Alar: normal esophagus, gastritis, bleeding friable duodenal mucosa  . HAND TENDON SURGERY    . THUMB AMPUTATION     Partial left thumb     Current Outpatient Prescriptions  Medication Sig Dispense Refill  . folic acid (FOLVITE) 1 MG tablet Take 1 mg by mouth daily.    Marland Kitchen HYDROcodone-acetaminophen (NORCO/VICODIN) 5-325 MG tablet Take 1 tablet by  mouth every 6 (six) hours as needed for moderate pain (Must last 14 days.Do not take and drive a car or use machinery.). 45 tablet 0  . Ixekizumab (TALTZ [place]) Inject into the skin.    Marland Kitchen losartan (COZAAR) 50 MG tablet Take 50 mg by mouth daily.    . methotrexate (RHEUMATREX) 2.5 MG tablet Take 5 mg by mouth once a week. saturday    . dicyclomine (BENTYL) 10 MG capsule Take 1 capsule (10 mg total) by mouth 4 (four) times daily -  before meals and at bedtime. 120 capsule 3  . pantoprazole (PROTONIX) 40 MG tablet Take 1 tablet (40 mg total) by mouth 2 (two) times daily before a meal. 180 tablet 3   No current facility-administered medications for this visit.     Allergies as of 12/04/2015 - Review Complete 12/04/2015  Allergen Reaction Noted  . Penicillins Rash 09/25/2010    Family History  Problem Relation Age of Onset  . Colon cancer Neg Hx     Social History   Social History  . Marital status: Single    Spouse name: N/A  . Number of children: N/A  . Years of education: N/A   Social History Main Topics  . Smoking status: Current Every Day Smoker    Packs/day: 3.00    Types: Cigarettes  . Smokeless tobacco: Never Used     Comment: 3 packs a day normally, but states "lately  I'm down to 1" as of March 2017   . Alcohol use 0.0 oz/week     Comment: 6-12 pack beer at least 5 days a week   . Drug use: No  . Sexual activity: Not Asked   Other Topics Concern  . None   Social History Narrative  . None    Review of Systems: As mentioned in HPI   Physical Exam: BP 140/87   Pulse 81   Temp 97.8 F (36.6 C) (Oral)   Ht 5\' 7"  (1.702 m)   Wt 144 lb 3.2 oz (65.4 kg)   BMI 22.58 kg/m  General:   Alert and oriented. No distress noted. Pleasant and cooperative.  Head:  Normocephalic and atraumatic. Eyes:  Conjuctiva clear without scleral icterus. Mouth:  Oral mucosa pink and moist. Good dentition. No lesions. Neck:  Supple, without mass or thyromegaly. Heart:  S1, S2 present  without murmurs,  Abdomen:  +BS, soft, TTP RLQ and non-distended. No rebound or guarding. No HSM or masses noted. Msk:  Symmetrical without gross deformities. Normal posture. Extremities:  Without edema. Neurologic:  Alert and  oriented x4;  grossly normal neurologically. Psych:  Alert and cooperative. Normal mood and affect.

## 2015-12-07 NOTE — Progress Notes (Signed)
cc'ed to pcp °

## 2015-12-22 ENCOUNTER — Telehealth: Payer: Self-pay | Admitting: Gastroenterology

## 2015-12-22 NOTE — Telephone Encounter (Signed)
[phone]  PATIENT CALLED WANTING TO KNOW RESULTS OF HIS BLOOD WORK

## 2015-12-22 NOTE — Telephone Encounter (Signed)
Forwarding to Anna Boone, NP for results.  

## 2015-12-23 NOTE — Progress Notes (Signed)
LM for pt to call

## 2015-12-23 NOTE — Telephone Encounter (Signed)
Please see result note 

## 2015-12-23 NOTE — Progress Notes (Signed)
Hgb normal, ferritin 87. Iron mildly elevated at 197, iron sats 64, elevated. He drinks alcohol I believe. Is he taking any iron currently? How are his bowel habits since starting Bentyl? Although he was heme positive during admission and likely this was due to upper GI issues, he is only 2 years away from initial screening colonoscopy and having some lower GI issues as well. Would be prudent to go ahead and do a colonoscopy now if having persistent loose stool despite Bentyl. If he is, go ahead and arrange colonoscopy WITH PROPOFOL with Dr. Oneida Alar.

## 2015-12-23 NOTE — Progress Notes (Signed)
PT is aware of results. He goes 2-3 days sometimes now without drinking. His BM's were doing well at first, now everything he eats goes straight through him. He cut back on the Bentyl not taking as often He is not taking any iron.

## 2015-12-23 NOTE — Progress Notes (Signed)
Due to postprandial loose stools: check Cdiff PCR, stool culture, Giardia. With iron sats significantly elevated: check hemochromatosis lab.

## 2015-12-24 ENCOUNTER — Other Ambulatory Visit: Payer: Self-pay

## 2015-12-24 DIAGNOSIS — R790 Abnormal level of blood mineral: Secondary | ICD-10-CM

## 2015-12-24 DIAGNOSIS — R197 Diarrhea, unspecified: Secondary | ICD-10-CM

## 2015-12-24 NOTE — Progress Notes (Signed)
Left message with pt's mom for pt to call. Lab orders entered. Stool containers and all orders at front for pick up.

## 2015-12-24 NOTE — Progress Notes (Signed)
PT is aware.

## 2016-01-04 ENCOUNTER — Telehealth: Payer: Self-pay | Admitting: Orthopaedic Surgery

## 2016-01-04 ENCOUNTER — Other Ambulatory Visit: Payer: Self-pay | Admitting: *Deleted

## 2016-01-04 MED ORDER — HYDROCODONE-ACETAMINOPHEN 5-325 MG PO TABS: 1.0000 | ORAL_TABLET | Freq: Four times a day (QID) | ORAL | 0 refills | Status: DC | PRN

## 2016-01-04 NOTE — Telephone Encounter (Signed)
Hydrocodone-Acetaminophen 5/325mg   Qty 45 Tablets  Take 1 tablet by mouth every 6 (six) hours as needed for moderate pain (Must last 14 days. Do not take and drive a car or use machinery.).

## 2016-02-26 NOTE — Progress Notes (Signed)
How is patient? Still needs stool studies and hemochromatosis labs. Probably just needs OV>

## 2016-02-29 NOTE — Progress Notes (Signed)
Left message with pt's mom that he needs to call our office and schedule an OV.

## 2016-03-01 NOTE — Progress Notes (Signed)
Pt has OV with Leslie Lewis, PA on 12/22/2016 at 10:30 Am.  

## 2016-03-03 ENCOUNTER — Telehealth: Payer: Self-pay

## 2016-03-03 ENCOUNTER — Ambulatory Visit: Payer: Medicaid Other | Admitting: Orthopaedic Surgery

## 2016-03-03 ENCOUNTER — Ambulatory Visit (INDEPENDENT_AMBULATORY_CARE_PROVIDER_SITE_OTHER): Payer: Medicaid Other | Admitting: Gastroenterology

## 2016-03-03 ENCOUNTER — Encounter: Payer: Self-pay | Admitting: Gastroenterology

## 2016-03-03 ENCOUNTER — Other Ambulatory Visit: Payer: Self-pay

## 2016-03-03 VITALS — BP 153/97 | HR 98 | Temp 97.8°F | Ht 67.0 in | Wt 150.2 lb

## 2016-03-03 DIAGNOSIS — F1010 Alcohol abuse, uncomplicated: Secondary | ICD-10-CM | POA: Diagnosis not present

## 2016-03-03 DIAGNOSIS — R197 Diarrhea, unspecified: Secondary | ICD-10-CM

## 2016-03-03 DIAGNOSIS — R790 Abnormal level of blood mineral: Secondary | ICD-10-CM | POA: Insufficient documentation

## 2016-03-03 DIAGNOSIS — R7879 Finding of abnormal level of heavy metals in blood: Secondary | ICD-10-CM

## 2016-03-03 DIAGNOSIS — R1011 Right upper quadrant pain: Secondary | ICD-10-CM

## 2016-03-03 HISTORY — DX: Abnormal level of blood mineral: R79.0

## 2016-03-03 LAB — FERRITIN: Ferritin: 70 ng/mL (ref 20–380)

## 2016-03-03 LAB — HEPATITIS C ANTIBODY: HCV Ab: NEGATIVE

## 2016-03-03 LAB — HEPATIC FUNCTION PANEL
ALT: 22 U/L (ref 9–46)
AST: 25 U/L (ref 10–40)
Albumin: 4.2 g/dL (ref 3.6–5.1)
Alkaline Phosphatase: 72 U/L (ref 40–115)
Bilirubin, Direct: 0.1 mg/dL (ref <=0.2)
Indirect Bilirubin: 0.3 mg/dL (ref 0.2–1.2)
Total Bilirubin: 0.4 mg/dL (ref 0.2–1.2)
Total Protein: 7.3 g/dL (ref 6.1–8.1)

## 2016-03-03 LAB — IRON AND TIBC
%SAT: 28 % (ref 15–60)
Iron: 105 ug/dL (ref 50–180)
TIBC: 378 ug/dL (ref 250–425)
UIBC: 273 ug/dL (ref 125–400)

## 2016-03-03 LAB — HEPATITIS B SURFACE ANTIGEN: Hepatitis B Surface Ag: NEGATIVE

## 2016-03-03 LAB — HEPATITIS B CORE ANTIBODY, TOTAL: Hep B Core Total Ab: NONREACTIVE

## 2016-03-03 MED ORDER — SOD PICOSULFATE-MAG OX-CIT ACD 10-3.5-12 MG-GM-GM PO PACK: 1.0000 | PACK | ORAL | 0 refills | Status: DC

## 2016-03-03 NOTE — Telephone Encounter (Signed)
Tried to call pt to inform of pre-op appt 03/08/16 at 2:15 pm. No answer, no answering machine. Letter mailed.

## 2016-03-03 NOTE — Patient Instructions (Signed)
1. Please have your labs done. 2. Colonoscopy as scheduled. See separate instructions.  3. You need to cut back on your alcohol use, goal of none but definitely no more than 2 beers daily. 4. You should never take more than 4 ibuprofen at a time as this can cause bleeding and kidney failure.

## 2016-03-03 NOTE — Progress Notes (Signed)
CC'ED TO PCP 

## 2016-03-03 NOTE — Patient Instructions (Signed)
Shane Allison  03/03/2016     @PREFPERIOPPHARMACY @   Your procedure is scheduled on  03/15/2016   Report to [facility] at  1100  A.M.  Call this number if you have problems the morning of surgery:  [phone]   Remember:  Do not eat food or drink liquids after midnight.  Take these medicines the morning of surgery with A SIP OF WATER  Bentyl, cozaar, protonix.   Do not wear jewelry, make-up or nail polish.  Do not wear lotions, powders, or perfumes, or deoderant.  Do not shave 48 hours prior to surgery.  Men may shave face and neck.  Do not bring valuables to the hospital.  [facility] is not responsible for any belongings or valuables.  Contacts, dentures or bridgework may not be worn into surgery.  Leave your suitcase in the car.  After surgery it may be brought to your room.  For patients admitted to the hospital, discharge time will be determined by your treatment team.  Patients discharged the day of surgery will not be allowed to drive home.   Name and phone number of your driver:   family Special instructions:  Follow the diet and prep instructions given to you by Dr Nona Dell office.  Please read over the following fact sheets that you were given. Anesthesia Post-op Instructions and Care and Recovery After Surgery          Colonoscopy, Adult A colonoscopy is an exam to look at the entire large intestine. During the exam, a lubricated, bendable tube is inserted into the anus and then passed into the rectum, colon, and other parts of the large intestine. A colonoscopy is often done as a part of normal colorectal screening or in response to certain symptoms, such as anemia, persistent diarrhea, abdominal pain, and blood in the stool. The exam can help screen for and diagnose medical problems, including:  Tumors.  Polyps.  Inflammation.  Areas of bleeding. Tell a health care provider about:  Any allergies you have.  All medicines you are  taking, including vitamins, herbs, eye drops, creams, and over-the-counter medicines.  Any problems you or family members have had with anesthetic medicines.  Any blood disorders you have.  Any surgeries you have had.  Any medical conditions you have.  Any problems you have had passing stool. What are the risks? Generally, this is a safe procedure. However, problems may occur, including:  Bleeding.  A tear in the intestine.  A reaction to medicines given during the exam.  Infection (rare). What happens before the procedure? Eating and drinking restrictions  Follow instructions from your health care provider about eating and drinking, which may include:  A few days before the procedure - follow a low-fiber diet. Avoid nuts, seeds, dried fruit, raw fruits, and vegetables.  1-3 days before the procedure - follow a clear liquid diet. Drink only clear liquids, such as clear broth or bouillon, black coffee or tea, clear juice, clear soft drinks or sports drinks, gelatin desert, and popsicles. Avoid any liquids that contain red or purple dye.  On the day of the procedure - do not eat or drink anything during the 2 hours before the procedure, or within the time period that your health care provider recommends. Bowel prep  If you were prescribed an oral bowel prep to clean out your colon:  Take it as told by your health care provider. Starting the day before your procedure, you  will need to drink a large amount of medicated liquid. The liquid will cause you to have multiple loose stools until your stool is almost clear or light green.  If your skin or anus gets irritated from diarrhea, you may use these to relieve the irritation:  Medicated wipes, such as adult wet wipes with aloe and vitamin E.  A skin soothing-product like petroleum jelly.  If you vomit while drinking the bowel prep, take a break for up to 60 minutes and then begin the bowel prep again. If vomiting continues and you  cannot take the bowel prep without vomiting, call your health care provider. General instructions  Ask your health care provider about changing or stopping your regular medicines. This is especially important if you are taking diabetes medicines or blood thinners.  Plan to have someone take you home from the hospital or clinic. What happens during the procedure?  An IV tube may be inserted into one of your veins.  You will be given medicine to help you relax (sedative).  To reduce your risk of infection:  Your health care team will wash or sanitize their hands.  Your anal area will be washed with soap.  You will be asked to lie on your side with your knees bent.  Your health care provider will lubricate a long, thin, flexible tube. The tube will have a camera and a light on the end.  The tube will be inserted into your anus.  The tube will be gently eased through your rectum and colon.  Air will be delivered into your colon to keep it open. You may feel some pressure or cramping.  The camera will be used to take images during the procedure.  A small tissue sample may be removed from your body to be examined under a microscope (biopsy). If any potential problems are found, the tissue will be sent to a lab for testing.  If small polyps are found, your health care provider may remove them and have them checked for cancer cells.  The tube that was inserted into your anus will be slowly removed. The procedure may vary among health care providers and hospitals. What happens after the procedure?  Your blood pressure, heart rate, breathing rate, and blood oxygen level will be monitored until the medicines you were given have worn off.  Do not drive for 24 hours after the exam.  You may have a small amount of blood in your stool.  You may pass gas and have mild abdominal cramping or bloating due to the air that was used to inflate your colon during the exam.  It is up to you to  get the results of your procedure. Ask your health care provider, or the department performing the procedure, when your results will be ready. This information is not intended to replace advice given to you by your health care provider. Make sure you discuss any questions you have with your health care provider. Document Released: 02/05/2000 Document Revised: 08/28/2015 Document Reviewed: 04/21/2015 Elsevier Interactive Patient Education  2017 [employer]  Colonoscopy, Adult, Care After This sheet gives you information about how to care for yourself after your procedure. Your health care provider may also give you more specific instructions. If you have problems or questions, contact your health care provider. What can I expect after the procedure? After the procedure, it is common to have:  A small amount of blood in your stool for 24 hours after the procedure.  Some gas.  Mild abdominal cramping or bloating. Follow these instructions at home: General instructions  For the first 24 hours after the procedure:  Do not drive or use machinery.  Do not sign important documents.  Do not drink alcohol.  Do your regular daily activities at a slower pace than normal.  Eat soft, easy-to-digest foods.  Rest often.  Take over-the-counter or prescription medicines only as told by your health care provider.  It is up to you to get the results of your procedure. Ask your health care provider, or the department performing the procedure, when your results will be ready. Relieving cramping and bloating  Try walking around when you have cramps or feel bloated.  Apply heat to your abdomen as told by your health care provider. Use a heat source that your health care provider recommends, such as a moist heat pack or a heating pad.  Place a towel between your skin and the heat source.  Leave the heat on for 20-30 minutes.  Remove the heat if your skin turns bright red. This is especially  important if you are unable to feel pain, heat, or cold. You may have a greater risk of getting burned. Eating and drinking  Drink enough fluid to keep your urine clear or pale yellow.  Resume your normal diet as instructed by your health care provider. Avoid heavy or fried foods that are hard to digest.  Avoid drinking alcohol for as long as instructed by your health care provider. Contact a health care provider if:  You have blood in your stool 2-3 days after the procedure. Get help right away if:  You have more than a small spotting of blood in your stool.  You pass large blood clots in your stool.  Your abdomen is swollen.  You have nausea or vomiting.  You have a fever.  You have increasing abdominal pain that is not relieved with medicine. This information is not intended to replace advice given to you by your health care provider. Make sure you discuss any questions you have with your health care provider. Document Released: 09/22/2003 Document Revised: 11/02/2015 Document Reviewed: 04/21/2015 Elsevier Interactive Patient Education  2017 [place] Anesthesia is a term that refers to techniques, procedures, and medicines that help a person stay safe and comfortable during a medical procedure. Monitored anesthesia care, or sedation, is one type of anesthesia. Your anesthesia specialist may recommend sedation if you will be having a procedure that does not require you to be unconscious, such as:  Cataract surgery.  A dental procedure.  A biopsy.  A colonoscopy. During the procedure, you may receive a medicine to help you relax (sedative). There are three levels of sedation:  Mild sedation. At this level, you may feel awake and relaxed. You will be able to follow directions.  Moderate sedation. At this level, you will be sleepy. You may not remember the procedure.  Deep sedation. At this level, you will be asleep. You will not remember  the procedure. The more medicine you are given, the deeper your level of sedation will be. Depending on how you respond to the procedure, the anesthesia specialist may change your level of sedation or the type of anesthesia to fit your needs. An anesthesia specialist will monitor you closely during the procedure. Let your health care provider know about:  Any allergies you have.  All medicines you are taking, including vitamins, herbs, eye drops, creams, and over-the-counter medicines.  Any use of steroids (  by mouth or as a cream).  Any problems you or family members have had with sedatives and anesthetic medicines.  Any blood disorders you have.  Any surgeries you have had.  Any medical conditions you have, such as sleep apnea.  Whether you are pregnant or may be pregnant.  Any use of cigarettes, alcohol, or street drugs. What are the risks? Generally, this is a safe procedure. However, problems may occur, including:  Getting too much medicine (oversedation).  Nausea.  Allergic reaction to medicines.  Trouble breathing. If this happens, a breathing tube may be used to help with breathing. It will be removed when you are awake and breathing on your own.  Heart trouble.  Lung trouble. Before the procedure Staying hydrated  Follow instructions from your health care provider about hydration, which may include:  Up to 2 hours before the procedure - you may continue to drink clear liquids, such as water, clear fruit juice, black coffee, and plain tea. Eating and drinking restrictions  Follow instructions from your health care provider about eating and drinking, which may include:  8 hours before the procedure - stop eating heavy meals or foods such as meat, fried foods, or fatty foods.  6 hours before the procedure - stop eating light meals or foods, such as toast or cereal.  6 hours before the procedure - stop drinking milk or drinks that contain milk.  2 hours before the  procedure - stop drinking clear liquids. Medicines  Ask your health care provider about:  Changing or stopping your regular medicines. This is especially important if you are taking diabetes medicines or blood thinners.  Taking medicines such as aspirin and ibuprofen. These medicines can thin your blood. Do not take these medicines before your procedure if your health care provider instructs you not to. Tests and exams  You will have a physical exam.  You may have blood tests done to show:  How well your kidneys and liver are working.  How well your blood can clot.  General instructions  Plan to have someone take you home from the hospital or clinic.  If you will be going home right after the procedure, plan to have someone with you for 24 hours. What happens during the procedure?  Your blood pressure, heart rate, breathing, level of pain and overall condition will be monitored.  An IV tube will be inserted into one of your veins.  Your anesthesia specialist will give you medicines as needed to keep you comfortable during the procedure. This may mean changing the level of sedation.  The procedure will be performed. After the procedure  Your blood pressure, heart rate, breathing rate, and blood oxygen level will be monitored until the medicines you were given have worn off.  Do not drive for 24 hours if you received a sedative.  You may:  Feel sleepy, clumsy, or nauseous.  Feel forgetful about what happened after the procedure.  Have a sore throat if you had a breathing tube during the procedure.  Vomit. This information is not intended to replace advice given to you by your health care provider. Make sure you discuss any questions you have with your health care provider. Document Released: 11/03/2004 Document Revised: 07/17/2015 Document Reviewed: 05/31/2015 Elsevier Interactive Patient Education  2017 [place], Care After These  instructions provide you with information about caring for yourself after your procedure. Your health care provider may also give you more specific instructions. Your treatment has been  planned according to current medical practices, but problems sometimes occur. Call your health care provider if you have any problems or questions after your procedure. What can I expect after the procedure? After your procedure, it is common to:  Feel sleepy for several hours.  Feel clumsy and have poor balance for several hours.  Feel forgetful about what happened after the procedure.  Have poor judgment for several hours.  Feel nauseous or vomit.  Have a sore throat if you had a breathing tube during the procedure. Follow these instructions at home: For at least 24 hours after the procedure:   Do not:  Participate in activities in which you could fall or become injured.  Drive.  Use heavy machinery.  Drink alcohol.  Take sleeping pills or medicines that cause drowsiness.  Make important decisions or sign legal documents.  Take care of children on your own.  Rest. Eating and drinking  Follow the diet that is recommended by your health care provider.  If you vomit, drink water, juice, or soup when you can drink without vomiting.  Make sure you have little or no nausea before eating solid foods. General instructions  Have a responsible adult stay with you until you are awake and alert.  Take over-the-counter and prescription medicines only as told by your health care provider.  If you smoke, do not smoke without supervision.  Keep all follow-up visits as told by your health care provider. This is important. Contact a health care provider if:  You keep feeling nauseous or you keep vomiting.  You feel light-headed.  You develop a rash.  You have a fever. Get help right away if:  You have trouble breathing. This information is not intended to replace advice given to you by  your health care provider. Make sure you discuss any questions you have with your health care provider. Document Released: 05/31/2015 Document Revised: 09/30/2015 Document Reviewed: 05/31/2015 Elsevier Interactive Patient Education  2017 [employer].

## 2016-03-03 NOTE — Progress Notes (Signed)
Primary Care Physician: Alonza Bogus, MD  Primary Gastroenterologist:  Barney Drain, MD   Chief Complaint  Patient presents with  . Anemia    HPI: Shane Allison is a 51 y.o. male here for follow up. Seen in 11/2015. H/O ?UGI bleed in 04/2015 in setting of ETOH/NSAIDS. EGD noting normal esophagus with bleeding friable duodenal mucosa. Profuse bleeding from nose day prior to seeing black stool.   After last OV, CBC was normal. His iron/iron sats were elevated. He never had stool studies done as requested.   He presents to f/u loose ends. He states his heartburn Is well controlled on PPI BID. No dysphagia. RUQ pain unrelated to meals or movements. Happens when in the bed sometimes. Last 30 minutes at a time. Sporadic. Pp urgency for many months, not quite a year. No blood in stool. Black every now and then. BM 5-6 times per day.no nocturnal BM. On bentyl without relief. Ibuprofen 6-8 at one time. Couple of times per week. Ongoing alcohol use, most days a sixpack. History of DUI in 1992. States at one point he quit for 4 months.  Current Outpatient Prescriptions  Medication Sig Dispense Refill  . dicyclomine (BENTYL) 10 MG capsule Take 1 capsule (10 mg total) by mouth 4 (four) times daily -  before meals and at bedtime. [id number] capsule 3  . folic acid (FOLVITE) 1 MG tablet Take 1 mg by mouth daily.    . Ixekizumab (TALTZ [place]) Inject into the skin.    Marland Kitchen losartan (COZAAR) 50 MG tablet Take 50 mg by mouth daily.    . pantoprazole (PROTONIX) 40 MG tablet Take 1 tablet (40 mg total) by mouth 2 (two) times daily before a meal. 180 tablet 3   No current facility-administered medications for this visit.     Allergies as of 03/03/2016 - Review Complete 03/03/2016  Allergen Reaction Noted  . Penicillins Rash 09/25/2010   Past Medical History:  Diagnosis Date  . Alcohol use   . Asthma   . Cervical radiculopathy   . Chronic neck pain   . COPD (chronic obstructive pulmonary disease)  ([place])   . Hypertension   . Psoriasis   . Shortness of breath dyspnea    Past Surgical History:  Procedure Laterality Date  . BIOPSY  05/12/2015   Procedure: BIOPSY;  Surgeon: Danie Binder, MD;  Location: [facility];  Service: Endoscopy;;  gastric biopsy  . ESOPHAGOGASTRODUODENOSCOPY (EGD) WITH PROPOFOL N/A 05/12/2015   Dr. Oneida Alar: normal esophagus, gastritis, bleeding friable duodenal mucosa  . HAND TENDON SURGERY    . THUMB AMPUTATION     Partial left thumb    Family History  Problem Relation Age of Onset  . Colon cancer Neg Hx    Social History   Social History  . Marital status: Single    Spouse name: N/A  . Number of children: N/A  . Years of education: N/A   Social History Main Topics  . Smoking status: Current Every Day Smoker    Packs/day: 3.00    Types: Cigarettes  . Smokeless tobacco: Never Used     Comment: 3 packs a day normally, but states "lately I'm down to 1" as of March 2017   . Alcohol use 0.0 oz/week     Comment: 6-12 pack beer at least 5 days a week   . Drug use: No  . Sexual activity: Not Asked   Other Topics Concern  . None   Social  History Narrative  . None    ROS:  General: Negative for anorexia, weight loss, fever, chills, fatigue, weakness. ENT: Negative for hoarseness, difficulty swallowing , nasal congestion. CV: Negative for chest pain, angina, palpitations, dyspnea on exertion, peripheral edema.  Respiratory: Negative for dyspnea at rest, dyspnea on exertion, cough, sputum, wheezing.  GI: See history of present illness. GU:  Negative for dysuria, hematuria, urinary incontinence, urinary frequency, nocturnal urination.  Endo: Negative for unusual weight change.    Physical Examination:   BP (!) 153/97   Pulse 98   Temp 97.8 F (36.6 C) (Oral)   Ht 5\' 7"  (1.702 m)   Wt 150 lb 3.2 oz (68.1 kg)   BMI 23.52 kg/m   General: Well-nourished, well-developed in no acute distress.  Eyes: No icterus. Mouth: Oropharyngeal mucosa  moist and pink , no lesions erythema or exudate. Lungs: Clear to auscultation bilaterally.  Heart: Regular rate and rhythm, no murmurs rubs or gallops.  Abdomen: Bowel sounds are normal, nontender, nondistended, no hepatosplenomegaly or masses, no abdominal bruits or hernia , no rebound or guarding.   Extremities: No lower extremity edema. No clubbing or deformities. Neuro: Alert and oriented x 4   Skin: Warm and dry, no jaundice.   Psych: Alert and cooperative, normal mood and affect.  Labs:  Lab Results  Component Value Date   IRON 197 (H) 12/04/2015   TIBC 307 12/04/2015   FERRITIN 87 12/04/2015   Lab Results  Component Value Date   CREATININE 0.90 05/11/2015   BUN 18 05/11/2015   NA 130 (L) 05/11/2015   K 4.3 05/11/2015   CL 105 05/11/2015   CO2 19 (L) 05/11/2015   Lab Results  Component Value Date   ALT 33 05/10/2015   AST 43 (H) 05/10/2015   ALKPHOS 61 05/10/2015   BILITOT 0.4 05/10/2015   Lab Results  Component Value Date   WBC 5.6 12/04/2015   HGB 14.3 12/04/2015   HCT 42.3 12/04/2015   MCV 94.8 12/04/2015   PLT 318 12/04/2015     Imaging Studies: No results found.   Impression/plan: 51 year old gentleman with history of chronic alcohol abuse, NSAID use who presents for follow-up. Earlier last year hospitalized with possible melena, anemia and noted to have bleeding, friability of the duodenal mucosa on EGD. Also history of profuse epistaxis at that time. Patient has several month history of postprandial loose stools. Bowel movements 5-6 times daily. No overt GI bleeding although states his stools are "black" every now and then. He has vague right upper quadrant discomfort unrelated to meals were activities. Does not occur daily. Notes that he takes ibuprofen 6-8 out of time a couple times per week.  Patient noted to have elevated serum iron and iron saturations possibly related to alcohol use. Ferritin was actually normal. [place] issue to rest we will arrange  for hemachromatosis genetic panel. Also rule out viral hepatitis. Other labs for chronic diarrhea as outlined.  Recommend colonoscopy with deep sedation in the OR given chronic alcohol abuse.  Reason for procedure, change in bowel habits, chronic diarrhea. I have discussed the risks, alternatives, benefits with regards to but not limited to the risk of reaction to medication, bleeding, infection, perforation and the patient is agreeable to proceed. Written consent to be obtained.  Patient has been encouraged to cut back on alcohol use. Also encouraged come back on ibuprofen use.

## 2016-03-04 LAB — IGA: IgA: 347 mg/dL (ref 81–463)

## 2016-03-04 LAB — TISSUE TRANSGLUTAMINASE, IGA: Tissue Transglutaminase Ab, IgA: 1 U/mL (ref <4)

## 2016-03-08 ENCOUNTER — Encounter (HOSPITAL_COMMUNITY): Payer: Self-pay

## 2016-03-08 ENCOUNTER — Encounter (HOSPITAL_COMMUNITY)
Admission: RE | Admit: 2016-03-08 | Discharge: 2016-03-08 | Disposition: A | Discharge status: Home / Self Care | Payer: Medicaid Other | Source: Ambulatory Visit | Attending: Gastroenterology | Admitting: Gastroenterology

## 2016-03-08 DIAGNOSIS — Z01812 Encounter for preprocedural laboratory examination: Secondary | ICD-10-CM | POA: Diagnosis present

## 2016-03-08 HISTORY — DX: Gastro-esophageal reflux disease without esophagitis: K21.9

## 2016-03-08 LAB — BASIC METABOLIC PANEL
Anion gap: 7 (ref 5–15)
BUN: 7 mg/dL (ref 6–20)
CO2: 25 mmol/L (ref 22–32)
Calcium: 8.9 mg/dL (ref 8.9–10.3)
Chloride: 101 mmol/L (ref 101–111)
Creatinine, Ser: 0.92 mg/dL (ref 0.61–1.24)
GFR calc Af Amer: >60 mL/min (ref >60)
GFR calc non Af Amer: >60 mL/min (ref >60)
Glucose, Bld: 88 mg/dL (ref 65–99)
Potassium: 3.6 mmol/L (ref 3.5–5.1)
Sodium: 133 mmol/L — ABNORMAL LOW (ref 135–145)

## 2016-03-08 LAB — CBC
HCT: 40.4 % (ref 39.0–52.0)
Hemoglobin: 13.5 g/dL (ref 13.0–17.0)
MCH: 31.4 pg (ref 26.0–34.0)
MCHC: 33.4 g/dL (ref 30.0–36.0)
MCV: 94 fL (ref 78.0–100.0)
Platelets: 283 10*3/uL (ref 150–400)
RBC: 4.3 MIL/uL (ref 4.22–5.81)
RDW: 11.9 % (ref 11.5–15.5)
WBC: 6.5 10*3/uL (ref 4.0–10.5)

## 2016-03-08 LAB — HEMOCHROMATOSIS DNA-PCR(C282Y,H63D)

## 2016-03-09 ENCOUNTER — Ambulatory Visit: Payer: Medicaid Other | Admitting: Orthopaedic Surgery

## 2016-03-14 ENCOUNTER — Telehealth: Payer: Self-pay

## 2016-03-14 NOTE — Telephone Encounter (Signed)
Pt will be coming in earlier in the morning for his TCS and he is aware(6:30)

## 2016-03-15 ENCOUNTER — Ambulatory Visit (HOSPITAL_COMMUNITY): Payer: Medicaid Other | Admitting: Anesthesiology

## 2016-03-15 ENCOUNTER — Encounter (HOSPITAL_COMMUNITY)
Admission: RE | Disposition: A | Discharge status: Home / Self Care | Payer: Self-pay | Source: Ambulatory Visit | Attending: Gastroenterology

## 2016-03-15 ENCOUNTER — Ambulatory Visit (HOSPITAL_COMMUNITY)
Admission: RE | Admit: 2016-03-15 | Discharge: 2016-03-15 | Disposition: A | Discharge status: Home / Self Care | Payer: Medicaid Other | Source: Ambulatory Visit | Attending: Gastroenterology | Admitting: Gastroenterology

## 2016-03-15 ENCOUNTER — Encounter (HOSPITAL_COMMUNITY): Payer: Self-pay

## 2016-03-15 DIAGNOSIS — D128 Benign neoplasm of rectum: Secondary | ICD-10-CM

## 2016-03-15 DIAGNOSIS — F17210 Nicotine dependence, cigarettes, uncomplicated: Secondary | ICD-10-CM | POA: Diagnosis not present

## 2016-03-15 DIAGNOSIS — R197 Diarrhea, unspecified: Secondary | ICD-10-CM

## 2016-03-15 DIAGNOSIS — F17200 Nicotine dependence, unspecified, uncomplicated: Secondary | ICD-10-CM | POA: Diagnosis not present

## 2016-03-15 DIAGNOSIS — Z79899 Other long term (current) drug therapy: Secondary | ICD-10-CM | POA: Insufficient documentation

## 2016-03-15 DIAGNOSIS — I10 Essential (primary) hypertension: Secondary | ICD-10-CM | POA: Diagnosis not present

## 2016-03-15 DIAGNOSIS — K648 Other hemorrhoids: Secondary | ICD-10-CM | POA: Diagnosis not present

## 2016-03-15 DIAGNOSIS — J449 Chronic obstructive pulmonary disease, unspecified: Secondary | ICD-10-CM | POA: Insufficient documentation

## 2016-03-15 DIAGNOSIS — K621 Rectal polyp: Secondary | ICD-10-CM | POA: Insufficient documentation

## 2016-03-15 DIAGNOSIS — K219 Gastro-esophageal reflux disease without esophagitis: Secondary | ICD-10-CM | POA: Insufficient documentation

## 2016-03-15 DIAGNOSIS — Q438 Other specified congenital malformations of intestine: Secondary | ICD-10-CM | POA: Diagnosis not present

## 2016-03-15 HISTORY — PX: COLONOSCOPY WITH PROPOFOL: SHX5780

## 2016-03-15 HISTORY — PX: POLYPECTOMY: SHX5525

## 2016-03-15 HISTORY — PX: BIOPSY: SHX5522

## 2016-03-15 SURGERY — COLONOSCOPY WITH PROPOFOL
Anesthesia: Monitor Anesthesia Care

## 2016-03-15 MED ORDER — FENTANYL CITRATE (PF) 100 MCG/2ML IJ SOLN
25.0000 ug | INTRAMUSCULAR | Status: AC | PRN
  Administered 2016-03-15 (×2): 25 ug via INTRAVENOUS

## 2016-03-15 MED ORDER — MIDAZOLAM HCL 2 MG/2ML IJ SOLN
0.5000 mg | INTRAMUSCULAR | Status: DC | PRN
  Administered 2016-03-15: 2 mg via INTRAVENOUS

## 2016-03-15 MED ORDER — PROPOFOL 500 MG/50ML IV EMUL
INTRAVENOUS | Status: DC | PRN
  Administered 2016-03-15: 125 ug/kg/min via INTRAVENOUS

## 2016-03-15 MED ORDER — STERILE WATER FOR IRRIGATION IR SOLN
Status: DC | PRN
  Administered 2016-03-15: 100 mL

## 2016-03-15 MED ORDER — ALBUTEROL SULFATE (2.5 MG/3ML) 0.083% IN NEBU
2.5000 mg | INHALATION_SOLUTION | Freq: Once | RESPIRATORY_TRACT | Status: AC
  Administered 2016-03-15: 2.5 mg via RESPIRATORY_TRACT

## 2016-03-15 MED ORDER — CHLORHEXIDINE GLUCONATE CLOTH 2 % EX PADS: 6.0000 | MEDICATED_PAD | Freq: Once | CUTANEOUS | Status: DC

## 2016-03-15 MED ORDER — MIDAZOLAM HCL 2 MG/2ML IJ SOLN
INTRAMUSCULAR | Status: AC
  Filled 2016-03-15: qty 2

## 2016-03-15 MED ORDER — SODIUM CHLORIDE 0.9 % IJ SOLN
INTRAMUSCULAR | Status: AC
  Filled 2016-03-15: qty 10

## 2016-03-15 MED ORDER — ALBUTEROL SULFATE (2.5 MG/3ML) 0.083% IN NEBU
INHALATION_SOLUTION | RESPIRATORY_TRACT | Status: AC
  Filled 2016-03-15: qty 3

## 2016-03-15 MED ORDER — MIDAZOLAM HCL 5 MG/5ML IJ SOLN
INTRAMUSCULAR | Status: DC | PRN
  Administered 2016-03-15: 2 mg via INTRAVENOUS

## 2016-03-15 MED ORDER — FENTANYL CITRATE (PF) 100 MCG/2ML IJ SOLN
INTRAMUSCULAR | Status: AC
  Filled 2016-03-15: qty 2

## 2016-03-15 MED ORDER — PROPOFOL 10 MG/ML IV BOLUS
INTRAVENOUS | Status: AC
  Filled 2016-03-15: qty 40

## 2016-03-15 MED ORDER — EPHEDRINE SULFATE 50 MG/ML IJ SOLN
INTRAMUSCULAR | Status: AC
  Filled 2016-03-15: qty 1

## 2016-03-15 MED ORDER — DICYCLOMINE HCL 20 MG PO TABS: ORAL_TABLET | ORAL | 11 refills | Status: DC

## 2016-03-15 MED ORDER — LACTATED RINGERS IV SOLN
INTRAVENOUS | Status: DC
  Administered 2016-03-15: 1000 mL via INTRAVENOUS

## 2016-03-15 NOTE — H&P (Signed)
Primary Care Physician:  Alonza Bogus, MD Primary Gastroenterologist:  Dr. Oneida Alar  Pre-Procedure History & Physical: HPI:  Shane Allison is a 51 y.o. male here for  DIARRHEA.  Past Medical History:  Diagnosis Date  . Alcohol use   . Asthma   . Cervical radiculopathy   . Chronic neck pain   . COPD (chronic obstructive pulmonary disease) ([place])   . GERD (gastroesophageal reflux disease)   . Hypertension   . Psoriasis   . Shortness of breath dyspnea     Past Surgical History:  Procedure Laterality Date  . BIOPSY  05/12/2015   Procedure: BIOPSY;  Surgeon: Danie Binder, MD;  Location: [facility];  Service: Endoscopy;;  gastric biopsy  . ESOPHAGOGASTRODUODENOSCOPY (EGD) WITH PROPOFOL N/A 05/12/2015   Dr. Oneida Alar: normal esophagus, gastritis, bleeding friable duodenal mucosa  . HAND TENDON SURGERY Right   . THUMB AMPUTATION     Partial left thumb     Prior to Admission medications   Medication Sig Start Date End Date Taking? Authorizing Provider  dicyclomine (BENTYL) 10 MG capsule Take 1 capsule (10 mg total) by mouth 4 (four) times daily -  before meals and at bedtime. 12/04/15  Yes Annitta Needs, NP  Ixekizumab (TALTZ) 80 MG/ML SOAJ Inject 80 m into the skin every 30 (thirty) days.    Yes Historical Provider, MD  losartan (COZAAR) 50 MG tablet Take 50 mg by mouth daily.   Yes Historical Provider, MD  pantoprazole (PROTONIX) 40 MG tablet Take 1 tablet (40 mg total) by mouth 2 (two) times daily before a meal. 12/04/15  Yes Annitta Needs, NP  Sod Picosulfate-Mag Ox-Cit Acd ([place]) 10-3.5-12 MG-GM-GM PACK Take 1 Container by mouth as directed. 03/03/16   Danie Binder, MD    Allergies as of 03/03/2016 - Review Complete 03/03/2016  Allergen Reaction Noted  . Penicillins Rash 09/25/2010    Family History  Problem Relation Age of Onset  . Colon cancer Neg Hx     Social History   Social History  . Marital status: Single    Spouse name: N/A  . Number of children:  N/A  . Years of education: N/A   Occupational History  . Not on file.   Social History Main Topics  . Smoking status: Current Every Day Smoker    Packs/day: 1.00    Years: 40.00    Types: Cigarettes  . Smokeless tobacco: Never Used     Comment: 3 packs a day normally, but states "lately I'm down to 1" as of March 2017   . Alcohol use 0.0 oz/week     Comment: 6-12 pack beer at least 5 days a week   . Drug use: No  . Sexual activity: Not Currently    Birth control/ protection: None   Other Topics Concern  . Not on file   Social History Narrative  . No narrative on file    Review of Systems: See HPI, otherwise negative ROS   Physical Exam: BP 122/84   Pulse 90   Temp 97.6 F (36.4 C) (Oral)   Resp 19   Ht 5\' 7"  (1.702 m)   Wt 153 lb (69.4 kg)   SpO2 100%   BMI 23.96 kg/m  General:   Alert,  pleasant and cooperative in NAD Head:  Normocephalic and atraumatic. Neck:  Supple; Lungs:  Clear throughout to auscultation.    Heart:  Regular rate and rhythm. Abdomen:  Soft, nontender and nondistended. Normal bowel  sounds, without guarding, and without rebound.   Neurologic:  Alert and  oriented x4;  grossly normal neurologically.  Impression/Plan:     Diarrhea  PLAN: TCS TODAY WITH BIOPSY. DISCUSSED PROCEDURE, BENEFITS, & RISKS: < 1% chance of medication reaction, bleeding, perforation, or rupture of spleen/liver.

## 2016-03-15 NOTE — Anesthesia Postprocedure Evaluation (Signed)
Anesthesia Post Note  Patient: Shane Allison  Procedure(s) Performed: Procedure(s) (LRB): COLONOSCOPY WITH PROPOFOL (N/A) BIOPSY POLYPECTOMY  Patient location during evaluation: PACU Anesthesia Type: MAC Level of consciousness: awake, oriented and patient cooperative Pain management: pain level controlled Vital Signs Assessment: post-procedure vital signs reviewed and stable Respiratory status: spontaneous breathing, nonlabored ventilation and respiratory function stable Cardiovascular status: blood pressure returned to baseline Postop Assessment: no signs of nausea or vomiting Anesthetic complications: no     Last Vitals:  Vitals:   03/15/16 0725 03/15/16 0730  BP: 124/85 125/88  Pulse:    Resp: 20 20  Temp:      Last Pain:  Vitals:   03/15/16 0638  TempSrc: Oral                 Kalab Camps J

## 2016-03-15 NOTE — Op Note (Signed)
[facility] Patient Name: Shane Allison Procedure Date: 03/15/2016 7:33 AM MRN: [id number] Date of Birth: 05-16-1965 Attending MD: Barney Drain , MD CSN: [id number] Age: 51 Admit Type: Outpatient Procedure:                Colonoscopy with cold forceps biopsy and snare                            polypectomy Indications:              Clinically significant diarrhea of unexplained                            origin Providers:                Barney Drain, MD, Rosina Lowenstein, RN, Randa Spike,                            Technician Referring MD:             Campbell Lerner Medicines:                Propofol per Anesthesia Complications:            No immediate complications. Estimated Blood Loss:     Estimated blood loss was minimal. Procedure:                Pre-Anesthesia Assessment:                           - Prior to the procedure, a History and Physical                            was performed, and patient medications and                            allergies were reviewed. The patient's tolerance of                            previous anesthesia was also reviewed. The risks                            and benefits of the procedure and the sedation                            options and risks were discussed with the patient.                            All questions were answered, and informed consent                            was obtained. Prior Anticoagulants: The patient has                            taken no previous anticoagulant or antiplatelet                            agents. ASA Grade Assessment:  II - A patient with                            mild systemic disease. After reviewing the risks                            and benefits, the patient was deemed in                            satisfactory condition to undergo the procedure.                            After obtaining informed consent, the colonoscope                            was passed under direct vision.  Throughout the                            procedure, the patient's blood pressure, pulse, and                            oxygen saturations were monitored continuously. The                            EC-[id number]) scope was introduced through                            the anus and advanced to the 10 cm into the ileum.                            The terminal ileum, ileocecal valve, appendiceal                            orifice, and rectum were photographed. The                            colonoscopy was performed without difficulty. The                            patient tolerated the procedure well. The quality                            of the bowel preparation was excellent. Scope In: 7:50:28 AM Scope Out: 8:06:20 AM Scope Withdrawal Time: 0 hours 13 minutes 0 seconds  Total Procedure Duration: 0 hours 15 minutes 52 seconds  Findings:      The terminal ileum appeared normal.      A 6 mm polyp was found in the rectum. The polyp was sessile. The polyp       was removed with a hot snare. Resection and retrieval were complete.      The recto-sigmoid colon was mildly redundant. Biopsies for histology       were taken with a cold forceps from the cecum, ascending colon,       transverse colon and descending colon for evaluation of microscopic  colitis.      Internal hemorrhoids were found. The hemorrhoids were large. Impression:               - NO OBVIOUS SOURCE FOR DIARRHEA IDENTIFIED                           - One 6 mm polyp in the rectum, removed with a hot                            snare. Resected and retrieved.                           - Redundant RS colon.                           - Internal hemorrhoids. Moderate Sedation:      Per Anesthesia Care Recommendation:           - High fiber diet and lactose free diet.                           - Continue present medications. INCREASE BENTYL TO                            20 MG QAC BREAKFAST AND LUNCH AND AT BEDTIME                            - Await pathology results.                           - Repeat colonoscopy in 5-10 years for surveillance.                           - Return to GI office in 4 months.                           - Patient has a contact number available for                            emergencies. The signs and symptoms of potential                            delayed complications were discussed with the                            patient. Return to normal activities tomorrow.                            Written discharge instructions were provided to the                            patient. Procedure Code(s):        --- Professional ---                           [phone], Colonoscopy, flexible; with removal  of                            tumor(s), polyp(s), or other lesion(s) by snare                            technique                           [id number], 59, Colonoscopy, flexible; with biopsy,                            single or multiple Diagnosis Code(s):        --- Professional ---                           K64.8, Other hemorrhoids                           K62.1, Rectal polyp                           R19.7, Diarrhea, unspecified                           Q43.8, Other specified congenital malformations of                            intestine CPT copyright 2016 [employer]. All rights reserved. The codes documented in this report are preliminary and upon coder review may  be revised to meet current compliance requirements. Barney Drain, MD Barney Drain, MD 03/15/2016 8:17:59 AM This report has been signed electronically. Number of Addenda: 0

## 2016-03-15 NOTE — Discharge Instructions (Addendum)
NO OBVIOUS SOURCE FOR YOUR DIARRHEA WAS IDENTIFIED. THE LAST PART OF YOUR SMALL BOWEL IS NORMAL. You had 1 polyp removed FROM YOUR RECTUM. You have LARGE internal hemorrhoids.  I BIOPSIED YOUR COLON.    YOU SHOULD AVOID ALCOHOL FOR 1 MONTH.  FOLLOW A HIGH FIBER/LACTOSE FREE DIET. AVOID ITEMS THAT CAUSE BLOATING & GAS. SEE INFO BELOW.  INCREASE DICYCLOMINE TO 20 MG. TAKE DICYCLOMINE 30 MINUTES PRIOR TO BREAKFAST AND LUNCH AND AT BEDTIME. IT MAY CAUSE DROWSINESS, DRY EYES/MOUTH, BLURRY VISION, OR DIFFICULTY URINATING.  YOUR BIOPSY RESULTS WILL BE AVAILABLE IN MY CHART AFTER JAN 26 AND MY OFFICE WILL CONTACT YOU IN 10-14 DAYS WITH YOUR RESULTS.   PLEASE CALL IN TWO MONTHS IF YOUR SYMPTOMS ARE NOT IMPROVED.   FOLLOW UP IN 4 MOS.  Neil Crouch - May 23rd at 10:30  Next colonoscopy in 5-10 years.    Colonoscopy Care After Read the instructions outlined below and refer to this sheet in the next week. These discharge instructions provide you with general information on caring for yourself after you leave the hospital. While your treatment has been planned according to the most current medical practices available, unavoidable complications occasionally occur. If you have any problems or questions after discharge, call DR. Kashius Dominic, [phone].  ACTIVITY  You may resume your regular activity, but move at a slower pace for the next 24 hours.   Take frequent rest periods for the next 24 hours.   Walking will help get rid of the air and reduce the bloated feeling in your belly (abdomen).   No driving for 24 hours (because of the medicine (anesthesia) used during the test).   You may shower.   Do not sign any important legal documents or operate any machinery for 24 hours (because of the anesthesia used during the test).    NUTRITION  Drink plenty of fluids.   You may resume your normal diet as instructed by your doctor.   Begin with a light meal and progress to your normal diet. Heavy  or fried foods are harder to digest and may make you feel sick to your stomach (nauseated).   Avoid alcoholic beverages for 24 hours or as instructed.    MEDICATIONS  You may resume your normal medications.   WHAT YOU CAN EXPECT TODAY  Some feelings of bloating in the abdomen.   Passage of more gas than usual.   Spotting of blood in your stool or on the toilet paper  .  IF YOU HAD POLYPS REMOVED DURING THE COLONOSCOPY:  Eat a soft diet IF YOU HAVE NAUSEA, BLOATING, ABDOMINAL PAIN, OR VOMITING.    FINDING OUT THE RESULTS OF YOUR TEST Not all test results are available during your visit. DR. Oneida Alar WILL CALL YOU WITHIN 14 DAYS OF YOUR PROCEDUE WITH YOUR RESULTS. Do not assume everything is normal if you have not heard from DR. Cythnia Osmun, CALL HER OFFICE AT [phone].  SEEK IMMEDIATE MEDICAL ATTENTION AND CALL THE OFFICE: [phone] IF:  You have more than a spotting of blood in your stool.   Your belly is swollen (abdominal distention).   You are nauseated or vomiting.   You have a temperature over 101F.   You have abdominal pain or discomfort that is severe or gets worse throughout the day.   Lactose Free Diet Lactose is a carbohydrate that is found mainly in milk and milk products, as well as in foods with added milk or whey. Lactose must be digested by the  enzyme in order to be used by the body. Lactose intolerance occurs when there is a shortage of lactase. When your body is not able to digest lactose, you may feel sick to your stomach (nausea), bloating, cramping, gas and diarrhea.  There are many dairy products that may be tolerated better than milk by some people:  The use of cultured dairy products such as yogurt, buttermilk, cottage cheese, and sweet acidophilus milk (Kefir) for lactase-deficient individuals is usually well tolerated. This is because the healthy bacteria help digest lactose.   Lactose-hydrolyzed milk (Lactaid) contains 40-90% less lactose  than milk and may also be well tolerated.    SPECIAL NOTES  Lactose is a carbohydrates. The major food source is dairy products. Reading food labels is important. Many products contain lactose even when they are not made from milk. Look for the following words: whey, milk solids, dry milk solids, nonfat dry milk powder. Typical sources of lactose other than dairy products include breads, candies, cold cuts, prepared and processed foods, and commercial sauces and gravies.   All foods must be prepared without milk, cream, or other dairy foods.   Soy milk and lactose-free supplements (LACTASE) may be used as an alternative to milk.   FOOD GROUP ALLOWED/RECOMMENDED AVOID/USE SPARINGLY  BREADS / STARCHES 4 servings or more* Breads and rolls made without milk. [place], [place], or [place] bread. Breads and rolls that contain milk. Prepared mixes such as muffins, biscuits, waffles, pancakes. Sweet rolls, donuts, [place] toast (if made with milk or lactose).  Crackers: Soda crackers, graham crackers. Any crackers prepared without lactose. Zwieback crackers, corn curls, or any that contain lactose.  Cereals: Cooked or dry cereals prepared without lactose (read labels). Cooked or dry cereals prepared with lactose (read labels). Total, Cocoa Krispies. Special K.  Potatoes / Pasta / Rice: Any prepared without milk or lactose. Popcorn. Instant potatoes, frozen [place] fries, scalloped or au gratin potatoes.  VEGETABLES 2 servings or more Fresh, frozen, and canned vegetables. Creamed or breaded vegetables. Vegetables in a cheese sauce or with lactose-containing margarines.  FRUIT 2 servings or more All fresh, canned, or frozen fruits that are not processed with lactose. Any canned or frozen fruits processed with lactose.  MEAT & SUBSTITUTES 2 servings or more (4 to 6 oz. total per day) Plain beef, chicken, fish, [place], lamb, veal, pork, or ham. Kosher prepared meat products. Strained or junior meats that do  not contain milk. Eggs, soy meat substitutes, nuts. Scrambled eggs, omelets, and souffles that contain milk. Creamed or breaded meat, fish, or fowl. Sausage products such as wieners, liver sausage, or cold cuts that contain milk solids. Cheese, cottage cheese, or cheese spreads.  MILK None. (See BEVERAGES for milk substitutes. See DESSERTS for ice cream and frozen desserts.) Milk (whole, 2%, skim, or chocolate). Evaporated, powdered, or condensed milk; malted milk.  SOUPS & COMBINATION FOODS Bouillon, broth, vegetable soups, clear soups, consomms. Homemade soups made with allowed ingredients. Combination or prepared foods that do not contain milk or milk products (read labels). Cream soups, chowders, commercially prepared soups containing lactose. Macaroni and cheese, pizza. Combination or prepared foods that contain milk or milk products.  DESSERTS & SWEETS In moderation Water and fruit ices; gelatin; angel food cake. Homemade cookies, pies, or cakes made from allowed ingredients. Pudding (if made with water or a milk substitute). Lactose-free tofu desserts. Sugar, honey, corn syrup, jam, jelly; marmalade; molasses (beet sugar); Pure sugar candy; marshmallows. Ice cream, ice milk, sherbet, custard, pudding, frozen yogurt. Chiropractor  cake and cookie mixes. Desserts that contain chocolate. Pie crust made with milk-containing margarine; reduced-calorie desserts made with a sugar substitute that contains lactose. Toffee, peppermint, butterscotch, chocolate, caramels.  FATS & OILS In moderation Butter (as tolerated; contains very small amounts of lactose). Margarines and dressings that do not contain milk, Vegetable oils, shortening, Miracle Whip, mayonnaise, nondairy cream & whipped toppings without lactose or milk solids added (examples: Coffee Rich, Carnation Coffeemate, Rich's Whipped Topping, PolyRich). Berniece Salines. Margarines and salad dressings containing milk; cream, cream cheese; peanut butter with  added milk solids, sour cream, chip dips, made with sour cream.  BEVERAGES Carbonated drinks; tea; coffee and freeze-dried coffee; some instant coffees (check labels). Fruit drinks; fruit and vegetable juice; Rice or Soy milk. Ovaltine, hot chocolate. Some cocoas; some instant coffees; instant iced teas; powdered fruit drinks (read labels).   CONDIMENTS / MISCELLANEOUS Soy sauce, carob powder, olives, gravy made with water, baker's cocoa, pickles, pure seasonings and spices, wine, pure monosodium glutamate, catsup, mustard. Some chewing gums, chocolate, some cocoas. Certain antibiotics and vitamin / mineral preparations. Spice blends if they contain milk products. MSG extender. Artificial sweeteners that contain lactose such as Equal (Nutra-Sweet) and Sweet 'n Low. Some nondairy creamers (read labels).   SAMPLE MENU*  Breakfast   Orange Juice.  Banana.   Bran flakes.   Nondairy Creamer.  [place] Bread (toasted).   Butter or milk-free margarine.   Coffee or tea.    Noon Meal   Chicken Breast.  Rice.   Green beans.   Butter or milk-free margarine.  Fresh melon.   Coffee or tea.    Evening Meal   Roast Beef.  Baked potato.   Butter or milk-free margarine.   Broccoli.   Lettuce salad with vinegar and oil dressing.  [employer]   Coffee or tea.        High-Fiber Diet A high-fiber diet changes your normal diet to include more whole grains, legumes, fruits, and vegetables. Changes in the diet involve replacing refined carbohydrates with unrefined foods. The calorie level of the diet is essentially unchanged. The Dietary Reference Intake (recommended amount) for adult males is 38 grams per day. For adult females, it is 25 grams per day. Pregnant and lactating women should consume 28 grams of fiber per day. Fiber is the intact part of a plant that is not broken down during digestion. Functional fiber is fiber that has been isolated from the plant to provide a  beneficial effect in the body. PURPOSE  Increase stool bulk.   Ease and regulate bowel movements.   Lower cholesterol.   REDUCE RISK OF COLON CANCER  INDICATIONS THAT YOU NEED MORE FIBER  Constipation and hemorrhoids.   Uncomplicated diverticulosis (intestine condition) and irritable bowel syndrome.   Weight management.   As a protective measure against hardening of the arteries (atherosclerosis), diabetes, and cancer.   GUIDELINES FOR INCREASING FIBER IN THE DIET  Start adding fiber to the diet slowly. A gradual increase of about 5 more grams (2 slices of whole-wheat bread, 2 servings of most fruits or vegetables, or 1 bowl of high-fiber cereal) per day is best. Too rapid an increase in fiber may result in constipation, flatulence, and bloating.   Drink enough water and fluids to keep your urine clear or pale yellow. Water, juice, or caffeine-free drinks are recommended. Not drinking enough fluid may cause constipation.   Eat a variety of high-fiber foods rather than one type of fiber.   Try to increase your  intake of fiber through using high-fiber foods rather than fiber pills or supplements that contain small amounts of fiber.   The goal is to change the types of food eaten. Do not supplement your present diet with high-fiber foods, but replace foods in your present diet.   INCLUDE A VARIETY OF FIBER SOURCES  Replace refined and processed grains with whole grains, canned fruits with fresh fruits, and incorporate other fiber sources. White rice, white breads, and most bakery goods contain little or no fiber.   Brown whole-grain rice, buckwheat oats, and many fruits and vegetables are all good sources of fiber. These include: broccoli, Brussels sprouts, cabbage, cauliflower, beets, sweet potatoes, white potatoes (skin on), carrots, tomatoes, eggplant, squash, berries, fresh fruits, and dried fruits.   Cereals appear to be the richest source of fiber. Cereal fiber is found in  whole grains and bran. Bran is the fiber-rich outer coat of cereal grain, which is largely removed in refining. In whole-grain cereals, the bran remains. In breakfast cereals, the largest amount of fiber is found in those with "bran" in their names. The fiber content is sometimes indicated on the label.   You may need to include additional fruits and vegetables each day.   In baking, for 1 cup white flour, you may use the following substitutions:   1 cup whole-wheat flour minus 2 tablespoons.   1/2 cup white flour plus 1/2 cup whole-wheat flour.   Polyps, Colon  A polyp is extra tissue that grows inside your body. Colon polyps grow in the large intestine. The large intestine, also called the colon, is part of your digestive system. It is a long, hollow tube at the end of your digestive tract where your body makes and stores stool. Most polyps are not dangerous. They are benign. This means they are not cancerous. But over time, some types of polyps can turn into cancer. Polyps that are smaller than a pea are usually not harmful. But larger polyps could someday become or may already be cancerous. To be safe, doctors remove all polyps and test them.   PREVENTION There is not one sure way to prevent polyps. You might be able to lower your risk of getting them if you:  Eat more fruits and vegetables and less fatty food.   Do not smoke.   Avoid alcohol.   Exercise every day.   Lose weight if you are overweight.   Eating more calcium and folate can also lower your risk of getting polyps. Some foods that are rich in calcium are milk, cheese, and broccoli. Some foods that are rich in folate are chickpeas, kidney beans, and spinach.   Hemorrhoids Hemorrhoids are dilated (enlarged) veins around the rectum. Sometimes clots will form in the veins. This makes them swollen and painful. These are called thrombosed hemorrhoids. Causes of hemorrhoids include:  Constipation.   Straining to have a  bowel movement.   HEAVY LIFTING  HOME CARE INSTRUCTIONS  Eat a well balanced diet and drink 6 to 8 glasses of water every day to avoid constipation. You may also use a bulk laxative.   Avoid straining to have bowel movements.   Keep anal area dry and clean.   Do not use a donut shaped pillow or sit on the toilet for long periods. This increases blood pooling and pain.   Move your bowels when your body has the urge; this will require less straining and will decrease pain and pressure.

## 2016-03-15 NOTE — Anesthesia Preprocedure Evaluation (Signed)
Anesthesia Evaluation  Patient identified by MRN, date of birth, ID band Patient awake    Reviewed: Allergy & Precautions, NPO status , Patient's Chart, lab work & pertinent test results  Airway Mallampati: II  TM Distance: >3 FB     Dental  (+) Poor Dentition, Chipped,    Pulmonary shortness of breath and with exertion, asthma , COPD,  COPD inhaler, Current Smoker,     + decreased breath sounds      Cardiovascular hypertension, Pt. on medications Normal cardiovascular exam     Neuro/Psych  Headaches,  Neuromuscular disease    GI/Hepatic (+)     substance abuse  alcohol use,   Endo/Other    Renal/GU Renal InsufficiencyRenal disease     Musculoskeletal  (+) Arthritis , Osteoarthritis,    Abdominal Normal abdominal exam  (+)   Peds  Hematology   Anesthesia Other Findings   Reproductive/Obstetrics                             Anesthesia Physical Anesthesia Plan  ASA: III  Anesthesia Plan: MAC   Post-op Pain Management:    Induction: Intravenous  Airway Management Planned: Simple Face Mask  Additional Equipment:   Intra-op Plan:   Post-operative Plan:   Informed Consent: I have reviewed the patients History and Physical, chart, labs and discussed the procedure including the risks, benefits and alternatives for the proposed anesthesia with the patient or authorized representative who has indicated his/her understanding and acceptance.     Plan Discussed with: CRNA  Anesthesia Plan Comments: (Albuterol pre op )        Anesthesia Quick Evaluation

## 2016-03-15 NOTE — Transfer of Care (Signed)
Immediate Anesthesia Transfer of Care Note  Patient: Shane Allison  Procedure(s) Performed: Procedure(s) with comments: COLONOSCOPY WITH PROPOFOL (N/A) - 12:30 PM BIOPSY - random colon bx's POLYPECTOMY - rectal polyp  Patient Location: PACU  Anesthesia Type:MAC  Level of Consciousness: awake and patient cooperative  Airway & Oxygen Therapy: Patient Spontanous Breathing  Post-op Assessment: Report given to RN, Post -op Vital signs reviewed and stable and Patient moving all extremities  Post vital signs: Reviewed and stable  Last Vitals:  Vitals:   03/15/16 0725 03/15/16 0730  BP: 124/85 125/88  Pulse:    Resp: 20 20  Temp:      Last Pain:  Vitals:   03/15/16 0638  TempSrc: Oral      Patients Stated Pain Goal: 8 (83/[birth year] 8264)  Complications: No apparent anesthesia complications

## 2016-03-16 ENCOUNTER — Ambulatory Visit (INDEPENDENT_AMBULATORY_CARE_PROVIDER_SITE_OTHER): Payer: Medicaid Other | Admitting: Orthopaedic Surgery

## 2016-03-16 ENCOUNTER — Encounter: Payer: Self-pay | Admitting: Orthopaedic Surgery

## 2016-03-16 VITALS — BP 147/92 | HR 79 | Temp 98.1°F | Ht 67.0 in | Wt 152.0 lb

## 2016-03-16 DIAGNOSIS — G8929 Other chronic pain: Secondary | ICD-10-CM

## 2016-03-16 DIAGNOSIS — J449 Chronic obstructive pulmonary disease, unspecified: Secondary | ICD-10-CM

## 2016-03-16 DIAGNOSIS — M25511 Pain in right shoulder: Secondary | ICD-10-CM | POA: Diagnosis not present

## 2016-03-16 DIAGNOSIS — F17210 Nicotine dependence, cigarettes, uncomplicated: Secondary | ICD-10-CM

## 2016-03-16 MED ORDER — HYDROCODONE-ACETAMINOPHEN 5-325 MG PO TABS: ORAL_TABLET | ORAL | 0 refills | Status: DC

## 2016-03-16 NOTE — Progress Notes (Signed)
Patient [id number] Shane Allison, male DOB:[birth year], 51 y.o. [id number]  Chief Complaint  Patient presents with  . Follow-up    chronic right shoulder pain    HPI  Shane Allison is a 51 y.o. male who has chronic pain of the right shoulder. He is stable.  He has no paresthesia.  He has no new trauma, no redness.  He is active and doing his exercises. HPI  Body mass index is 23.81 kg/m.  ROS  Review of Systems  HENT: Negative for congestion.   Respiratory: Positive for cough and shortness of breath.   Cardiovascular: Negative for chest pain and leg swelling.  Endocrine: Negative for cold intolerance.  Musculoskeletal: Positive for arthralgias and myalgias.  Allergic/Immunologic: Positive for environmental allergies.    Past Medical History:  Diagnosis Date  . Alcohol use   . Asthma   . Cervical radiculopathy   . Chronic neck pain   . COPD (chronic obstructive pulmonary disease) ([place])   . GERD (gastroesophageal reflux disease)   . Hypertension   . Psoriasis   . Shortness of breath dyspnea     Past Surgical History:  Procedure Laterality Date  . BIOPSY  05/12/2015   Procedure: BIOPSY;  Surgeon: Danie Binder, MD;  Location: [facility];  Service: Endoscopy;;  gastric biopsy  . ESOPHAGOGASTRODUODENOSCOPY (EGD) WITH PROPOFOL N/A 05/12/2015   Dr. Oneida Alar: normal esophagus, gastritis, bleeding friable duodenal mucosa  . HAND TENDON SURGERY Right   . THUMB AMPUTATION     Partial left thumb     Family History  Problem Relation Age of Onset  . Colon cancer Neg Hx     Social History Social History  Substance Use Topics  . Smoking status: Current Every Day Smoker    Packs/day: 1.00    Years: 40.00    Types: Cigarettes  . Smokeless tobacco: Never Used     Comment: 3 packs a day normally, but states "lately I'm down to 1" as of March 2017   . Alcohol use 0.0 oz/week     Comment: 6-12 pack beer at least 5 days a week     Allergies  Allergen Reactions  .  Penicillins Rash    Has patient had a PCN reaction causing immediate rash, facial/tongue/throat swelling, SOB or lightheadedness with hypotension: Yes Has patient had a PCN reaction causing severe rash involving mucus membranes or skin necrosis: No Has patient had a PCN reaction that required hospitalization No Has patient had a PCN reaction occurring within the last 10 years: No If all of the above answers are "NO", then may proceed with Cephalosporin use.      Current Outpatient Prescriptions  Medication Sig Dispense Refill  . dicyclomine (BENTYL) 20 MG tablet 1 po 30 mins before breakfast and lunch and at bedtime 90 tablet 11  . HYDROcodone-acetaminophen (NORCO/VICODIN) 5-325 MG tablet One tablet by mouth every six hours as needed for pain.  Seven day limit per Medicaid guidelines. 28 tablet 0  . Ixekizumab (TALTZ) 80 MG/ML SOAJ Inject 80 m into the skin every 30 (thirty) days.     Marland Kitchen losartan (COZAAR) 50 MG tablet Take 50 mg by mouth daily.    . pantoprazole (PROTONIX) 40 MG tablet Take 1 tablet (40 mg total) by mouth 2 (two) times daily before a meal. 180 tablet 3   No current facility-administered medications for this visit.      Physical Exam  Blood pressure (!) 147/92, pulse 79, temperature 98.1 F (36.7 C), height  5\' 7"  (1.702 m), weight 152 lb (68.9 kg).  Constitutional: overall normal hygiene, normal nutrition, well developed, normal grooming, normal body habitus. Assistive device:none  Musculoskeletal: gait and station Limp none, muscle tone and strength are normal, no tremors or atrophy is present.  .  Neurological: coordination overall normal.  Deep tendon reflex/nerve stretch intact.  Sensation normal.  Cranial nerves II-XII intact.   Skin:   Normal overall no scars, lesions, ulcers or rashes. No psoriasis.  Psychiatric: Alert and oriented x 3.  Recent memory intact, remote memory unclear.  Normal mood and affect. Well groomed.  Good eye contact.  Cardiovascular:  overall no swelling, no varicosities, no edema bilaterally, normal temperatures of the legs and arms, no clubbing, cyanosis and good capillary refill.  Lymphatic: palpation is normal.  He has full ROM of the right shoulder today with slight tenderness.  NV intact.  Grips are normal.  He has cut back on his smoking and is on Chantex.  The patient has been educated about the nature of the problem(s) and counseled on treatment options.  The patient appeared to understand what I have discussed and is in agreement with it.  Encounter Diagnoses  Name Primary?  . Chronic right shoulder pain Yes  . Chronic obstructive pulmonary disease, unspecified COPD type ([place])   . Cigarette nicotine dependence without complication     PLAN Call if any problems.  Precautions discussed.  Continue current medications.   Return to clinic 3 months   Pain medicine given for 7 days after state site reviewed on web.  Electronically Signed Sanjuana Kava, MD 1/24/20182:47 PM

## 2016-03-16 NOTE — Patient Instructions (Signed)
Steps to Quit Smoking Smoking tobacco can be bad for your health. It can also affect almost every organ in your body. Smoking puts you and people around you at risk for many serious long-lasting (chronic) diseases. Quitting smoking is hard, but it is one of the best things that you can do for your health. It is never too late to quit. What are the benefits of quitting smoking? When you quit smoking, you lower your risk for getting serious diseases and conditions. They can include:  Lung cancer or lung disease.  Heart disease.  Stroke.  Heart attack.  Not being able to have children (infertility).  Weak bones (osteoporosis) and broken bones (fractures). If you have coughing, wheezing, and shortness of breath, those symptoms may get better when you quit. You may also get sick less often. If you are pregnant, quitting smoking can help to lower your chances of having a baby of low birth weight. What can I do to help me quit smoking? Talk with your doctor about what can help you quit smoking. Some things you can do (strategies) include:  Quitting smoking totally, instead of slowly cutting back how much you smoke over a period of time.  Going to in-person counseling. You are more likely to quit if you go to many counseling sessions.  Using resources and support systems, such as:  Online chats with a counselor.  Phone quitlines.  Printed self-help materials.  Support groups or group counseling.  Text messaging programs.  Mobile phone apps or applications.  Taking medicines. Some of these medicines may have nicotine in them. If you are pregnant or breastfeeding, do not take any medicines to quit smoking unless your doctor says it is okay. Talk with your doctor about counseling or other things that can help you. Talk with your doctor about using more than one strategy at the same time, such as taking medicines while you are also going to in-person counseling. This can help make quitting  easier. What things can I do to make it easier to quit? Quitting smoking might feel very hard at first, but there is a lot that you can do to make it easier. Take these steps:  Talk to your family and friends. Ask them to support and encourage you.  Call phone quitlines, reach out to support groups, or work with a counselor.  Ask people who smoke to not smoke around you.  Avoid places that make you want (trigger) to smoke, such as:  Bars.  Parties.  Smoke-break areas at work.  Spend time with people who do not smoke.  Lower the stress in your life. Stress can make you want to smoke. Try these things to help your stress:  Getting regular exercise.  Deep-breathing exercises.  Yoga.  Meditating.  Doing a body scan. To do this, close your eyes, focus on one area of your body at a time from head to toe, and notice which parts of your body are tense. Try to relax the muscles in those areas.  Download or buy apps on your mobile phone or tablet that can help you stick to your quit plan. There are many free apps, such as QuitGuide from the CDC (Centers for Disease Control and Prevention). You can find more support from smokefree.gov and other websites. This information is not intended to replace advice given to you by your health care provider. Make sure you discuss any questions you have with your health care provider. Document Released: 12/04/2008 Document Revised: 10/06/2015 Document   Reviewed: 06/24/2014 Elsevier Interactive Patient Education  2017 Elsevier Inc.  

## 2016-03-17 ENCOUNTER — Encounter (HOSPITAL_COMMUNITY): Payer: Self-pay | Admitting: Gastroenterology

## 2016-03-18 ENCOUNTER — Telehealth: Payer: Self-pay | Admitting: Gastroenterology

## 2016-03-18 NOTE — Telephone Encounter (Signed)
Please call pt. HE had ONE simple adenoma removed. FOLLOW A HIGH FIBER DIET. NEXT TCS IN 5-10 YEARS.

## 2016-03-21 NOTE — Telephone Encounter (Signed)
Reminder in epic °

## 2016-03-21 NOTE — Telephone Encounter (Signed)
Pt is aware.  

## 2016-03-28 NOTE — Progress Notes (Signed)
Urge him to continue to decrease alcohol cessation.

## 2016-03-28 NOTE — Progress Notes (Signed)
Please let patient know his labs indicate no genetic dictations for hereditary hemochromatosis. A viral markers for hepatitis B and C are both negative. No evidence of celiac disease. Iron studies and LFTs are now normal.

## 2016-03-28 NOTE — Progress Notes (Signed)
Pt is aware of results. 

## 2016-06-14 ENCOUNTER — Ambulatory Visit: Payer: Medicaid Other | Admitting: Orthopaedic Surgery

## 2016-06-15 ENCOUNTER — Ambulatory Visit (INDEPENDENT_AMBULATORY_CARE_PROVIDER_SITE_OTHER): Payer: Medicaid Other | Admitting: Orthopaedic Surgery

## 2016-06-15 ENCOUNTER — Encounter: Payer: Self-pay | Admitting: Orthopaedic Surgery

## 2016-06-15 VITALS — BP 131/83 | HR 83 | Temp 97.7°F | Resp 18 | Ht 68.0 in | Wt 141.0 lb

## 2016-06-15 DIAGNOSIS — F17210 Nicotine dependence, cigarettes, uncomplicated: Secondary | ICD-10-CM | POA: Diagnosis not present

## 2016-06-15 DIAGNOSIS — G8929 Other chronic pain: Secondary | ICD-10-CM

## 2016-06-15 DIAGNOSIS — M25511 Pain in right shoulder: Secondary | ICD-10-CM

## 2016-06-15 MED ORDER — HYDROCODONE-ACETAMINOPHEN 5-325 MG PO TABS
ORAL_TABLET | ORAL | 0 refills | Status: DC
Start: 2016-06-15 — End: 2016-09-13

## 2016-06-15 NOTE — Progress Notes (Signed)
Patient Shane Allison, male DOB:[birth year], 51 y.o. WEX:[id number]  Chief Complaint  Patient presents with  . Follow-up    Recheck right shoulder.    HPI  Shane Allison is a 51 y.o. male who has chronic pain of the right shoulder.  He has no paresthesias or new trauma. He is doing his exercises.  He has no swelling.  He continues to smoke and is not willing to stop. HPI  Body mass index is 21.44 kg/m.  ROS  Review of Systems  HENT: Negative for congestion.   Respiratory: Positive for cough and shortness of breath.   Cardiovascular: Negative for chest pain and leg swelling.  Endocrine: Negative for cold intolerance.  Musculoskeletal: Positive for arthralgias and myalgias.  Allergic/Immunologic: Positive for environmental allergies.    Past Medical History:  Diagnosis Date  . Alcohol use   . Asthma   . Cervical radiculopathy   . Chronic neck pain   . COPD (chronic obstructive pulmonary disease) ([place])   . GERD (gastroesophageal reflux disease)   . Hypertension   . Psoriasis   . Shortness of breath dyspnea     Past Surgical History:  Procedure Laterality Date  . BIOPSY  05/12/2015   Procedure: BIOPSY;  Surgeon: Danie Binder, MD;  [facility];  Service: Endoscopy;;  gastric biopsy  . BIOPSY  03/15/2016   Procedure: BIOPSY;  Surgeon: Danie Binder, MD;  [facility];  Service: Endoscopy;;  random colon bx's  . COLONOSCOPY WITH PROPOFOL N/A 03/15/2016   Procedure: COLONOSCOPY WITH PROPOFOL;  Surgeon: Danie Binder, MD;  [facility];  Service: Endoscopy;  Laterality: N/A;  12:30 PM  . ESOPHAGOGASTRODUODENOSCOPY (EGD) WITH PROPOFOL N/A 05/12/2015   Dr. Oneida Alar: normal esophagus, gastritis, bleeding friable duodenal mucosa  . HAND TENDON SURGERY Right   . POLYPECTOMY  03/15/2016   Procedure: POLYPECTOMY;  Surgeon: Danie Binder, MD;  [facility];  Service: Endoscopy;;  rectal polyp  . THUMB AMPUTATION     Partial  left thumb     Family History  Problem Relation Age of Onset  . Colon cancer Neg Hx     Social History Social History  Substance Use Topics  . Smoking status: Current Every Day Smoker    Packs/day: 1.00    Years: 40.00    Types: Cigarettes  . Smokeless tobacco: Never Used     Comment: 3 packs a day normally, but states "lately I'm down to 1" as of March 2017   . Alcohol use 0.0 oz/week     Comment: 6-12 pack beer at least 5 days a week     Allergies  Allergen Reactions  . Penicillins Rash    Has patient had a PCN reaction causing immediate rash, facial/tongue/throat swelling, SOB or lightheadedness with hypotension: Yes Has patient had a PCN reaction causing severe rash involving mucus membranes or skin necrosis: No Has patient had a PCN reaction that required hospitalization No Has patient had a PCN reaction occurring within the last 10 years: No If all of the above answers are "NO", then may proceed with Cephalosporin use.      Current Outpatient Prescriptions  Medication Sig Dispense Refill  . dicyclomine (BENTYL) 20 MG tablet 1 po 30 mins before breakfast and lunch and at bedtime 90 tablet 11  . HYDROcodone-acetaminophen (NORCO/VICODIN) 5-325 MG tablet One tablet by mouth every six hours as needed for pain.  Seven day limit per Medicaid guidelines. 28 tablet  0  . Ixekizumab (TALTZ) 80 MG/ML SOAJ Inject 80 m into the skin every 30 (thirty) days.     Marland Kitchen losartan (COZAAR) 50 MG tablet Take 50 mg by mouth daily.    . pantoprazole (PROTONIX) 40 MG tablet Take 1 tablet (40 mg total) by mouth 2 (two) times daily before a meal. 180 tablet 3   No current facility-administered medications for this visit.      Physical Exam  Blood pressure 131/83, pulse 83, temperature 97.7 F (36.5 C), resp. rate 18, height 5\' 8"  (1.727 m), weight 141 lb (64 kg).  Constitutional: overall normal hygiene, normal nutrition, well developed, normal grooming, normal body habitus. Assistive  device:none  Musculoskeletal: gait and station Limp none, muscle tone and strength are normal, no tremors or atrophy is present.  .  Neurological: coordination overall normal.  Deep tendon reflex/nerve stretch intact.  Sensation normal.  Cranial nerves II-XII intact.   Skin:   Normal overall no scars, lesions, ulcers or rashes. No psoriasis.  Psychiatric: Alert and oriented x 3.  Recent memory intact, remote memory unclear.  Normal mood and affect. Well groomed.  Good eye contact.  Cardiovascular: overall no swelling, no varicosities, no edema bilaterally, normal temperatures of the legs and arms, no clubbing, cyanosis and good capillary refill.  Lymphatic: palpation is normal.  Examination of right Upper Extremity is done.  Inspection:   Overall:  Elbow non-tender without crepitus or defects, forearm non-tender without crepitus or defects, wrist non-tender without crepitus or defects, hand non-tender.    Shoulder: with glenohumeral joint tenderness, without effusion.   Upper arm: without swelling and tenderness   Range of motion:   Overall:  Full range of motion of the elbow, full range of motion of wrist and full range of motion in fingers.   Shoulder:  right  145 degrees forward flexion; 120 degrees abduction; 35 degrees internal rotation, 35 degrees external rotation, 15 degrees extension, 40 degrees adduction.   Stability:   Overall:  Shoulder, elbow and wrist stable   Strength and Tone:   Overall full shoulder muscles strength, full upper arm strength and normal upper arm bulk and tone.   The patient has been educated about the nature of the problem(s) and counseled on treatment options.  The patient appeared to understand what I have discussed and is in agreement with it.  Encounter Diagnoses  Name Primary?  . Chronic right shoulder pain Yes  . Cigarette nicotine dependence without complication     PLAN Call if any problems.  Precautions discussed.  Continue current  medications.   Return to clinic 3 months   I have reviewed the [place] web site prior to prescribing narcotic medicine for this patient.  Electronically Signed Sanjuana Kava, MD 4/25/20182:37 PM

## 2016-06-28 ENCOUNTER — Other Ambulatory Visit (HOSPITAL_COMMUNITY): Payer: Self-pay | Admitting: Pulmonary Disease

## 2016-06-28 DIAGNOSIS — J984 Other disorders of lung: Secondary | ICD-10-CM

## 2016-07-05 ENCOUNTER — Ambulatory Visit (HOSPITAL_COMMUNITY)
Admission: RE | Admit: 2016-07-05 | Discharge: 2016-07-05 | Disposition: A | Discharge status: Home / Self Care | Payer: Medicaid Other | Source: Ambulatory Visit | Attending: Pulmonary Disease | Admitting: Pulmonary Disease

## 2016-07-05 DIAGNOSIS — R911 Solitary pulmonary nodule: Secondary | ICD-10-CM | POA: Diagnosis not present

## 2016-07-05 DIAGNOSIS — J984 Other disorders of lung: Secondary | ICD-10-CM | POA: Diagnosis present

## 2016-07-05 DIAGNOSIS — J439 Emphysema, unspecified: Secondary | ICD-10-CM | POA: Diagnosis not present

## 2016-07-13 ENCOUNTER — Ambulatory Visit (INDEPENDENT_AMBULATORY_CARE_PROVIDER_SITE_OTHER): Payer: Self-pay | Admitting: Gastroenterology

## 2016-07-13 ENCOUNTER — Encounter: Payer: Self-pay | Admitting: Gastroenterology

## 2016-07-13 VITALS — BP 129/82 | HR 87 | Temp 98.2°F | Ht 67.0 in | Wt 140.0 lb

## 2016-07-13 DIAGNOSIS — R197 Diarrhea, unspecified: Secondary | ICD-10-CM

## 2016-07-13 DIAGNOSIS — K219 Gastro-esophageal reflux disease without esophagitis: Secondary | ICD-10-CM

## 2016-07-13 MED ORDER — HYOSCYAMINE SULFATE ER 0.375 MG PO TB12: 0.3750 mg | ORAL_TABLET | Freq: Two times a day (BID) | ORAL | 0 refills | Status: DC

## 2016-07-13 NOTE — Patient Instructions (Signed)
1. Please collect stool for testing. WE WILL NOT NEED 24 HOUR STOOL COLLECTION AT THIS TIME. WE WILL START WITH SINGLE SPECIMEN. YOU WILL NEED TO TAKE SPECIMEN TO [facility]. 2. Stop Bentyl. Start Levbid one twice a daily for diarrhea. DO NOT START UNTIL AFTER YOUR SPECIMEN HAS BEEN COLLECTED.

## 2016-07-13 NOTE — Assessment & Plan Note (Signed)
Continues to have daily postprandial watery stools. Workup thus far has been negative for celiac disease, microscopic colitis. No evidence of ulcerative colitis or Crohn's colitis. CT without contrast last year with normal-appearing pancreas. Cannot exclude chronic pancreatic exocrine insufficiency given chronic alcohol use. Further workup to include stool studies were infectious etiology, stool sodium, stool potassium, osmolality. Fecal elastase. Stop Bentyl. Start Levbid once a stool collection has been done.

## 2016-07-13 NOTE — Assessment & Plan Note (Signed)
Patient continues on Protonix twice a day. Has had severe heartburn at times resulting in vomiting. Advised to try to get back to once daily if tolerated but if he has refractory symptoms he will resume twice a day dosing. Reinforced anti-reflex measures. Encouraged further decreasing alcohol consumption.

## 2016-07-13 NOTE — Progress Notes (Signed)
Primary Care Physician: Sinda Du, MD  Primary Gastroenterologist:Sandi Oneida Alar, MD  Chief Complaint  Patient presents with  . Diarrhea    also pp f/u    HPI: Shane Allison is a 51 y.o. male here for four-month follow-up. He underwent a colonoscopy back in January for chronic diarrhea. Random colon biopsies were negative. He had a tubular adenoma removed. Plan for next colonoscopy in 5-10 years. He also had negative celiac markers. Patient also has a history of possible upper GI bleed in March 2017 in the setting of alcohol and NSAID use. EGD with bleeding friable duodenal mucosa, he had had profuse bleeding from theday prior to seeing melena. Chronic diarrhea usually postprandially. 5-6 stools a day. Bentyl without relief.  Patient states he has cut back on alcohol use. Does not drink daily but drinks 5-6 beers per day the days he drinks and that's at least several days per week. He has been on Bentyl 3 times daily but does not notice any benefit. Continues to have 4-5 postprandial watery stools daily. No nocturnal diarrhea. No blood in the stool or melena. No obvious fat in the stool. Denies significant abdominal pain. He has not been able to get off of pantoprazole twice daily, with missed second dose of the day he's had some recurrent heartburn. He was advised to try to decrease to once daily if tolerated, he voiced understanding.   Current Outpatient Prescriptions  Medication Sig Dispense Refill  . dicyclomine (BENTYL) 20 MG tablet 1 po 30 mins before breakfast and lunch and at bedtime 90 tablet 11  . HYDROcodone-acetaminophen (NORCO/VICODIN) 5-325 MG tablet One tablet by mouth every six hours as needed for pain.  Seven day limit per Medicaid guidelines. 28 tablet 0  . losartan (COZAAR) 50 MG tablet Take 50 mg by mouth daily.    . pantoprazole (PROTONIX) 40 MG tablet Take 1 tablet (40 mg total) by mouth 2 (two) times daily before a meal. 180 tablet 3   No current  facility-administered medications for this visit.     Allergies as of 07/13/2016 - Review Complete 07/13/2016  Allergen Reaction Noted  . Penicillins Rash 09/25/2010    ROS:  General: Negative for anorexia, weight loss, fever, chills, fatigue, weakness. ENT: Negative for hoarseness, difficulty swallowing , nasal congestion. CV: Negative for chest pain, angina, palpitations, dyspnea on exertion, peripheral edema.  Respiratory: Negative for dyspnea at rest, dyspnea on exertion,  sputum, wheezing. Positive cough, followed by Dr. Luan Pulling GI: See history of present illness. GU:  Negative for dysuria, hematuria, urinary incontinence, urinary frequency, nocturnal urination.  Endo: Negative for unusual weight change.    Physical Examination:   BP 129/82   Pulse 87   Temp 98.2 F (36.8 C) (Oral)   Ht 5\' 7"  (1.702 m)   Wt 140 lb (63.5 kg)   BMI 21.93 kg/m   General: Well-nourished, well-developed in no acute distress.  Eyes: No icterus. Mouth: Oropharyngeal mucosa moist and pink , no lesions erythema or exudate. Lungs: Clear to auscultation bilaterally.  Heart: Regular rate and rhythm, no murmurs rubs or gallops.  Abdomen: Bowel sounds are normal, nontender, nondistended, no hepatosplenomegaly or masses, no abdominal bruits or hernia , no rebound or guarding.   Extremities: No lower extremity edema. No clubbing or deformities. Neuro: Alert and oriented x 4   Skin: Warm and dry, no jaundice.   Psych: Alert and cooperative, normal mood and affect.  Labs:  Lab Results  Component Value Date  WBC 6.5 03/08/2016   HGB 13.5 03/08/2016   HCT 40.4 03/08/2016   MCV 94.0 03/08/2016   PLT 283 03/08/2016   Lab Results  Component Value Date   ALT 22 03/03/2016   AST 25 03/03/2016   ALKPHOS 72 03/03/2016   BILITOT 0.4 03/03/2016   Lab Results  Component Value Date   IRON 105 03/03/2016   TIBC 378 03/03/2016   FERRITIN 70 03/03/2016    Imaging Studies: Ct Chest Wo  Contrast  Result Date: 07/06/2016 CLINICAL DATA:  Shortness of breath over the past 2 months, history of tobacco use EXAM: CT CHEST WITHOUT CONTRAST TECHNIQUE: Multidetector CT imaging of the chest was performed following the standard protocol without IV contrast. COMPARISON:  None. FINDINGS: Cardiovascular: Limited by the lack of IV contrast. Mild aortic calcifications are seen. The ascending aorta measures 3.7 cm but tapers in a normal fashion. The cardiac structures do not appear in large. Mild coronary calcifications are seen. Mediastinum/Nodes: Thoracic inlet is within normal limits. No hilar or mediastinal adenopathy is identified. Lungs/Pleura: Lungs are well aerated bilaterally and demonstrate some mild emphysematous changes. No definitive infiltrate or sizable effusion is noted. A small 5 mm nodular density is noted in the left apex. No other nodular changes are seen. Upper Abdomen: No acute abnormality noted. Musculoskeletal: Degenerative changes of the thoracic spine are seen. IMPRESSION: 5 mm left apical nodule. No follow-up needed if patient is low-risk. Non-contrast chest CT can be considered in 12 months if patient is high-risk. This recommendation follows the consensus statement: Guidelines for Management of Incidental Pulmonary Nodules Detected on CT Images: From the Fleischner Society 2017; Radiology 2017; 284:228-243. Mild emphysematous changes No other focal abnormality is noted. Electronically Signed   By: Inez Catalina M.D.   On: 07/06/2016 08:10

## 2016-07-13 NOTE — Progress Notes (Signed)
cc'ed to pcp °

## 2016-07-17 LAB — CLOSTRIDIUM DIFFICILE BY PCR: Toxigenic C. Difficile by PCR: NEGATIVE

## 2016-07-28 LAB — PANCREATIC ELASTASE, FECAL

## 2016-07-28 LAB — SODIUM, STOOL

## 2016-07-28 LAB — POTASSIUM, STOOL: Potassium, Stl: 20 mmol/L

## 2016-07-28 LAB — OSMOLALITY, STOOL: Osmolality,Stl: 415 mOsmol/kg

## 2016-08-03 NOTE — Progress Notes (Signed)
I'm not sure why but pancreatic elastase was cancelled, the GI pathogen panel wasn't done, stool sodium cancelled.   Cdiff neg. Osmolality suggests osmotic diarrhea, such as carbohydrate intolerance.   Please ask patient to avoid dairy as initial step. No ice cream, cheese, milk. Can use lactaid milk if desired.  Make sure not inadvertently taking laxatives.  Keep stool diary, document number of stools daily and consistency of stools.  Return to see [facility] first available/nonurgent.

## 2016-08-04 NOTE — Progress Notes (Signed)
PT is aware. Not taking any laxatives. Forwarding to Manuela Schwartz to schedule appt with Dr. Oneida Alar.

## 2016-08-17 ENCOUNTER — Encounter: Payer: Self-pay | Admitting: Gastroenterology

## 2016-09-13 ENCOUNTER — Ambulatory Visit (INDEPENDENT_AMBULATORY_CARE_PROVIDER_SITE_OTHER): Payer: Medicaid Other | Admitting: Orthopaedic Surgery

## 2016-09-13 ENCOUNTER — Encounter: Payer: Self-pay | Admitting: Orthopaedic Surgery

## 2016-09-13 VITALS — BP 157/90 | HR 75 | Temp 98.2°F | Ht 67.0 in | Wt 134.0 lb

## 2016-09-13 DIAGNOSIS — F17210 Nicotine dependence, cigarettes, uncomplicated: Secondary | ICD-10-CM | POA: Diagnosis not present

## 2016-09-13 DIAGNOSIS — G8929 Other chronic pain: Secondary | ICD-10-CM

## 2016-09-13 DIAGNOSIS — M25511 Pain in right shoulder: Secondary | ICD-10-CM

## 2016-09-13 MED ORDER — HYDROCODONE-ACETAMINOPHEN 5-325 MG PO TABS: ORAL_TABLET | ORAL | 0 refills | Status: DC

## 2016-09-13 NOTE — Progress Notes (Signed)
Patient Shane Allison, male DOB:[birth year], 51 y.o. DQQ:[id number]  Chief Complaint  Patient presents with  . Follow-up    Right shoulder pain    HPI  Shane Allison is a 51 y.o. male who has chronic right shoulder pain.  He has no new trauma, no numbness, no redness.  He is doing his exercises. HPI  Body mass index is 20.99 kg/m.  ROS  Review of Systems  HENT: Negative for congestion.   Respiratory: Positive for cough and shortness of breath.   Cardiovascular: Negative for chest pain and leg swelling.  Endocrine: Negative for cold intolerance.  Musculoskeletal: Positive for arthralgias and myalgias.  Allergic/Immunologic: Positive for environmental allergies.    Past Medical History:  Diagnosis Date  . Alcohol use   . Asthma   . Cervical radiculopathy   . Chronic neck pain   . COPD (chronic obstructive pulmonary disease) ([place])   . GERD (gastroesophageal reflux disease)   . Hypertension   . Psoriasis   . Shortness of breath dyspnea     Past Surgical History:  Procedure Laterality Date  . BIOPSY  05/12/2015   Procedure: BIOPSY;  Surgeon: Danie Binder, MD;  [facility];  Service: Endoscopy;;  gastric biopsy  . BIOPSY  03/15/2016   Procedure: BIOPSY;  Surgeon: Danie Binder, MD;  [facility];  Service: Endoscopy;;  random colon bx's  . COLONOSCOPY WITH PROPOFOL N/A 03/15/2016   Procedure: COLONOSCOPY WITH PROPOFOL;  Surgeon: Danie Binder, MD;  [facility];  Service: Endoscopy;  Laterality: N/A;  12:30 PM  . ESOPHAGOGASTRODUODENOSCOPY (EGD) WITH PROPOFOL N/A 05/12/2015   Dr. Oneida Alar: normal esophagus, gastritis, bleeding friable duodenal mucosa  . HAND TENDON SURGERY Right   . POLYPECTOMY  03/15/2016   Procedure: POLYPECTOMY;  Surgeon: Danie Binder, MD;  [facility];  Service: Endoscopy;;  rectal polyp  . THUMB AMPUTATION     Partial left thumb     Family History  Problem Relation Age of Onset  . Colon  cancer Neg Hx     Social History Social History  Substance Use Topics  . Smoking status: Current Every Day Smoker    Packs/day: 1.00    Years: 40.00    Types: Cigarettes  . Smokeless tobacco: Never Used     Comment: 3 packs a day normally, but states "lately I'm down to 1" as of March 2017   . Alcohol use 0.0 oz/week     Comment: PATIENT REPORT 6 PACK OF BEER SEVERAL DAYS PER WEEK BUT NO EVERY DAY. (07/13/16)    Allergies  Allergen Reactions  . Penicillins Rash    Has patient had a PCN reaction causing immediate rash, facial/tongue/throat swelling, SOB or lightheadedness with hypotension: Yes Has patient had a PCN reaction causing severe rash involving mucus membranes or skin necrosis: No Has patient had a PCN reaction that required hospitalization No Has patient had a PCN reaction occurring within the last 10 years: No If all of the above answers are "NO", then may proceed with Cephalosporin use.      Current Outpatient Prescriptions  Medication Sig Dispense Refill  . HYDROcodone-acetaminophen (NORCO/VICODIN) 5-325 MG tablet One tablet by mouth every six hours as needed for pain.  Seven day limit per Medicaid guidelines. 28 tablet 0  . hyoscyamine (LEVBID) 0.375 MG 12 hr tablet Take 1 tablet (0.375 mg total) by mouth 2 (two) times daily. 60 tablet 0  . losartan (COZAAR) 50 MG  tablet Take 50 mg by mouth daily.    . pantoprazole (PROTONIX) 40 MG tablet Take 1 tablet (40 mg total) by mouth 2 (two) times daily before a meal. 180 tablet 3   No current facility-administered medications for this visit.      Physical Exam  Blood pressure (!) 157/90, pulse 75, temperature 98.2 F (36.8 C), height 5\' 7"  (1.702 m), weight 134 lb (60.8 kg).  Constitutional: overall normal hygiene, normal nutrition, well developed, normal grooming, normal body habitus. Assistive device:none  Musculoskeletal: gait and station Limp none, muscle tone and strength are normal, no tremors or atrophy is  present.  .  Neurological: coordination overall normal.  Deep tendon reflex/nerve stretch intact.  Sensation normal.  Cranial nerves II-XII intact.   Skin:   Normal overall no scars, lesions, ulcers or rashes. No psoriasis.  Psychiatric: Alert and oriented x 3.  Recent memory intact, remote memory unclear.  Normal mood and affect. Well groomed.  Good eye contact.  Cardiovascular: overall no swelling, no varicosities, no edema bilaterally, normal temperatures of the legs and arms, no clubbing, cyanosis and good capillary refill.  Lymphatic: palpation is normal.  Examination of right Upper Extremity is done.  Inspection:   Overall:  Elbow non-tender without crepitus or defects, forearm non-tender without crepitus or defects, wrist non-tender without crepitus or defects, hand non-tender.    Shoulder: with glenohumeral joint tenderness, without effusion.   Upper arm: without swelling and tenderness   Range of motion:   Overall:  Full range of motion of the elbow, full range of motion of wrist and full range of motion in fingers.   Shoulder:  right  165 degrees forward flexion; 150 degrees abduction; 40 degrees internal rotation, 40 degrees external rotation, 15 degrees extension, 40 degrees adduction.   Stability:   Overall:  Shoulder, elbow and wrist stable   Strength and Tone:   Overall full shoulder muscles strength, full upper arm strength and normal upper arm bulk and tone.   The patient has been educated about the nature of the problem(s) and counseled on treatment options.  The patient appeared to understand what I have discussed and is in agreement with it.  Encounter Diagnoses  Name Primary?  . Chronic right shoulder pain Yes  . Cigarette nicotine dependence without complication     PLAN Call if any problems.  Precautions discussed.  Continue current medications.   Return to clinic 3 months   I have reviewed the [place] web  site prior to prescribing narcotic medicine for this patient. Electronically Signed Sanjuana Kava, MD 7/24/20182:46 PM

## 2016-09-13 NOTE — Patient Instructions (Addendum)
Steps to Quit Smoking Smoking tobacco can be bad for your health. It can also affect almost every organ in your body. Smoking puts you and people around you at risk for many serious Shane Allison-lasting (chronic) diseases. Quitting smoking is hard, but it is one of the best things that you can do for your health. It is never too late to quit. What are the benefits of quitting smoking? When you quit smoking, you lower your risk for getting serious diseases and conditions. They can include:  Lung cancer or lung disease.  Heart disease.  Stroke.  Heart attack.  Not being able to have children (infertility).  Weak bones (osteoporosis) and broken bones (fractures).  If you have coughing, wheezing, and shortness of breath, those symptoms may get better when you quit. You may also get sick less often. If you are pregnant, quitting smoking can help to lower your chances of having a baby of low birth weight. What can I do to help me quit smoking? Talk with your doctor about what can help you quit smoking. Some things you can do (strategies) include:  Quitting smoking totally, instead of slowly cutting back how much you smoke over a period of time.  Going to in-person counseling. You are more likely to quit if you go to many counseling sessions.  Using resources and support systems, such as: ? Online chats with a counselor. ? Phone quitlines. ? Printed self-help materials. ? Support groups or group counseling. ? Text messaging programs. ? Mobile phone apps or applications.  Taking medicines. Some of these medicines may have nicotine in them. If you are pregnant or breastfeeding, do not take any medicines to quit smoking unless your doctor says it is okay. Talk with your doctor about counseling or other things that can help you.  Talk with your doctor about using more than one strategy at the same time, such as taking medicines while you are also going to in-person counseling. This can help make  quitting easier. What things can I do to make it easier to quit? Quitting smoking might feel very hard at first, but there is a lot that you can do to make it easier. Take these steps:  Talk to your family and friends. Ask them to support and encourage you.  Call phone quitlines, reach out to support groups, or work with a counselor.  Ask people who smoke to not smoke around you.  Avoid places that make you want (trigger) to smoke, such as: ? Bars. ? Parties. ? Smoke-break areas at work.  Spend time with people who do not smoke.  Lower the stress in your life. Stress can make you want to smoke. Try these things to help your stress: ? Getting regular exercise. ? Deep-breathing exercises. ? Yoga. ? Meditating. ? Doing a body scan. To do this, close your eyes, focus on one area of your body at a time from head to toe, and notice which parts of your body are tense. Try to relax the muscles in those areas.  Download or buy apps on your mobile phone or tablet that can help you stick to your quit plan. There are many free apps, such as QuitGuide from the CDC (Centers for Disease Control and Prevention). You can find more support from smokefree.gov and other websites.  This information is not intended to replace advice given to you by your health care provider. Make sure you discuss any questions you have with your health care provider. Document Released: 12/04/2008 Document   Revised: 10/06/2015 Document Reviewed: 06/24/2014 Elsevier Interactive Patient Education  2018 Elsevier Inc.  

## 2016-09-14 ENCOUNTER — Ambulatory Visit: Payer: Medicaid Other | Admitting: Orthopaedic Surgery

## 2016-10-07 NOTE — Progress Notes (Signed)
REVIEWED-NO ADDITIONAL RECOMMENDATIONS. 

## 2016-10-26 ENCOUNTER — Other Ambulatory Visit (HOSPITAL_COMMUNITY): Payer: Self-pay | Admitting: Respiratory Therapy

## 2016-10-26 DIAGNOSIS — J441 Chronic obstructive pulmonary disease with (acute) exacerbation: Secondary | ICD-10-CM

## 2016-11-02 ENCOUNTER — Ambulatory Visit (HOSPITAL_COMMUNITY)
Admission: RE | Admit: 2016-11-02 | Discharge: 2016-11-02 | Disposition: A | Discharge status: Home / Self Care | Payer: Medicaid Other | Source: Ambulatory Visit | Attending: Pulmonary Disease | Admitting: Pulmonary Disease

## 2016-11-02 DIAGNOSIS — R942 Abnormal results of pulmonary function studies: Secondary | ICD-10-CM | POA: Insufficient documentation

## 2016-11-02 DIAGNOSIS — J441 Chronic obstructive pulmonary disease with (acute) exacerbation: Secondary | ICD-10-CM | POA: Diagnosis not present

## 2016-11-02 LAB — PULMONARY FUNCTION TEST
DL/VA % pred: 80 %
DL/VA: 3.58 ml/min/mmHg/L
DLCO cor % pred: 67 %
DLCO cor: 19.03 ml/min/mmHg
DLCO unc % pred: 67 %
DLCO unc: 19.03 ml/min/mmHg
FEF 25-75 Post: 1.28 L/sec
FEF 25-75 Pre: 1.65 L/sec
FEF2575-%Change-Post: -22 %
FEF2575-%Pred-Post: 39 %
FEF2575-%Pred-Pre: 50 %
FEV1-%Change-Post: -6 %
FEV1-%Pred-Post: 71 %
FEV1-%Pred-Pre: 75 %
FEV1-Post: 2.54 L
FEV1-Pre: 2.71 L
FEV1FVC-%Change-Post: -1 %
FEV1FVC-%Pred-Pre: 82 %
FEV6-%Change-Post: -4 %
FEV6-%Pred-Post: 89 %
FEV6-%Pred-Pre: 94 %
FEV6-Post: 3.97 L
FEV6-Pre: 4.17 L
FEV6FVC-%Change-Post: 0 %
FEV6FVC-%Pred-Post: 103 %
FEV6FVC-%Pred-Pre: 103 %
FVC-%Change-Post: -4 %
FVC-%Pred-Post: 86 %
FVC-%Pred-Pre: 91 %
FVC-Post: 3.99 L
FVC-Pre: 4.2 L
Post FEV1/FVC ratio: 64 %
Post FEV6/FVC ratio: 100 %
Pre FEV1/FVC ratio: 65 %
Pre FEV6/FVC Ratio: 99 %
RV % pred: 228 %
RV: 4.23 L
TLC % pred: 114 %
TLC: 7.27 L

## 2016-11-02 MED ORDER — ALBUTEROL SULFATE (2.5 MG/3ML) 0.083% IN NEBU
2.5000 mg | INHALATION_SOLUTION | Freq: Once | RESPIRATORY_TRACT | Status: AC
  Administered 2016-11-02: 2.5 mg via RESPIRATORY_TRACT

## 2016-12-14 ENCOUNTER — Ambulatory Visit (INDEPENDENT_AMBULATORY_CARE_PROVIDER_SITE_OTHER): Payer: Medicaid Other | Admitting: Orthopaedic Surgery

## 2016-12-14 ENCOUNTER — Encounter: Payer: Self-pay | Admitting: Orthopaedic Surgery

## 2016-12-14 VITALS — BP 140/86 | HR 86 | Temp 97.9°F | Ht 67.0 in | Wt 141.0 lb

## 2016-12-14 DIAGNOSIS — G5603 Carpal tunnel syndrome, bilateral upper limbs: Secondary | ICD-10-CM

## 2016-12-14 DIAGNOSIS — I7300 Raynaud's syndrome without gangrene: Secondary | ICD-10-CM

## 2016-12-14 DIAGNOSIS — G8929 Other chronic pain: Secondary | ICD-10-CM

## 2016-12-14 DIAGNOSIS — J449 Chronic obstructive pulmonary disease, unspecified: Secondary | ICD-10-CM

## 2016-12-14 DIAGNOSIS — M25511 Pain in right shoulder: Secondary | ICD-10-CM

## 2016-12-14 DIAGNOSIS — F17210 Nicotine dependence, cigarettes, uncomplicated: Secondary | ICD-10-CM | POA: Diagnosis not present

## 2016-12-14 MED ORDER — HYDROCODONE-ACETAMINOPHEN 5-325 MG PO TABS: ORAL_TABLET | ORAL | 0 refills | Status: DC

## 2016-12-14 NOTE — Progress Notes (Signed)
Patient ZO:XWRUEAV Shane Allison, male DOB:Nov 30, 1965, 51 y.o. WUJ:[id number]  Chief Complaint  Patient presents with  . Shoulder Pain    right    HPI  Shane Allison is a 51 y.o. male who has chronic right shoulder pain.  He has no new trauma.  He has pain with overhead use.  He has no redness.  He has numbness of both hands, more at night in median nerve distribution.  He has no trauma.  He has color changes of the fingers in cold. HPI  Body mass index is 22.08 kg/m.  ROS  Review of Systems  HENT: Negative for congestion.   Respiratory: Positive for cough and shortness of breath.   Cardiovascular: Negative for chest pain and leg swelling.  Endocrine: Negative for cold intolerance.  Musculoskeletal: Positive for arthralgias and myalgias.  Allergic/Immunologic: Positive for environmental allergies.    Past Medical History:  Diagnosis Date  . Alcohol use   . Asthma   . Cervical radiculopathy   . Chronic neck pain   . COPD (chronic obstructive pulmonary disease) ([place])   . GERD (gastroesophageal reflux disease)   . Hypertension   . Psoriasis   . Shortness of breath dyspnea     Past Surgical History:  Procedure Laterality Date  . BIOPSY  05/12/2015   Procedure: BIOPSY;  Surgeon: Danie Binder, MD;  [facility];  Service: Endoscopy;;  gastric biopsy  . BIOPSY  03/15/2016   Procedure: BIOPSY;  Surgeon: Danie Binder, MD;  [facility];  Service: Endoscopy;;  random colon bx's  . COLONOSCOPY WITH PROPOFOL N/A 03/15/2016   Procedure: COLONOSCOPY WITH PROPOFOL;  Surgeon: Danie Binder, MD;  [facility];  Service: Endoscopy;  Laterality: N/A;  12:30 PM  . ESOPHAGOGASTRODUODENOSCOPY (EGD) WITH PROPOFOL N/A 05/12/2015   Dr. Oneida Alar: normal esophagus, gastritis, bleeding friable duodenal mucosa  . HAND TENDON SURGERY Right   . POLYPECTOMY  03/15/2016   Procedure: POLYPECTOMY;  Surgeon: Danie Binder, MD;  [facility];  Service:  Endoscopy;;  rectal polyp  . THUMB AMPUTATION     Partial left thumb     Family History  Problem Relation Age of Onset  . Colon cancer Neg Hx     Social History Social History  Substance Use Topics  . Smoking status: Current Every Day Smoker    Packs/day: 1.00    Years: 40.00    Types: Cigarettes  . Smokeless tobacco: Never Used     Comment: 3 packs a day normally, but states "lately I'm down to 1" as of March 2017   . Alcohol use 0.0 oz/week     Comment: PATIENT REPORT 6 PACK OF BEER SEVERAL DAYS PER WEEK BUT NO EVERY DAY. (07/13/16)    Allergies  Allergen Reactions  . Penicillins Rash    Has patient had a PCN reaction causing immediate rash, facial/tongue/throat swelling, SOB or lightheadedness with hypotension: Yes Has patient had a PCN reaction causing severe rash involving mucus membranes or skin necrosis: No Has patient had a PCN reaction that required hospitalization No Has patient had a PCN reaction occurring within the last 10 years: No If all of the above answers are "NO", then may proceed with Cephalosporin use.      Current Outpatient Prescriptions  Medication Sig Dispense Refill  . HYDROcodone-acetaminophen (NORCO/VICODIN) 5-325 MG tablet One tablet by mouth every six hours as needed for pain.  Seven day limit per Medicaid guidelines. 28 tablet 0  .  hyoscyamine (LEVBID) 0.375 MG 12 hr tablet Take 1 tablet (0.375 mg total) by mouth 2 (two) times daily. 60 tablet 0  . losartan (COZAAR) 50 MG tablet Take 50 mg by mouth daily.    . pantoprazole (PROTONIX) 40 MG tablet Take 1 tablet (40 mg total) by mouth 2 (two) times daily before a meal. 180 tablet 3   No current facility-administered medications for this visit.      Physical Exam  Blood pressure 140/86, pulse 86, temperature 97.9 F (36.6 C), height 5\' 7"  (1.702 m), weight 141 lb (64 kg).  Constitutional: overall normal hygiene, normal nutrition, well developed, normal grooming, normal body  habitus. Assistive device:none  Musculoskeletal: gait and station Limp none, muscle tone and strength are normal, no tremors or atrophy is present.  .  Neurological: coordination overall normal.  Deep tendon reflex/nerve stretch intact.  Sensation normal.  Cranial nerves II-XII intact.   Skin:   Normal overall no scars, lesions, ulcers or rashes. No psoriasis.  Psychiatric: Alert and oriented x 3.  Recent memory intact, remote memory unclear.  Normal mood and affect. Well groomed.  Good eye contact.  Cardiovascular: overall no swelling, no varicosities, no edema bilaterally, normal temperatures of the legs and arms, no clubbing, cyanosis and good capillary refill.  Lymphatic: palpation is normal.  All other systems reviewed and are negative   Both hands have positive Phalen and Tinels sign and decreased sensation in the median nerve distribution.  ROM is full.  Examination of right Upper Extremity is done.  Inspection:   Overall:  Elbow non-tender without crepitus or defects, forearm non-tender without crepitus or defects, wrist non-tender without crepitus or defects, hand non-tender.    Shoulder: with glenohumeral joint tenderness, without effusion.   Upper arm: without swelling and tenderness   Range of motion:   Overall:  Full range of motion of the elbow, full range of motion of wrist and full range of motion in fingers.   Shoulder:  right  180 degrees forward flexion; 165 degrees abduction; 40 degrees internal rotation, 40 degrees external rotation, 15 degrees extension, 40 degrees adduction.   Stability:   Overall:  Shoulder, elbow and wrist stable   Strength and Tone:   Overall full shoulder muscles strength, full upper arm strength and normal upper arm bulk and tone.  The patient has been educated about the nature of the problem(s) and counseled on treatment options.  The patient appeared to understand what I have discussed and is in agreement with it.  Encounter  Diagnoses  Name Primary?  . Chronic right shoulder pain Yes  . Bilateral carpal tunnel syndrome   . Cigarette nicotine dependence without complication   . Chronic obstructive pulmonary disease, unspecified COPD type ([place])   . Raynaud's phenomenon without gangrene     PLAN Call if any problems.  Precautions discussed.  Continue current medications.   Return to clinic after obtains EMGs of hands   Electronically Signed Sanjuana Kava, MD 10/24/20181:47 PM

## 2016-12-14 NOTE — Patient Instructions (Signed)

## 2017-01-10 ENCOUNTER — Ambulatory Visit: Payer: Medicaid Other | Admitting: Orthopaedic Surgery

## 2017-01-10 ENCOUNTER — Encounter: Payer: Self-pay | Admitting: Orthopaedic Surgery

## 2017-01-10 VITALS — BP 140/91 | HR 95 | Temp 98.6°F | Ht 67.0 in | Wt 141.0 lb

## 2017-01-10 DIAGNOSIS — I7300 Raynaud's syndrome without gangrene: Secondary | ICD-10-CM

## 2017-01-10 DIAGNOSIS — M25511 Pain in right shoulder: Secondary | ICD-10-CM | POA: Diagnosis not present

## 2017-01-10 DIAGNOSIS — F17210 Nicotine dependence, cigarettes, uncomplicated: Secondary | ICD-10-CM | POA: Diagnosis not present

## 2017-01-10 DIAGNOSIS — G8929 Other chronic pain: Secondary | ICD-10-CM | POA: Diagnosis not present

## 2017-01-10 DIAGNOSIS — G5603 Carpal tunnel syndrome, bilateral upper limbs: Secondary | ICD-10-CM

## 2017-01-10 MED ORDER — HYDROCODONE-ACETAMINOPHEN 5-325 MG PO TABS: ORAL_TABLET | ORAL | 0 refills | Status: DC

## 2017-01-10 NOTE — Patient Instructions (Signed)
Steps to Quit Smoking Smoking tobacco can be bad for your health. It can also affect almost every organ in your body. Smoking puts you and people around you at risk for many serious Anja Neuzil-lasting (chronic) diseases. Quitting smoking is hard, but it is one of the best things that you can do for your health. It is never too late to quit. What are the benefits of quitting smoking? When you quit smoking, you lower your risk for getting serious diseases and conditions. They can include:  Lung cancer or lung disease.  Heart disease.  Stroke.  Heart attack.  Not being able to have children (infertility).  Weak bones (osteoporosis) and broken bones (fractures).  If you have coughing, wheezing, and shortness of breath, those symptoms may get better when you quit. You may also get sick less often. If you are pregnant, quitting smoking can help to lower your chances of having a baby of low birth weight. What can I do to help me quit smoking? Talk with your doctor about what can help you quit smoking. Some things you can do (strategies) include:  Quitting smoking totally, instead of slowly cutting back how much you smoke over a period of time.  Going to in-person counseling. You are more likely to quit if you go to many counseling sessions.  Using resources and support systems, such as: ? Online chats with a counselor. ? Phone quitlines. ? Printed self-help materials. ? Support groups or group counseling. ? Text messaging programs. ? Mobile phone apps or applications.  Taking medicines. Some of these medicines may have nicotine in them. If you are pregnant or breastfeeding, do not take any medicines to quit smoking unless your doctor says it is okay. Talk with your doctor about counseling or other things that can help you.  Talk with your doctor about using more than one strategy at the same time, such as taking medicines while you are also going to in-person counseling. This can help make  quitting easier. What things can I do to make it easier to quit? Quitting smoking might feel very hard at first, but there is a lot that you can do to make it easier. Take these steps:  Talk to your family and friends. Ask them to support and encourage you.  Call phone quitlines, reach out to support groups, or work with a counselor.  Ask people who smoke to not smoke around you.  Avoid places that make you want (trigger) to smoke, such as: ? Bars. ? Parties. ? Smoke-break areas at work.  Spend time with people who do not smoke.  Lower the stress in your life. Stress can make you want to smoke. Try these things to help your stress: ? Getting regular exercise. ? Deep-breathing exercises. ? Yoga. ? Meditating. ? Doing a body scan. To do this, close your eyes, focus on one area of your body at a time from head to toe, and notice which parts of your body are tense. Try to relax the muscles in those areas.  Download or buy apps on your mobile phone or tablet that can help you stick to your quit plan. There are many free apps, such as QuitGuide from the CDC (Centers for Disease Control and Prevention). You can find more support from smokefree.gov and other websites.  This information is not intended to replace advice given to you by your health care provider. Make sure you discuss any questions you have with your health care provider. Document Released: 12/04/2008 Document   Revised: 10/06/2015 Document Reviewed: 06/24/2014 Elsevier Interactive Patient Education  2018 Elsevier Inc.  

## 2017-01-10 NOTE — Progress Notes (Signed)
Patient Shane Allison, male DOB:[birth year], 51 y.o. STM:[id number]  Chief Complaint  Patient presents with  . Results    EMG    HPI  Shane Allison is a 51 y.o. male who has bilateral hand pain, more at night with numbness.  He had EMGs done and they show only slight tendency for left sided carpal tunnel, very early.  I have informed him of the findings.  I have recommended he wear gloves at night as the coldness makes his hand worse.  He continues to smoke and really needs to cut back and stop. HPI  Body mass index is 22.08 kg/m.  ROS  Review of Systems  Past Medical History:  Diagnosis Date  . Alcohol use   . Asthma   . Cervical radiculopathy   . Chronic neck pain   . COPD (chronic obstructive pulmonary disease) ([place])   . GERD (gastroesophageal reflux disease)   . Hypertension   . Psoriasis   . Shortness of breath dyspnea     Past Surgical History:  Procedure Laterality Date  . BIOPSY  05/12/2015   Procedure: BIOPSY;  Surgeon: Danie Binder, MD;  [facility];  Service: Endoscopy;;  gastric biopsy  . BIOPSY  03/15/2016   Procedure: BIOPSY;  Surgeon: Danie Binder, MD;  [facility];  Service: Endoscopy;;  random colon bx's  . COLONOSCOPY WITH PROPOFOL N/A 03/15/2016   Procedure: COLONOSCOPY WITH PROPOFOL;  Surgeon: Danie Binder, MD;  [facility];  Service: Endoscopy;  Laterality: N/A;  12:30 PM  . ESOPHAGOGASTRODUODENOSCOPY (EGD) WITH PROPOFOL N/A 05/12/2015   Dr. Oneida Alar: normal esophagus, gastritis, bleeding friable duodenal mucosa  . HAND TENDON SURGERY Right   . POLYPECTOMY  03/15/2016   Procedure: POLYPECTOMY;  Surgeon: Danie Binder, MD;  [facility];  Service: Endoscopy;;  rectal polyp  . THUMB AMPUTATION     Partial left thumb     Family History  Problem Relation Age of Onset  . Colon cancer Neg Hx     Social History Social History   Tobacco Use  . Smoking status: Current Every Day Smoker   Packs/day: 1.00    Years: 40.00    Pack years: 40.00    Types: Cigarettes  . Smokeless tobacco: Never Used  . Tobacco comment: 3 packs a day normally, but states "lately I'm down to 1" as of March 2017   Substance Use Topics  . Alcohol use: Yes    Alcohol/week: 0.0 oz    Comment: PATIENT REPORT 6 PACK OF BEER SEVERAL DAYS PER WEEK BUT NO EVERY DAY. (07/13/16)  . Drug use: No    Allergies  Allergen Reactions  . Penicillins Rash    Has patient had a PCN reaction causing immediate rash, facial/tongue/throat swelling, SOB or lightheadedness with hypotension: Yes Has patient had a PCN reaction causing severe rash involving mucus membranes or skin necrosis: No Has patient had a PCN reaction that required hospitalization No Has patient had a PCN reaction occurring within the last 10 years: No If all of the above answers are "NO", then may proceed with Cephalosporin use.      Current Outpatient Medications  Medication Sig Dispense Refill  . HYDROcodone-acetaminophen (NORCO/VICODIN) 5-325 MG tablet One tablet by mouth every six hours as needed for pain.  Seven day limit per Medicaid guidelines. 28 tablet 0  . hyoscyamine (LEVBID) 0.375 MG 12 hr tablet Take 1 tablet (0.375 mg total) by mouth 2 (two)  times daily. 60 tablet 0  . losartan (COZAAR) 50 MG tablet Take 50 mg by mouth daily.    . pantoprazole (PROTONIX) 40 MG tablet Take 1 tablet (40 mg total) by mouth 2 (two) times daily before a meal. 180 tablet 3   No current facility-administered medications for this visit.      Physical Exam  Blood pressure (!) 140/91, pulse 95, temperature 98.6 F (37 C), height 5\' 7"  (1.702 m), weight 141 lb (64 kg).  Constitutional: overall normal hygiene, normal nutrition, well developed, normal grooming, normal body habitus. Assistive device:none  Musculoskeletal: gait and station Limp none, muscle tone and strength are normal, no tremors or atrophy is present.  .  Neurological: coordination  overall normal.  Deep tendon reflex/nerve stretch intact.  Sensation normal.  Cranial nerves II-XII intact.   Skin:   Normal overall no scars, lesions, ulcers or rashes. No psoriasis.  Psychiatric: Alert and oriented x 3.  Recent memory intact, remote memory unclear.  Normal mood and affect. Well groomed.  Good eye contact.  Cardiovascular: overall no swelling, no varicosities, no edema bilaterally, normal temperatures of the legs and arms, no clubbing, cyanosis and good capillary refill.  Lymphatic: palpation is normal.  All other systems reviewed and are negative   Hands bilaterally have full ROM and normal sensation and color.   The patient has been educated about the nature of the problem(s) and counseled on treatment options.  The patient appeared to understand what I have discussed and is in agreement with it.  Encounter Diagnoses  Name Primary?  . Chronic right shoulder pain Yes  . Bilateral carpal tunnel syndrome   . Cigarette nicotine dependence without complication   . Raynaud's phenomenon without gangrene     PLAN Call if any problems.  Precautions discussed.  Continue current medications.   Return to clinic 6 weeks   I have reviewed the [place] web site prior to prescribing narcotic medicine for this patient.  Electronically [place], MD 11/20/20183:08 PM

## 2017-02-22 ENCOUNTER — Ambulatory Visit: Payer: Medicaid Other | Admitting: Orthopaedic Surgery

## 2017-03-21 ENCOUNTER — Telehealth: Payer: Self-pay | Admitting: Orthopaedic Surgery

## 2017-03-21 NOTE — Telephone Encounter (Signed)
Patient called wanting to schedule an appointment for a new problem, his back.  I told him that we would have to get a Medicaid referral for this problem.  He said that he would speak to Dr. Luan Pulling' office regarding this.  I checked his online Medicaid PCP and it showed [facility] as his PCP.   He said he has never been there.  I told him that he needs to call his social worker and let them know that the wrong PCP is listed for him.  He said he would call the Education officer, museum.  I told him that once this resolved, he could call me back and we would get him scheduled.  He said he would do that.

## 2017-03-22 ENCOUNTER — Telehealth: Payer: Self-pay | Admitting: Orthopaedic Surgery

## 2017-03-22 NOTE — Telephone Encounter (Signed)
Spoke to patient morning.  He has spoken to social work and Dr. Luan Pulling office for referral.  The social worker told him that they would change his PCP in their computer.  Dr. Luan Pulling office is going to send a referral.  He did admit that he is seeing a chiropractor today and he will have those notes sent to [place] also.  He will call back next week to have [place] check his Medicaid through our computer.  At that time, we will schedule him an appointment if PCP has been corrected  He understands and agrees with this.

## 2017-04-05 ENCOUNTER — Encounter: Payer: Self-pay | Admitting: Orthopaedic Surgery

## 2017-04-05 ENCOUNTER — Ambulatory Visit (INDEPENDENT_AMBULATORY_CARE_PROVIDER_SITE_OTHER): Payer: Medicaid Other

## 2017-04-05 ENCOUNTER — Ambulatory Visit: Payer: Medicaid Other | Admitting: Orthopaedic Surgery

## 2017-04-05 VITALS — BP 156/101 | HR 94 | Temp 97.6°F | Ht 67.0 in | Wt 142.0 lb

## 2017-04-05 DIAGNOSIS — G8929 Other chronic pain: Secondary | ICD-10-CM

## 2017-04-05 DIAGNOSIS — M5442 Lumbago with sciatica, left side: Secondary | ICD-10-CM | POA: Diagnosis not present

## 2017-04-05 DIAGNOSIS — F17200 Nicotine dependence, unspecified, uncomplicated: Secondary | ICD-10-CM | POA: Diagnosis not present

## 2017-04-05 DIAGNOSIS — J449 Chronic obstructive pulmonary disease, unspecified: Secondary | ICD-10-CM | POA: Diagnosis not present

## 2017-04-05 MED ORDER — PREDNISONE 5 MG (21) PO TBPK: ORAL_TABLET | ORAL | 0 refills | Status: DC

## 2017-04-05 MED ORDER — HYDROCODONE-ACETAMINOPHEN 5-325 MG PO TABS: ORAL_TABLET | ORAL | 0 refills | Status: DC

## 2017-04-05 NOTE — Progress Notes (Signed)
Patient Shane Allison, male DOB:1965-08-20, 52 y.o. ALP:[id number]  Chief Complaint  Patient presents with  . Back Pain    lower back     HPI  Shane Allison is a 52 y.o. male who has developed lower back pain with left sided sciatica over the last month.  He has seen Dr. Lovena Le the chiropractor for this and has had four treatments.    He has seen Dr. Luan Pulling for this.  I have reviewed the notes from each provider.  He has not had x-rays.  He has no trauma.  He has no weakness.  He is not better.  He has taken ibuprofen with no help.  He has no bowel or bladder problem.  Heat and ice have not helped. HPI  Body mass index is 22.24 kg/m.  ROS  Review of Systems  HENT: Negative for congestion.   Respiratory: Positive for cough and shortness of breath.   Cardiovascular: Negative for chest pain and leg swelling.  Endocrine: Negative for cold intolerance.  Musculoskeletal: Positive for arthralgias, back pain and myalgias.  Allergic/Immunologic: Positive for environmental allergies.  All other systems reviewed and are negative.   Past Medical History:  Diagnosis Date  . Alcohol use   . Asthma   . Cervical radiculopathy   . Chronic neck pain   . COPD (chronic obstructive pulmonary disease) ([place])   . GERD (gastroesophageal reflux disease)   . Hypertension   . Psoriasis   . Shortness of breath dyspnea     Past Surgical History:  Procedure Laterality Date  . BIOPSY  05/12/2015   Procedure: BIOPSY;  Surgeon: Danie Binder, MD;  Location: [facility];  Service: Endoscopy;;  gastric biopsy  . BIOPSY  03/15/2016   Procedure: BIOPSY;  Surgeon: Danie Binder, MD;  Location: [facility];  Service: Endoscopy;;  random colon bx's  . COLONOSCOPY WITH PROPOFOL N/A 03/15/2016   Procedure: COLONOSCOPY WITH PROPOFOL;  Surgeon: Danie Binder, MD;  Location: [facility];  Service: Endoscopy;  Laterality: N/A;  12:30 PM  . ESOPHAGOGASTRODUODENOSCOPY (EGD) WITH PROPOFOL  N/A 05/12/2015   Dr. Oneida Alar: normal esophagus, gastritis, bleeding friable duodenal mucosa  . HAND TENDON SURGERY Right   . POLYPECTOMY  03/15/2016   Procedure: POLYPECTOMY;  Surgeon: Danie Binder, MD;  Location: [facility];  Service: Endoscopy;;  rectal polyp  . THUMB AMPUTATION     Partial left thumb     Family History  Problem Relation Age of Onset  . Colon cancer Neg Hx     Social History Social History   Tobacco Use  . Smoking status: Current Every Day Smoker    Packs/day: 1.00    Years: 40.00    Pack years: 40.00    Types: Cigarettes  . Smokeless tobacco: Never Used  . Tobacco comment: 3 packs a day normally, but states "lately I'm down to 1" as of March 2017   Substance Use Topics  . Alcohol use: Yes    Alcohol/week: 0.0 oz    Comment: PATIENT REPORT 6 PACK OF BEER SEVERAL DAYS PER WEEK BUT NO EVERY DAY. (07/13/16)  . Drug use: No    Allergies  Allergen Reactions  . Penicillins Rash    Has patient had a PCN reaction causing immediate rash, facial/tongue/throat swelling, SOB or lightheadedness with hypotension: Yes Has patient had a PCN reaction causing severe rash involving mucus membranes or skin necrosis: No Has patient had a PCN reaction that required hospitalization No  Has patient had a PCN reaction occurring within the last 10 years: No If all of the above answers are "NO", then may proceed with Cephalosporin use.      Current Outpatient Medications  Medication Sig Dispense Refill  . hyoscyamine (LEVBID) 0.375 MG 12 hr tablet Take 1 tablet (0.375 mg total) by mouth 2 (two) times daily. 60 tablet 0  . lisinopril (PRINIVIL,ZESTRIL) 10 MG tablet Take by mouth.    . losartan (COZAAR) 50 MG tablet Take 50 mg by mouth daily.    . pantoprazole (PROTONIX) 40 MG tablet Take 1 tablet (40 mg total) by mouth 2 (two) times daily before a meal. 180 tablet 3  . TALTZ 80 MG/ML SOAJ INJECT THE CONTENTS OF 1 PEN INTO THE SKIN EVERY FOUR WEEKS STARTING WITH WEEK 12  9  .  HYDROcodone-acetaminophen (NORCO/VICODIN) 5-325 MG tablet One tablet every four hours as needed for acute pain.  Limit of five days per [place] statue. 30 tablet 0  . predniSONE (STERAPRED UNI-PAK 21 TAB) 5 MG (21) TBPK tablet Take 6 pills first day; 5 pills second day; 4 pills third day; 3 pills fourth day; 2 pills next day and 1 pill last day. 21 tablet 0   No current facility-administered medications for this visit.      Physical Exam  Blood pressure (!) 156/101, pulse 94, temperature 97.6 F (36.4 C), height 5\' 7"  (1.702 m), weight 142 lb (64.4 kg).  Constitutional: overall normal hygiene, normal nutrition, well developed, normal grooming, normal body habitus. Assistive device:none  Musculoskeletal: gait and station Limp none, muscle tone and strength are normal, no tremors or atrophy is present.  .  Neurological: coordination overall normal.  Deep tendon reflex/nerve stretch intact.  Sensation normal.  Cranial nerves II-XII intact.   Skin:   Normal overall no scars, lesions, ulcers or rashes. No psoriasis.  Psychiatric: Alert and oriented x 3.  Recent memory intact, remote memory unclear.  Normal mood and affect. Well groomed.  Good eye contact.  Cardiovascular: overall no swelling, no varicosities, no edema bilaterally, normal temperatures of the legs and arms, no clubbing, cyanosis and good capillary refill.  Lymphatic: palpation is normal.  Spine/Pelvis examination:  Inspection:  Overall, sacoiliac joint benign and hips nontender; without crepitus or defects.   Thoracic spine inspection: Alignment normal without kyphosis present   Lumbar spine inspection:  Alignment  with normal lumbar lordosis, without scoliosis apparent.   Thoracic spine palpation:  without tenderness of spinal processes   Lumbar spine palpation: with tenderness of lumbar area; with tightness of lumbar muscles    Range of Motion:   Lumbar flexion, forward flexion is 20 with pain or  tenderness    Lumbar extension is 10 with pain or tenderness   Left lateral bend is Abnormal- 5  with pain or tenderness   Right lateral bend is Abnormal- 5 with pain or tenderness   Straight leg raising is Normal   Strength & tone: Normal   Stability overall normal stability    X-rays of the lumbar spine were done.  Reported separately.  All other systems reviewed and are negative   The patient has been educated about the nature of the problem(s) and counseled on treatment options.  The patient appeared to understand what I have discussed and is in agreement with it.  Encounter Diagnoses  Name Primary?  . Chronic left-sided low back pain with left-sided sciatica Yes  . Chronic obstructive pulmonary disease, unspecified COPD type ([place])   .  Tobacco smoker within last 12 months     PLAN Call if any problems.  Precautions discussed.  Prednisone dose pack given as well as pain medicine.  Return to clinic 1 week   I have reviewed the [place] web site prior to prescribing narcotic medicine for this patient.  Electronically Signed Sanjuana Kava, MD 2/13/20199:44 AM

## 2017-04-05 NOTE — Patient Instructions (Addendum)
Steps to Quit Smoking Smoking tobacco can be bad for your health. It can also affect almost every organ in your body. Smoking puts you and people around you at risk for many serious long-lasting (chronic) diseases. Quitting smoking is hard, but it is one of the best things that you can do for your health. It is never too late to quit. What are the benefits of quitting smoking? When you quit smoking, you lower your risk for getting serious diseases and conditions. They can include:  Lung cancer or lung disease.  Heart disease.  Stroke.  Heart attack.  Not being able to have children (infertility).  Weak bones (osteoporosis) and broken bones (fractures).  If you have coughing, wheezing, and shortness of breath, those symptoms may get better when you quit. You may also get sick less often. If you are pregnant, quitting smoking can help to lower your chances of having a baby of low birth weight. What can I do to help me quit smoking? Talk with your doctor about what can help you quit smoking. Some things you can do (strategies) include:  Quitting smoking totally, instead of slowly cutting back how much you smoke over a period of time.  Going to in-person counseling. You are more likely to quit if you go to many counseling sessions.  Using resources and support systems, such as: ? Online chats with a counselor. ? Phone quitlines. ? Printed self-help materials. ? Support groups or group counseling. ? Text messaging programs. ? Mobile phone apps or applications.  Taking medicines. Some of these medicines may have nicotine in them. If you are pregnant or breastfeeding, do not take any medicines to quit smoking unless your doctor says it is okay. Talk with your doctor about counseling or other things that can help you.  Talk with your doctor about using more than one strategy at the same time, such as taking medicines while you are also going to in-person counseling. This can help make  quitting easier. What things can I do to make it easier to quit? Quitting smoking might feel very hard at first, but there is a lot that you can do to make it easier. Take these steps:  Talk to your family and friends. Ask them to support and encourage you.  Call phone quitlines, reach out to support groups, or work with a counselor.  Ask people who smoke to not smoke around you.  Avoid places that make you want (trigger) to smoke, such as: ? Bars. ? Parties. ? Smoke-break areas at work.  Spend time with people who do not smoke.  Lower the stress in your life. Stress can make you want to smoke. Try these things to help your stress: ? Getting regular exercise. ? Deep-breathing exercises. ? Yoga. ? Meditating. ? Doing a body scan. To do this, close your eyes, focus on one area of your body at a time from head to toe, and notice which parts of your body are tense. Try to relax the muscles in those areas.  Download or buy apps on your mobile phone or tablet that can help you stick to your quit plan. There are many free apps, such as QuitGuide from the CDC (Centers for Disease Control and Prevention). You can find more support from smokefree.gov and other websites.  This information is not intended to replace advice given to you by your health care provider. Make sure you discuss any questions you have with your health care provider. Document Released: 12/04/2008 Document   Revised: 10/06/2015 Document Reviewed: 06/24/2014  Back Exercises If you have pain in your back, do these exercises 2-3 times each day or as told by your doctor. When the pain goes away, do the exercises once each day, but repeat the steps more times for each exercise (do more repetitions). If you do not have pain in your back, do these exercises once each day or as told by your doctor. Exercises Single Knee to Chest  Do these steps 3-5 times in a row for each leg: 1. Lie on your back on a firm bed or the floor with  your legs stretched out. 2. Bring one knee to your chest. 3. Hold your knee to your chest by grabbing your knee or thigh. 4. Pull on your knee until you feel a gentle stretch in your lower back. 5. Keep doing the stretch for 10-30 seconds. 6. Slowly let go of your leg and straighten it.  Pelvic Tilt  Do these steps 5-10 times in a row: 1. Lie on your back on a firm bed or the floor with your legs stretched out. 2. Bend your knees so they point up to the ceiling. Your feet should be flat on the floor. 3. Tighten your lower belly (abdomen) muscles to press your lower back against the floor. This will make your tailbone point up to the ceiling instead of pointing down to your feet or the floor. 4. Stay in this position for 5-10 seconds while you gently tighten your muscles and breathe evenly.  Cat-Cow  Do these steps until your lower back bends more easily: 1. Get on your hands and knees on a firm surface. Keep your hands under your shoulders, and keep your knees under your hips. You may put padding under your knees. 2. Let your head hang down, and make your tailbone point down to the floor so your lower back is round like the back of a cat. 3. Stay in this position for 5 seconds. 4. Slowly lift your head and make your tailbone point up to the ceiling so your back hangs low (sags) like the back of a cow. 5. Stay in this position for 5 seconds.  Press-Ups  Do these steps 5-10 times in a row: 1. Lie on your belly (face-down) on the floor. 2. Place your hands near your head, about shoulder-width apart. 3. While you keep your back relaxed and keep your hips on the floor, slowly straighten your arms to raise the top half of your body and lift your shoulders. Do not use your back muscles. To make yourself more comfortable, you may change where you place your hands. 4. Stay in this position for 5 seconds. 5. Slowly return to lying flat on the floor.  Bridges  Do these steps 10 times in a  row: 1. Lie on your back on a firm surface. 2. Bend your knees so they point up to the ceiling. Your feet should be flat on the floor. 3. Tighten your butt muscles and lift your butt off of the floor until your waist is almost as high as your knees. If you do not feel the muscles working in your butt and the back of your thighs, slide your feet 1-2 inches farther away from your butt. 4. Stay in this position for 3-5 seconds. 5. Slowly lower your butt to the floor, and let your butt muscles relax.  If this exercise is too easy, try doing it with your arms crossed over your chest. Belly Crunches  Do these steps 5-10 times in a row: 1. Lie on your back on a firm bed or the floor with your legs stretched out. 2. Bend your knees so they point up to the ceiling. Your feet should be flat on the floor. 3. Cross your arms over your chest. 4. Tip your chin a little bit toward your chest but do not bend your neck. 5. Tighten your belly muscles and slowly raise your chest just enough to lift your shoulder blades a tiny bit off of the floor. 6. Slowly lower your chest and your head to the floor.  Back Lifts Do these steps 5-10 times in a row: 1. Lie on your belly (face-down) with your arms at your sides, and rest your forehead on the floor. 2. Tighten the muscles in your legs and your butt. 3. Slowly lift your chest off of the floor while you keep your hips on the floor. Keep the back of your head in line with the curve in your back. Look at the floor while you do this. 4. Stay in this position for 3-5 seconds. 5. Slowly lower your chest and your face to the floor.  Contact a doctor if:  Your back pain gets a lot worse when you do an exercise.  Your back pain does not lessen 2 hours after you exercise. If you have any of these problems, stop doing the exercises. Do not do them again unless your doctor says it is okay. Get help right away if:  You have sudden, very bad back pain. If this happens,  stop doing the exercises. Do not do them again unless your doctor says it is okay. This information is not intended to replace advice given to you by your health care provider. Make sure you discuss any questions you have with your health care provider. Document Released: 03/12/2010 Document Revised: 07/16/2015 Document Reviewed: 04/03/2014 Elsevier Interactive Patient Education  2018 [employer]. Chartered certified accountant Patient Education  [employer].

## 2017-04-12 ENCOUNTER — Encounter: Payer: Self-pay | Admitting: Orthopaedic Surgery

## 2017-04-12 ENCOUNTER — Ambulatory Visit: Payer: Medicaid Other | Admitting: Orthopaedic Surgery

## 2017-04-12 VITALS — BP 159/100 | Temp 98.5°F | Ht 68.0 in | Wt 139.2 lb

## 2017-04-12 DIAGNOSIS — J449 Chronic obstructive pulmonary disease, unspecified: Secondary | ICD-10-CM | POA: Diagnosis not present

## 2017-04-12 DIAGNOSIS — G8929 Other chronic pain: Secondary | ICD-10-CM | POA: Diagnosis not present

## 2017-04-12 DIAGNOSIS — F17200 Nicotine dependence, unspecified, uncomplicated: Secondary | ICD-10-CM

## 2017-04-12 DIAGNOSIS — M5442 Lumbago with sciatica, left side: Secondary | ICD-10-CM

## 2017-04-12 MED ORDER — HYDROCODONE-ACETAMINOPHEN 5-325 MG PO TABS: ORAL_TABLET | ORAL | 0 refills | Status: DC

## 2017-04-12 NOTE — Patient Instructions (Signed)
Steps to Quit Smoking Smoking tobacco can be harmful to your health and can affect almost every organ in your body. Smoking puts you, and those around you, at risk for developing many serious chronic diseases. Quitting smoking is difficult, but it is one of the best things that you can do for your health. It is never too late to quit. What are the benefits of quitting smoking? When you quit smoking, you lower your risk of developing serious diseases and conditions, such as:  Lung cancer or lung disease, such as COPD.  Heart disease.  Stroke.  Heart attack.  Infertility.  Osteoporosis and bone fractures.  Additionally, symptoms such as coughing, wheezing, and shortness of breath may get better when you quit. You may also find that you get sick less often because your body is stronger at fighting off colds and infections. If you are pregnant, quitting smoking can help to reduce your chances of having a baby of low birth weight. How do I get ready to quit? When you decide to quit smoking, create a plan to make sure that you are successful. Before you quit:  Pick a date to quit. Set a date within the next two weeks to give you time to prepare.  Write down the reasons why you are quitting. Keep this list in places where you will see it often, such as on your bathroom mirror or in your car or wallet.  Identify the people, places, things, and activities that make you want to smoke (triggers) and avoid them. Make sure to take these actions: ? Throw away all cigarettes at home, at work, and in your car. ? Throw away smoking accessories, such as ashtrays and lighters. ? Clean your car and make sure to empty the ashtray. ? Clean your home, including curtains and carpets.  Tell your family, friends, and coworkers that you are quitting. Support from your loved ones can make quitting easier.  Talk with your health care provider about your options for quitting smoking.  Find out what treatment  options are covered by your health insurance.  What strategies can I use to quit smoking? Talk with your healthcare provider about different strategies to quit smoking. Some strategies include:  Quitting smoking altogether instead of gradually lessening how much you smoke over a period of time. Research shows that quitting "cold turkey" is more successful than gradually quitting.  Attending in-person counseling to help you build problem-solving skills. You are more likely to have success in quitting if you attend several counseling sessions. Even short sessions of 10 minutes can be effective.  Finding resources and support systems that can help you to quit smoking and remain smoke-free after you quit. These resources are most helpful when you use them often. They can include: ? Online chats with a counselor. ? Telephone quitlines. ? Printed self-help materials. ? Support groups or group counseling. ? Text messaging programs. ? Mobile phone applications.  Taking medicines to help you quit smoking. (If you are pregnant or breastfeeding, talk with your health care provider first.) Some medicines contain nicotine and some do not. Both types of medicines help with cravings, but the medicines that include nicotine help to relieve withdrawal symptoms. Your health care provider may recommend: ? Nicotine patches, gum, or lozenges. ? Nicotine inhalers or sprays. ? Non-nicotine medicine that is taken by mouth.  Talk with your health care provider about combining strategies, such as taking medicines while you are also receiving in-person counseling. Using these two strategies together   makes you more likely to succeed in quitting than if you used either strategy on its own. If you are pregnant or breastfeeding, talk with your health care provider about finding counseling or other support strategies to quit smoking. Do not take medicine to help you quit smoking unless told to do so by your health care  provider. What things can I do to make it easier to quit? Quitting smoking might feel overwhelming at first, but there is a lot that you can do to make it easier. Take these important actions:  Reach out to your family and friends and ask that they support and encourage you during this time. Call telephone quitlines, reach out to support groups, or work with a counselor for support.  Ask people who smoke to avoid smoking around you.  Avoid places that trigger you to smoke, such as bars, parties, or smoke-break areas at work.  Spend time around people who do not smoke.  Lessen stress in your life, because stress can be a smoking trigger for some people. To lessen stress, try: ? Exercising regularly. ? Deep-breathing exercises. ? Yoga. ? Meditating. ? Performing a body scan. This involves closing your eyes, scanning your body from head to toe, and noticing which parts of your body are particularly tense. Purposefully relax the muscles in those areas.  Download or purchase mobile phone or tablet apps (applications) that can help you stick to your quit plan by providing reminders, tips, and encouragement. There are many free apps, such as QuitGuide from the CDC (Centers for Disease Control and Prevention). You can find other support for quitting smoking (smoking cessation) through smokefree.gov and other websites.  How will I feel when I quit smoking? Within the first 24 hours of quitting smoking, you may start to feel some withdrawal symptoms. These symptoms are usually most noticeable 2-3 days after quitting, but they usually do not last beyond 2-3 weeks. Changes or symptoms that you might experience include:  Mood swings.  Restlessness, anxiety, or irritation.  Difficulty concentrating.  Dizziness.  Strong cravings for sugary foods in addition to nicotine.  Mild weight gain.  Constipation.  Nausea.  Coughing or a sore throat.  Changes in how your medicines work in your  body.  A depressed mood.  Difficulty sleeping (insomnia).  After the first 2-3 weeks of quitting, you may start to notice more positive results, such as:  Improved sense of smell and taste.  Decreased coughing and sore throat.  Slower heart rate.  Lower blood pressure.  Clearer skin.  The ability to breathe more easily.  Fewer sick days.  Quitting smoking is very challenging for most people. Do not get discouraged if you are not successful the first time. Some people need to make many attempts to quit before they achieve long-term success. Do your best to stick to your quit plan, and talk with your health care provider if you have any questions or concerns. This information is not intended to replace advice given to you by your health care provider. Make sure you discuss any questions you have with your health care provider. Document Released: 02/01/2001 Document Revised: 10/06/2015 Document Reviewed: 06/24/2014 Elsevier Interactive Patient Education  2018 [employer]  

## 2017-04-12 NOTE — Progress Notes (Signed)
Patient [name], male DOB:Aug 18, 1965, 52 y.o. NGE:[id number]  Chief Complaint  Patient presents with  . Back Pain    lower back pain- getting worse    HPI  Shane Allison is a 52 y.o. male who has lower back pain that is getting worse with left sided sciatica.  The prednisone dose pack did not help.  He is taking pain medicine which helps some.  He has no new trauma, no weakness.  He saw Dr. Lovena Le the chiropractor about 3 1/2 weeks ago.  He needs to wait for 6 weeks conservative treatment to get MRI.  I have explained this to him.  I will have him return in three weeks and get MRI if not improved.  I told him I will not be in the office that day. HPI  Body mass index is 21.17 kg/m.  ROS  Review of Systems  HENT: Negative for congestion.   Respiratory: Positive for cough and shortness of breath.   Cardiovascular: Negative for chest pain and leg swelling.  Endocrine: Negative for cold intolerance.  Musculoskeletal: Positive for arthralgias, back pain and myalgias.  Allergic/Immunologic: Positive for environmental allergies.  All other systems reviewed and are negative.   Past Medical History:  Diagnosis Date  . Alcohol use   . Asthma   . Cervical radiculopathy   . Chronic neck pain   . COPD (chronic obstructive pulmonary disease) ([place])   . GERD (gastroesophageal reflux disease)   . Hypertension   . Psoriasis   . Shortness of breath dyspnea     Past Surgical History:  Procedure Laterality Date  . BIOPSY  05/12/2015   Procedure: BIOPSY;  Surgeon: Danie Binder, MD;  [facility];  Service: Endoscopy;;  gastric biopsy  . BIOPSY  03/15/2016   Procedure: BIOPSY;  Surgeon: Danie Binder, MD;  [facility];  Service: Endoscopy;;  random colon bx's  . COLONOSCOPY WITH PROPOFOL N/A 03/15/2016   Procedure: COLONOSCOPY WITH PROPOFOL;  Surgeon: Danie Binder, MD;  [facility];  Service: Endoscopy;  Laterality: N/A;  12:30 PM  .  ESOPHAGOGASTRODUODENOSCOPY (EGD) WITH PROPOFOL N/A 05/12/2015   Dr. Oneida Alar: normal esophagus, gastritis, bleeding friable duodenal mucosa  . HAND TENDON SURGERY Right   . POLYPECTOMY  03/15/2016   Procedure: POLYPECTOMY;  Surgeon: Danie Binder, MD;  [facility];  Service: Endoscopy;;  rectal polyp  . THUMB AMPUTATION     Partial left thumb     Family History  Problem Relation Age of Onset  . Colon cancer Neg Hx     Social History Social History   Tobacco Use  . Smoking status: Current Every Day Smoker    Packs/day: 1.00    Years: 40.00    Pack years: 40.00    Types: Cigarettes  . Smokeless tobacco: Never Used  . Tobacco comment: 3 packs a day normally, but states "lately I'm down to 1" as of March 2017   Substance Use Topics  . Alcohol use: Yes    Alcohol/week: 0.0 oz    Comment: PATIENT REPORT 6 PACK OF BEER SEVERAL DAYS PER WEEK BUT NO EVERY DAY. (07/13/16)  . Drug use: No    Allergies  Allergen Reactions  . Penicillins Rash    Has patient had a PCN reaction causing immediate rash, facial/tongue/throat swelling, SOB or lightheadedness with hypotension: Yes Has patient had a PCN reaction causing severe rash involving mucus membranes or skin necrosis: No Has patient  had a PCN reaction that required hospitalization No Has patient had a PCN reaction occurring within the last 10 years: No If all of the above answers are "NO", then may proceed with Cephalosporin use.      Current Outpatient Medications  Medication Sig Dispense Refill  . HYDROcodone-acetaminophen (NORCO/VICODIN) 5-325 MG tablet One tablet by mouth every six hours as needed for pain.  Seven day limit 28 tablet 0  . hyoscyamine (LEVBID) 0.375 MG 12 hr tablet Take 1 tablet (0.375 mg total) by mouth 2 (two) times daily. 60 tablet 0  . lisinopril (PRINIVIL,ZESTRIL) 10 MG tablet Take by mouth.    . losartan (COZAAR) 50 MG tablet Take 50 mg by mouth daily.    . pantoprazole (PROTONIX) 40 MG tablet  Take 1 tablet (40 mg total) by mouth 2 (two) times daily before a meal. 180 tablet 3  . predniSONE (STERAPRED UNI-PAK 21 TAB) 5 MG (21) TBPK tablet Take 6 pills first day; 5 pills second day; 4 pills third day; 3 pills fourth day; 2 pills next day and 1 pill last day. 21 tablet 0  . TALTZ 80 MG/ML SOAJ INJECT THE CONTENTS OF 1 PEN INTO THE SKIN EVERY FOUR WEEKS STARTING WITH WEEK 12  9   No current facility-administered medications for this visit.      Physical Exam  Blood pressure (!) 159/100, temperature 98.5 F (36.9 C), height 5\' 8"  (1.727 m), weight 139 lb 3.2 oz (63.1 kg).  Constitutional: overall normal hygiene, normal nutrition, well developed, normal grooming, normal body habitus. Assistive device:none  Musculoskeletal: gait and station Limp none, muscle tone and strength are normal, no tremors or atrophy is present.  .  Neurological: coordination overall normal.  Deep tendon reflex/nerve stretch intact.  Sensation normal.  Cranial nerves II-XII intact.   Skin:   Normal overall no scars, lesions, ulcers or rashes. No psoriasis.  Psychiatric: Alert and oriented x 3.  Recent memory intact, remote memory unclear.  Normal mood and affect. Well groomed.  Good eye contact.  Cardiovascular: overall no swelling, no varicosities, no edema bilaterally, normal temperatures of the legs and arms, no clubbing, cyanosis and good capillary refill.  Lymphatic: palpation is normal.  Spine/Pelvis examination:  Inspection:  Overall, sacoiliac joint benign and hips nontender; without crepitus or defects.   Thoracic spine inspection: Alignment normal without kyphosis present   Lumbar spine inspection:  Alignment  with normal lumbar lordosis, without scoliosis apparent.   Thoracic spine palpation:  without tenderness of spinal processes   Lumbar spine palpation: with tenderness of lumbar area; with tightness of lumbar muscles    Range of Motion:   Lumbar flexion, forward flexion is 25 with  pain or tenderness    Lumbar extension is 5 with pain or tenderness   Left lateral bend is Normal  with pain or tenderness   Right lateral bend is Normal with pain or tenderness   Straight leg raising is Abnormal- 25 degrees on the left   Strength & tone: Normal   Stability overall normal stability    All other systems reviewed and are negative   The patient has been educated about the nature of the problem(s) and counseled on treatment options.  The patient appeared to understand what I have discussed and is in agreement with it.  Encounter Diagnoses  Name Primary?  . Chronic left-sided low back pain with left-sided sciatica Yes  . Chronic obstructive pulmonary disease, unspecified COPD type ([place])   . Tobacco smoker within  last 12 months     PLAN Call if any problems.  Precautions discussed.  Continue current medications.   Return to clinic 3 weeks   I have reviewed the [place] web site prior to prescribing narcotic medicine for this patient.  Electronically Signed Sanjuana Kava, MD 2/20/20192:47 PM

## 2017-04-19 ENCOUNTER — Other Ambulatory Visit: Payer: Self-pay | Admitting: Orthopaedic Surgery

## 2017-04-19 MED ORDER — HYDROCODONE-ACETAMINOPHEN 5-325 MG PO TABS: ORAL_TABLET | ORAL | 0 refills | Status: DC

## 2017-04-25 ENCOUNTER — Other Ambulatory Visit: Payer: Self-pay | Admitting: Orthopedic Surgery

## 2017-04-26 MED ORDER — HYDROCODONE-ACETAMINOPHEN 5-325 MG PO TABS: ORAL_TABLET | ORAL | 0 refills | Status: DC

## 2017-05-02 ENCOUNTER — Other Ambulatory Visit: Payer: Self-pay | Admitting: Orthopedic Surgery

## 2017-05-02 MED ORDER — HYDROCODONE-ACETAMINOPHEN 5-325 MG PO TABS: ORAL_TABLET | ORAL | 0 refills | Status: DC

## 2017-05-03 ENCOUNTER — Ambulatory Visit: Payer: Medicaid Other | Admitting: Orthopedic Surgery

## 2017-05-08 ENCOUNTER — Telehealth: Payer: Self-pay | Admitting: Orthopedic Surgery

## 2017-05-08 NOTE — Telephone Encounter (Signed)
He needs to wait for 6 weeks conservative treatment to get MRI. Dr Luna Glasgow has explained this to him at last visit  He has to be seen back in office first.

## 2017-05-08 NOTE — Telephone Encounter (Signed)
Patient's friend and spokesperson, Waynetta Sandy called this morning stating that Kahlil was still in a lot of pain and wanted to know if he could go ahead and get MRI scheduled or if he had to wait to set up MRI after his next appointment on 05/15/17.  I told her that I would check with the clinical staff and see if he has met the criteria for getting an MRI appointment.  I will have them call Janett Billow, the caregiver a call at Mr. Capelli home number

## 2017-05-08 NOTE — Telephone Encounter (Signed)
Called patient to advise  °

## 2017-05-09 ENCOUNTER — Other Ambulatory Visit: Payer: Self-pay | Admitting: Orthopedic Surgery

## 2017-05-10 MED ORDER — HYDROCODONE-ACETAMINOPHEN 5-325 MG PO TABS: ORAL_TABLET | ORAL | 0 refills | Status: DC

## 2017-05-15 ENCOUNTER — Encounter: Payer: Self-pay | Admitting: Orthopedic Surgery

## 2017-05-15 ENCOUNTER — Ambulatory Visit: Payer: Medicaid Other | Admitting: Orthopedic Surgery

## 2017-05-15 VITALS — BP 131/83 | HR 95 | Ht 68.0 in | Wt 139.0 lb

## 2017-05-15 DIAGNOSIS — M5136 Other intervertebral disc degeneration, lumbar region: Secondary | ICD-10-CM | POA: Diagnosis not present

## 2017-05-15 DIAGNOSIS — M5442 Lumbago with sciatica, left side: Secondary | ICD-10-CM

## 2017-05-15 DIAGNOSIS — G8929 Other chronic pain: Secondary | ICD-10-CM | POA: Diagnosis not present

## 2017-05-15 MED ORDER — HYDROCODONE-ACETAMINOPHEN 5-325 MG PO TABS: ORAL_TABLET | ORAL | 0 refills | Status: DC

## 2017-05-15 NOTE — Addendum Note (Signed)
Addended byCandice Camp on: 05/15/2017 11:52 AM   Modules accepted: Orders

## 2017-05-15 NOTE — Progress Notes (Signed)
HPI   Shane Allison is a 52 y.o. male who has lower back pain that is getting worse with left sided sciatica.  The prednisone dose pack did not help.  He is taking pain medicine which helps some.  He has no new trauma, no weakness.   He saw Dr. Lovena Le the chiropractor about 3 1/2 weeks ago.  He needs to wait for 6 weeks conservative treatment to get MRI.  I have explained this to him.  I will have him return in three weeks and get MRI if not improved.  I told him I will not be in the office that day. HPI  His previous note is recorded above  He still having severe lower back pain left leg pain radiating all the way to his foot with numbness and tingling even after analgesics and physical therapy with chiropractor   Body mass index is 21.17 kg/m.   ROS   Review of Systems  HENT: Negative for congestion.   Respiratory: Positive for cough and shortness of breath.   Cardiovascular: Negative for chest pain and leg swelling.  Endocrine: Negative for cold intolerance.  Musculoskeletal: Positive for arthralgias, back pain and myalgias.  Allergic/Immunologic: Positive for environmental allergies.  All other systems reviewed and are negative.  Physical Exam  Constitutional: He is oriented to person, place, and time. He appears well-developed and well-nourished.  Vital signs have been reviewed and are stable. Gen. appearance the patient is well-developed and well-nourished with normal grooming and hygiene.   Musculoskeletal:       Lumbar back: He exhibits decreased range of motion, tenderness and spasm. He exhibits no deformity and normal pulse.       Legs: Neurological: He is alert and oriented to person, place, and time.  Skin: Skin is warm and dry. No erythema.  Psychiatric: He has a normal mood and affect.  Vitals reviewed.   Meds ordered this encounter  Medications  . HYDROcodone-acetaminophen (NORCO/VICODIN) 5-325 MG tablet    Sig: One tablet by mouth every six hours as needed  for pain.  Seven day limit    Dispense:  28 tablet    Refill:  0    Encounter Diagnosis  Name Primary?  . Chronic left-sided low back pain with left-sided sciatica Yes    Mri L spine

## 2017-05-16 ENCOUNTER — Other Ambulatory Visit: Payer: Self-pay | Admitting: Orthopedic Surgery

## 2017-05-16 ENCOUNTER — Telehealth: Payer: Self-pay | Admitting: Orthopedic Surgery

## 2017-05-16 MED ORDER — HYDROCODONE-ACETAMINOPHEN 5-325 MG PO TABS: ORAL_TABLET | ORAL | 0 refills | Status: DC

## 2017-05-16 NOTE — Telephone Encounter (Signed)
Juliann Pulse from [facility] called to let you know they had two prescriptions for Hydrocodone for Shane Allison, both e-scribed by you.Marland Kitchen One was e-scribed on 3/25 and one was for today(3/26).  After speaking with Tammy, I advised Juliann Pulse to destroy the one for (3/25).

## 2017-05-18 NOTE — Telephone Encounter (Signed)
I called patient about MRI scan left message for him to call me back. His mom answered phone, but she is not on his HIPAA so I did not give the information to her.

## 2017-05-18 NOTE — Telephone Encounter (Signed)
I have called patient to advise of the scan and made the follow up visit

## 2017-05-22 ENCOUNTER — Ambulatory Visit (HOSPITAL_COMMUNITY)
Admission: RE | Admit: 2017-05-22 | Discharge: 2017-05-22 | Disposition: A | Discharge status: Home / Self Care | Payer: Medicaid Other | Source: Ambulatory Visit | Attending: Orthopedic Surgery | Admitting: Orthopedic Surgery

## 2017-05-22 DIAGNOSIS — M5136 Other intervertebral disc degeneration, lumbar region: Secondary | ICD-10-CM | POA: Diagnosis present

## 2017-05-22 DIAGNOSIS — M48061 Spinal stenosis, lumbar region without neurogenic claudication: Secondary | ICD-10-CM | POA: Insufficient documentation

## 2017-05-23 ENCOUNTER — Other Ambulatory Visit: Payer: Self-pay | Admitting: Orthopedic Surgery

## 2017-05-24 MED ORDER — HYDROCODONE-ACETAMINOPHEN 5-325 MG PO TABS: ORAL_TABLET | ORAL | 0 refills | Status: DC

## 2017-05-29 ENCOUNTER — Telehealth: Payer: Self-pay | Admitting: Orthopedic Surgery

## 2017-05-29 ENCOUNTER — Ambulatory Visit: Payer: Medicaid Other | Admitting: Orthopedic Surgery

## 2017-05-29 NOTE — Telephone Encounter (Signed)
I called patient at 10:30am,regarding the follow up appointment for review of MRI, which had been scheduled for today at 10:20am, as he had not arrived. Patient said he thought that he was scheduled for 11:15am today. Per review of appointment scheduling per clinical staff, shows for 10:20, and reminder call would have been made by [facility].  Relayed that we will now need to re-schedule, due to Dr Aline Brochure being called to do surgery.  Patient is unhappy that he cannot be seen today - said "something needs to be done."  A male relative also called back relaying that patient is in an extreme amount of pain, and may need to go to the emergency room.  Please advise patient (presently, we have moved his appointment from today, to next clinic day, 05/31/17.)

## 2017-05-30 ENCOUNTER — Other Ambulatory Visit: Payer: Self-pay | Admitting: Orthopedic Surgery

## 2017-05-31 ENCOUNTER — Ambulatory Visit: Payer: Medicaid Other | Admitting: Orthopedic Surgery

## 2017-05-31 ENCOUNTER — Encounter: Payer: Self-pay | Admitting: Orthopedic Surgery

## 2017-05-31 VITALS — BP 174/102 | HR 95 | Ht 68.0 in | Wt 136.0 lb

## 2017-05-31 DIAGNOSIS — G8929 Other chronic pain: Secondary | ICD-10-CM | POA: Diagnosis not present

## 2017-05-31 DIAGNOSIS — M5442 Lumbago with sciatica, left side: Secondary | ICD-10-CM | POA: Diagnosis not present

## 2017-05-31 DIAGNOSIS — M5136 Other intervertebral disc degeneration, lumbar region: Secondary | ICD-10-CM | POA: Diagnosis not present

## 2017-05-31 MED ORDER — GABAPENTIN 300 MG PO CAPS: 300.0000 mg | ORAL_CAPSULE | Freq: Three times a day (TID) | ORAL | 5 refills | Status: DC

## 2017-05-31 MED ORDER — METHOCARBAMOL 500 MG PO TABS: 500.0000 mg | ORAL_TABLET | Freq: Three times a day (TID) | ORAL | 1 refills | Status: DC

## 2017-05-31 MED ORDER — HYDROCODONE-ACETAMINOPHEN 5-325 MG PO TABS: ORAL_TABLET | ORAL | 0 refills | Status: DC

## 2017-05-31 NOTE — Progress Notes (Signed)
FOLLOW UP VISIT : MRI RESULTS   Chief Complaint  Patient presents with  . Back Pain    increased pain can not sleep eat or get comfortable      HPI: The patient is here TO DISCUSS THE RESULTS OF MRI  He says hes getting worse in terms of worsening pain above the hydrocodone and more difficulty sleeping and sitting prior history below     Shane Allison is a 52 y.o. male who has lower back pain that is getting worse with left sided sciatica.  The prednisone dose pack did not help.  He is taking pain medicine which helps some.  He has no new trauma, no weakness.   He saw Dr. Lovena Le the chiropractor about 3 1/2 weeks ago.  He needs to wait for 6 weeks conservative treatment to get MRI.  I have explained this to him.  I will have him return in three weeks and get MRI if not improved.  I told him I will not be in the office that day. HPI  His previous note is recorded above  He still having severe lower back pain left leg pain radiating all the way to his foot with numbness and tingling even after analgesics and physical therapy with chiropractor   Review of Systems  Gastrointestinal: Negative.   Genitourinary: Negative.   Musculoskeletal: Positive for back pain.     Current Outpatient Medications:  .  HYDROcodone-acetaminophen (NORCO/VICODIN) 5-325 MG tablet, One tablet by mouth every six hours as needed for pain.  Seven day limit, Disp: 28 tablet, Rfl: 0 .  lisinopril (PRINIVIL,ZESTRIL) 10 MG tablet, Take by mouth., Disp: , Rfl:  .  TALTZ 80 MG/ML SOAJ, INJECT THE CONTENTS OF 1 PEN INTO THE SKIN EVERY FOUR WEEKS STARTING WITH WEEK 12, Disp: , Rfl: 9   BP (!) 174/102   Pulse 95   Ht 5\' 8"  (1.727 m)   Wt 136 lb (61.7 kg)   BMI 20.68 kg/m     Medical decision-making section   DATA  MRI REPORT:   L3-L4: Mild disc desiccation and circumferential disc bulge. Borderline to mild left L3 neural foraminal stenosis.   L4-L5: Mild disc desiccation and mostly far lateral  disc bulging greater on the left. Mild facet hypertrophy. No spinal or lateral recess stenosis. Mild left L4 neural foraminal stenosis.   L5-S1: Disc desiccation and circumferential disc bulge. Mild facet hypertrophy. Mild bilateral lateral recess stenosis at the descending S1 nerve levels, more so on the left. No spinal stenosis. Mild right L5 neural foraminal stenosis.      Encounter Diagnoses  Name Primary?  . Chronic left-sided low back pain with left-sided sciatica Yes  . Other intervertebral disc degeneration, lumbar region    Meds ordered this encounter  Medications  . gabapentin (NEURONTIN) 300 MG capsule    Sig: Take 1 capsule (300 mg total) by mouth 3 (three) times daily.    Dispense:  90 capsule    Refill:  5  . methocarbamol (ROBAXIN) 500 MG tablet    Sig: Take 1 tablet (500 mg total) by mouth 3 (three) times daily.    Dispense:  60 tablet    Refill:  1     PLAN:    Referral to neurosurgery  In the interim I will start him on gabapentin and Robaxin  Use opioids sparingly

## 2017-06-06 ENCOUNTER — Other Ambulatory Visit: Payer: Self-pay | Admitting: Orthopedic Surgery

## 2017-06-06 MED ORDER — HYDROCODONE-ACETAMINOPHEN 5-325 MG PO TABS: ORAL_TABLET | ORAL | 0 refills | Status: DC

## 2017-06-13 ENCOUNTER — Other Ambulatory Visit: Payer: Self-pay | Admitting: Orthopedic Surgery

## 2017-06-14 MED ORDER — HYDROCODONE-ACETAMINOPHEN 5-325 MG PO TABS: ORAL_TABLET | ORAL | 0 refills | Status: DC

## 2017-06-20 ENCOUNTER — Other Ambulatory Visit: Payer: Self-pay | Admitting: Orthopedic Surgery

## 2017-06-20 MED ORDER — HYDROCODONE-ACETAMINOPHEN 5-325 MG PO TABS: ORAL_TABLET | ORAL | 0 refills | Status: DC

## 2017-06-27 ENCOUNTER — Other Ambulatory Visit: Payer: Self-pay | Admitting: Orthopedic Surgery

## 2017-07-26 ENCOUNTER — Other Ambulatory Visit (HOSPITAL_COMMUNITY): Payer: Self-pay | Admitting: Neurosurgery

## 2017-07-26 ENCOUNTER — Emergency Department (HOSPITAL_COMMUNITY): Payer: Medicaid Other

## 2017-07-26 ENCOUNTER — Emergency Department (HOSPITAL_COMMUNITY)
Admission: EM | Admit: 2017-07-26 | Discharge: 2017-07-26 | Disposition: A | Discharge status: Home / Self Care | Payer: Medicaid Other | Attending: Emergency Medicine | Admitting: Emergency Medicine

## 2017-07-26 ENCOUNTER — Encounter (HOSPITAL_COMMUNITY): Payer: Self-pay

## 2017-07-26 ENCOUNTER — Other Ambulatory Visit: Payer: Self-pay

## 2017-07-26 DIAGNOSIS — J449 Chronic obstructive pulmonary disease, unspecified: Secondary | ICD-10-CM | POA: Insufficient documentation

## 2017-07-26 DIAGNOSIS — M25511 Pain in right shoulder: Secondary | ICD-10-CM

## 2017-07-26 DIAGNOSIS — W01198A Fall on same level from slipping, tripping and stumbling with subsequent striking against other object, initial encounter: Secondary | ICD-10-CM | POA: Diagnosis not present

## 2017-07-26 DIAGNOSIS — Y998 Other external cause status: Secondary | ICD-10-CM | POA: Insufficient documentation

## 2017-07-26 DIAGNOSIS — Y939 Activity, unspecified: Secondary | ICD-10-CM | POA: Diagnosis not present

## 2017-07-26 DIAGNOSIS — S4991XA Unspecified injury of right shoulder and upper arm, initial encounter: Secondary | ICD-10-CM | POA: Diagnosis present

## 2017-07-26 DIAGNOSIS — F17210 Nicotine dependence, cigarettes, uncomplicated: Secondary | ICD-10-CM | POA: Insufficient documentation

## 2017-07-26 DIAGNOSIS — M25552 Pain in left hip: Secondary | ICD-10-CM

## 2017-07-26 DIAGNOSIS — I10 Essential (primary) hypertension: Secondary | ICD-10-CM | POA: Diagnosis not present

## 2017-07-26 DIAGNOSIS — Y929 Unspecified place or not applicable: Secondary | ICD-10-CM | POA: Diagnosis not present

## 2017-07-26 DIAGNOSIS — S40011A Contusion of right shoulder, initial encounter: Secondary | ICD-10-CM | POA: Diagnosis not present

## 2017-07-26 DIAGNOSIS — Z79899 Other long term (current) drug therapy: Secondary | ICD-10-CM | POA: Insufficient documentation

## 2017-07-26 MED ORDER — HYDROCODONE-ACETAMINOPHEN 5-325 MG PO TABS
1.0000 | ORAL_TABLET | Freq: Once | ORAL | Status: AC
  Administered 2017-07-26: 1 via ORAL
  Filled 2017-07-26: qty 1

## 2017-07-26 MED ORDER — NAPROXEN 500 MG PO TABS: 500.0000 mg | ORAL_TABLET | Freq: Two times a day (BID) | ORAL | 0 refills | Status: DC

## 2017-07-26 NOTE — ED Provider Notes (Signed)
[facility] EMERGENCY DEPARTMENT Provider Note   CSN: [id number] Arrival date & time: 07/26/17  4098     History   Chief Complaint Chief Complaint  Patient presents with  . Fall    right shoulder pain    HPI Shane Allison is a 52 y.o. male.  HPI  This is a 52 year old male who presents with right shoulder pain.  History of alcohol abuse, COPD, hypertension.  Patient reports that he has a pinched nerve.  He states that occasionally his leg will "give out" and last night this happened.  It made him fall hitting his right shoulder on a counter.  He is right-handed.  He has had persistent pain since that time.  He rates pain at 10 out of 10.  He is not taking anything for the pain.  He does report alcohol use last night.  He denies any numbness or tingling in the right hand.  Denies other injury.  Denies hitting his head or loss of consciousness.  Past Medical History:  Diagnosis Date  . Alcohol use   . Asthma   . Cervical radiculopathy   . Chronic neck pain   . COPD (chronic obstructive pulmonary disease) ([place])   . GERD (gastroesophageal reflux disease)   . Hypertension   . Psoriasis   . Shortness of breath dyspnea     Patient Active Problem List   Diagnosis Date Noted  . GERD (gastroesophageal reflux disease) 07/13/2016  . Tubular adenoma of rectum   . Diarrhea 03/03/2016  . Abnormal result of iron profile testing 03/03/2016  . Anemia 12/04/2015  . GI bleed 05/10/2015  . Syncope and collapse 05/10/2015  . RUQ abdominal pain 05/10/2015  . Alcohol abuse 05/10/2015  . COPD (chronic obstructive pulmonary disease) ([place]) 05/10/2015  . Acute pancreatitis 05/10/2015  . AKI (acute kidney injury) ([place]) 05/10/2015  . Hyponatremia 05/10/2015  . Forehead abrasion   . Radicular pain in right arm 05/14/2013  . Cervical spondylosis without myelopathy 05/14/2013  . Numbness and tingling in hands 05/14/2013  . Contusion shoulder/arm 04/02/2013  . Partial tear of rotator cuff  04/02/2013    Past Surgical History:  Procedure Laterality Date  . BIOPSY  05/12/2015   Procedure: BIOPSY;  Surgeon: Danie Binder, MD;  [facility];  Service: Endoscopy;;  gastric biopsy  . BIOPSY  03/15/2016   Procedure: BIOPSY;  Surgeon: Danie Binder, MD;  [facility];  Service: Endoscopy;;  random colon bx's  . COLONOSCOPY WITH PROPOFOL N/A 03/15/2016   Procedure: COLONOSCOPY WITH PROPOFOL;  Surgeon: Danie Binder, MD;  [facility];  Service: Endoscopy;  Laterality: N/A;  12:30 PM  . ESOPHAGOGASTRODUODENOSCOPY (EGD) WITH PROPOFOL N/A 05/12/2015   Dr. Oneida Alar: normal esophagus, gastritis, bleeding friable duodenal mucosa  . HAND TENDON SURGERY Right   . POLYPECTOMY  03/15/2016   Procedure: POLYPECTOMY;  Surgeon: Danie Binder, MD;  [facility];  Service: Endoscopy;;  rectal polyp  . THUMB AMPUTATION     Partial left thumb         Home Medications    Prior to Admission medications   Medication Sig Start Date End Date Taking? Authorizing Provider  gabapentin (NEURONTIN) 300 MG capsule Take 1 capsule (300 mg total) by mouth 3 (three) times daily. 05/31/17   Carole Civil, MD  HYDROcodone-acetaminophen (NORCO/VICODIN) 5-325 MG tablet One tablet by mouth every six hours as needed for pain.  Seven day limit 06/20/17  Carole Civil, MD  lisinopril (PRINIVIL,ZESTRIL) 10 MG tablet Take by mouth.    [provider]  methocarbamol (ROBAXIN) 500 MG tablet Take 1 tablet (500 mg total) by mouth 3 (three) times daily. 05/31/17   Carole Civil, MD  naproxen (NAPROSYN) 500 MG tablet Take 1 tablet (500 mg total) by mouth 2 (two) times daily. 07/26/17   Horton, Barbette Hair, MD  TALTZ 80 MG/ML SOAJ INJECT THE CONTENTS OF 1 PEN INTO THE SKIN EVERY FOUR WEEKS STARTING WITH WEEK 12 04/03/17   [provider]    Family History Family History  Problem Relation Age of Onset  . Colon cancer Neg Hx     Social History Social  History   Tobacco Use  . Smoking status: Current Every Day Smoker    Packs/day: 1.00    Years: 40.00    Pack years: 40.00    Types: Cigarettes  . Smokeless tobacco: Never Used  . Tobacco comment: 3 packs a day normally, but states "lately I'm down to 1" as of March 2017   Substance Use Topics  . Alcohol use: Yes    Alcohol/week: 0.0 oz    Comment: PATIENT REPORT 6 PACK OF BEER SEVERAL DAYS PER WEEK BUT NO EVERY DAY. (07/13/16)  . Drug use: No     Allergies   Penicillins   Review of Systems Review of Systems  Respiratory: Negative for shortness of breath.   Cardiovascular: Negative for chest pain.  Musculoskeletal:       Right shoulder pain  Neurological: Negative for weakness and numbness.  All other systems reviewed and are negative.    Physical Exam Updated Vital Signs BP (!) 142/99 (BP Location: Left Arm)   Pulse (!) 107   Temp 97.9 F (36.6 C) (Oral)   Resp (!) 22   Ht 5\' 7"  (1.702 m)   Wt 63.5 kg (140 lb)   SpO2 96%   BMI 21.93 kg/m   Physical Exam  Constitutional: He is oriented to person, place, and time.  Disheveled appearing but nontoxic, no acute distress  HENT:  Head: Normocephalic and atraumatic.  Eyes: Pupils are equal, round, and reactive to light.  Neck: Normal range of motion. Neck supple.  Cardiovascular: Normal rate, regular rhythm and normal heart sounds.  No murmur heard. Pulmonary/Chest: Effort normal. No respiratory distress. He has wheezes.  Abdominal: Soft. There is no tenderness.  Musculoskeletal: He exhibits no edema.  Tenderness to palpation along the distal clavicle, no obvious deformity, tenderness at the [facility] joint, normal passive range of motion of the shoulder but limited secondary to pain., 2+ radial pulse  Lymphadenopathy:    He has no cervical adenopathy.  Neurological: He is alert and oriented to person, place, and time.  Skin: Skin is warm and dry.  Psychiatric: He has a normal mood and affect.  Nursing note and vitals  reviewed.    ED Treatments / Results  Labs (all labs ordered are listed, but only abnormal results are displayed) Labs Reviewed - No data to display  EKG None  Radiology Dg Shoulder Right  Result Date: 07/26/2017 CLINICAL DATA:  Status post fall into dresser, with left shoulder pain. Initial encounter. EXAM: RIGHT SHOULDER - 2+ VIEW COMPARISON:  Right shoulder radiographs performed 08/04/2015, and MRI of the right shoulder performed 09/08/2015 FINDINGS: There is no evidence of fracture or dislocation. The right humeral head is seated within the glenoid fossa. The acromioclavicular joint is unremarkable in appearance. No significant soft tissue abnormalities  are seen. The visualized portions of the right lung are clear. IMPRESSION: No evidence of fracture or dislocation. Electronically Signed   By: Garald Balding M.D.   On: 07/26/2017 06:36    Procedures Procedures (including critical care time)  Medications Ordered in ED Medications  HYDROcodone-acetaminophen (NORCO/VICODIN) 5-325 MG per tablet 1 tablet (1 tablet Oral Given 07/26/17 5916)     Initial Impression / Assessment and Plan / ED Course  I have reviewed the triage vital signs and the nursing notes.  Pertinent labs & imaging results that were available during my care of the patient were reviewed by me and considered in my medical decision making (see chart for details).     Patient presents with right shoulder pain after a fall.  He is overall nontoxic-appearing and vital signs notable for slight tachycardia.  He has no obvious deformities on exam and has range of motion but reports pain with range of motion.  X-ray showed no evidence of fracture or dislocation.  He is neurovascularly intact.  Suspect contusion versus rotator injury.  Recommend anti-inflammatories at home.  Follow-up with primary physician if not improving.  After history, exam, and medical workup I feel the patient has been appropriately medically screened  and is safe for discharge home. Pertinent diagnoses were discussed with the patient. Patient was given return precautions.   Final Clinical Impressions(s) / ED Diagnoses   Final diagnoses:  Acute pain of right shoulder  Contusion of right shoulder, initial encounter    ED Discharge Orders        Ordered    naproxen (NAPROSYN) 500 MG tablet  2 times daily     07/26/17 0644       Horton, Barbette Hair, MD 07/26/17 [phone]

## 2017-07-26 NOTE — ED Triage Notes (Signed)
Pt reports falling last night after his left leg "gave out from pinched nerve". Reports hitting right shoulder on dresser before landing on floor. No LOC or head injury reported.

## 2017-07-26 NOTE — Discharge Instructions (Addendum)
Were seen today for right shoulder pain.  Your x-rays do not show any evidence of fracture.  You may have a bruising of the shoulder.  Take naproxen as needed for pain.

## 2017-08-01 ENCOUNTER — Ambulatory Visit (HOSPITAL_COMMUNITY)
Admission: RE | Admit: 2017-08-01 | Discharge: 2017-08-01 | Disposition: A | Discharge status: Home / Self Care | Payer: Medicaid Other | Source: Ambulatory Visit | Attending: Neurosurgery | Admitting: Neurosurgery

## 2017-08-01 ENCOUNTER — Other Ambulatory Visit (HOSPITAL_COMMUNITY): Payer: Self-pay | Admitting: Pulmonary Disease

## 2017-08-01 DIAGNOSIS — M47816 Spondylosis without myelopathy or radiculopathy, lumbar region: Secondary | ICD-10-CM | POA: Diagnosis not present

## 2017-08-01 DIAGNOSIS — M5136 Other intervertebral disc degeneration, lumbar region: Secondary | ICD-10-CM | POA: Insufficient documentation

## 2017-08-01 DIAGNOSIS — M25452 Effusion, left hip: Secondary | ICD-10-CM | POA: Diagnosis not present

## 2017-08-01 DIAGNOSIS — M25552 Pain in left hip: Secondary | ICD-10-CM

## 2017-08-01 DIAGNOSIS — R937 Abnormal findings on diagnostic imaging of other parts of musculoskeletal system: Secondary | ICD-10-CM | POA: Diagnosis not present

## 2017-08-01 DIAGNOSIS — R911 Solitary pulmonary nodule: Secondary | ICD-10-CM

## 2017-08-08 ENCOUNTER — Telehealth: Payer: Self-pay | Admitting: Orthopedic Surgery

## 2017-08-08 NOTE — Telephone Encounter (Signed)
Patient called, and also gave permission for [place] to speak with his designated contact Janett Billow. States had fallen and injured his shoulder, and had treatment at [facility] Emergency room 07/26/17.  States he spoke with Dr Cyndy Freeze (neurosurgeon) however Dr Cyndy Freeze does not treat shoulder issues.  Also discussed due to insurance requirement, that referral from primary care for new problem is needed. Voiced understanding.

## 2017-08-10 ENCOUNTER — Ambulatory Visit (HOSPITAL_COMMUNITY)
Admission: RE | Admit: 2017-08-10 | Discharge: 2017-08-10 | Disposition: A | Discharge status: Home / Self Care | Payer: Medicaid Other | Source: Ambulatory Visit | Attending: Pulmonary Disease | Admitting: Pulmonary Disease

## 2017-08-10 ENCOUNTER — Encounter (HOSPITAL_COMMUNITY): Payer: Self-pay

## 2017-08-18 ENCOUNTER — Ambulatory Visit (HOSPITAL_COMMUNITY): Payer: Medicaid Other

## 2017-08-21 ENCOUNTER — Other Ambulatory Visit: Payer: Self-pay

## 2017-08-21 ENCOUNTER — Emergency Department (HOSPITAL_COMMUNITY)
Admission: EM | Admit: 2017-08-21 | Discharge: 2017-08-21 | Disposition: A | Discharge status: Home / Self Care | Payer: Medicaid Other | Attending: Emergency Medicine | Admitting: Emergency Medicine

## 2017-08-21 ENCOUNTER — Encounter (HOSPITAL_COMMUNITY): Payer: Self-pay | Admitting: Emergency Medicine

## 2017-08-21 ENCOUNTER — Emergency Department (HOSPITAL_COMMUNITY): Payer: Medicaid Other

## 2017-08-21 DIAGNOSIS — Y9301 Activity, walking, marching and hiking: Secondary | ICD-10-CM | POA: Insufficient documentation

## 2017-08-21 DIAGNOSIS — S43101A Unspecified dislocation of right acromioclavicular joint, initial encounter: Secondary | ICD-10-CM | POA: Diagnosis not present

## 2017-08-21 DIAGNOSIS — W010XXA Fall on same level from slipping, tripping and stumbling without subsequent striking against object, initial encounter: Secondary | ICD-10-CM | POA: Insufficient documentation

## 2017-08-21 DIAGNOSIS — Y999 Unspecified external cause status: Secondary | ICD-10-CM | POA: Diagnosis not present

## 2017-08-21 DIAGNOSIS — I10 Essential (primary) hypertension: Secondary | ICD-10-CM | POA: Diagnosis not present

## 2017-08-21 DIAGNOSIS — Z79899 Other long term (current) drug therapy: Secondary | ICD-10-CM | POA: Diagnosis not present

## 2017-08-21 DIAGNOSIS — Y929 Unspecified place or not applicable: Secondary | ICD-10-CM | POA: Insufficient documentation

## 2017-08-21 DIAGNOSIS — S43101S Unspecified dislocation of right acromioclavicular joint, sequela: Secondary | ICD-10-CM

## 2017-08-21 DIAGNOSIS — M25511 Pain in right shoulder: Secondary | ICD-10-CM | POA: Diagnosis present

## 2017-08-21 DIAGNOSIS — F17210 Nicotine dependence, cigarettes, uncomplicated: Secondary | ICD-10-CM | POA: Insufficient documentation

## 2017-08-21 DIAGNOSIS — J449 Chronic obstructive pulmonary disease, unspecified: Secondary | ICD-10-CM | POA: Diagnosis not present

## 2017-08-21 NOTE — ED Provider Notes (Signed)
[facility] EMERGENCY DEPARTMENT Provider Note   CSN: [id number] Arrival date & time: 08/21/17  4268     History   Chief Complaint Chief Complaint  Patient presents with  . Shoulder Pain    HPI Shane Allison is a 52 y.o. male.  He is right-hand dominant.  Is been having right shoulder pain since a mechanical fall about a month ago.  He states his left leg which gives him problems at times gave out on him and he fell onto his right shoulder into the [place].  Since then he has had severe right shoulder pain.  He was seen the day after the fall had negative x-rays and was told it was bruised.  He continues to have limitations in moving the shoulder and says at night when he rolls on it he feels like it sliding out of the socket.  He does get some tingling over the shoulder but nothing down the arm.  He does not work secondary to his disability with his leg.  Is not associated with any chest pain or shortness of breath.  The history is provided by the patient.  Shoulder Pain   This is a new problem. Episode onset: 1 month. The problem occurs constantly. The problem has not changed since onset.The pain is present in the left shoulder. The pain is moderate. Associated symptoms include limited range of motion. The symptoms are aggravated by activity and contact. He has tried OTC pain medications for the symptoms. The treatment provided mild relief. There has been a history of trauma.    Past Medical History:  Diagnosis Date  . Alcohol use   . Asthma   . Cervical radiculopathy   . Chronic neck pain   . COPD (chronic obstructive pulmonary disease) ([place])   . GERD (gastroesophageal reflux disease)   . Hypertension   . Psoriasis   . Shortness of breath dyspnea     Patient Active Problem List   Diagnosis Date Noted  . GERD (gastroesophageal reflux disease) 07/13/2016  . Tubular adenoma of rectum   . Diarrhea 03/03/2016  . Abnormal result of iron profile testing 03/03/2016  . Anemia  12/04/2015  . GI bleed 05/10/2015  . Syncope and collapse 05/10/2015  . RUQ abdominal pain 05/10/2015  . Alcohol abuse 05/10/2015  . COPD (chronic obstructive pulmonary disease) ([place]) 05/10/2015  . Acute pancreatitis 05/10/2015  . AKI (acute kidney injury) ([place]) 05/10/2015  . Hyponatremia 05/10/2015  . Forehead abrasion   . Radicular pain in right arm 05/14/2013  . Cervical spondylosis without myelopathy 05/14/2013  . Numbness and tingling in hands 05/14/2013  . Contusion shoulder/arm 04/02/2013  . Partial tear of rotator cuff 04/02/2013    Past Surgical History:  Procedure Laterality Date  . BIOPSY  05/12/2015   Procedure: BIOPSY;  Surgeon: Danie Binder, MD;  [facility];  Service: Endoscopy;;  gastric biopsy  . BIOPSY  03/15/2016   Procedure: BIOPSY;  Surgeon: Danie Binder, MD;  Location: [facility];  Service: Endoscopy;;  random colon bx's  . COLONOSCOPY WITH PROPOFOL N/A 03/15/2016   Procedure: COLONOSCOPY WITH PROPOFOL;  Surgeon: Danie Binder, MD;  Location: [facility];  Service: Endoscopy;  Laterality: N/A;  12:30 PM  . ESOPHAGOGASTRODUODENOSCOPY (EGD) WITH PROPOFOL N/A 05/12/2015   Dr. Oneida Alar: normal esophagus, gastritis, bleeding friable duodenal mucosa  . HAND TENDON SURGERY Right   . POLYPECTOMY  03/15/2016   Procedure: POLYPECTOMY;  Surgeon: Danie Binder, MD;  Location:  [facility];  Service: Endoscopy;;  rectal polyp  . THUMB AMPUTATION     Partial left thumb         Home Medications    Prior to Admission medications   Medication Sig Start Date End Date Taking? Authorizing Provider  gabapentin (NEURONTIN) 300 MG capsule Take 1 capsule (300 mg total) by mouth 3 (three) times daily. 05/31/17   Carole Civil, MD  HYDROcodone-acetaminophen (NORCO/VICODIN) 5-325 MG tablet One tablet by mouth every six hours as needed for pain.  Seven day limit 06/20/17   Carole Civil, MD  lisinopril (PRINIVIL,ZESTRIL) 10 MG tablet Take by mouth.     [provider]  methocarbamol (ROBAXIN) 500 MG tablet Take 1 tablet (500 mg total) by mouth 3 (three) times daily. 05/31/17   Carole Civil, MD  naproxen (NAPROSYN) 500 MG tablet Take 1 tablet (500 mg total) by mouth 2 (two) times daily. 07/26/17   Horton, Barbette Hair, MD  TALTZ 80 MG/ML SOAJ INJECT THE CONTENTS OF 1 PEN INTO THE SKIN EVERY FOUR WEEKS STARTING WITH WEEK 12 04/03/17   [provider]    Family History Family History  Problem Relation Age of Onset  . Colon cancer Neg Hx     Social History Social History   Tobacco Use  . Smoking status: Current Every Day Smoker    Packs/day: 2.00    Years: 40.00    Pack years: 80.00    Types: Cigarettes  . Smokeless tobacco: Never Used  Substance Use Topics  . Alcohol use: Yes    Alcohol/week: 0.0 oz    Comment: PATIENT REPORT 6 PACK OF BEER SEVERAL DAYS PER WEEK BUT NO EVERY DAY. (07/13/16)  . Drug use: No     Allergies   Penicillins   Review of Systems Review of Systems  Constitutional: Negative for fever.  HENT: Negative for sore throat.   Eyes: Negative for visual disturbance.  Respiratory: Negative for shortness of breath.   Cardiovascular: Negative for chest pain.  Gastrointestinal: Negative for abdominal pain.  Genitourinary: Negative for dysuria.  Musculoskeletal: Positive for back pain and gait problem. Negative for neck pain.  Skin: Negative for rash.  Neurological: Negative for syncope.     Physical Exam Updated Vital Signs BP (!) 156/92 (BP Location: Right Arm)   Pulse 88   Temp (!) 97.4 F (36.3 C) (Oral)   Resp 18   Ht 5\' 7"  (1.702 m)   Wt 63.5 kg (140 lb)   SpO2 100%   BMI 21.93 kg/m   Physical Exam  Constitutional: He appears well-developed and well-nourished.  HENT:  Head: Normocephalic and atraumatic.  Eyes: Conjunctivae are normal.  Neck: Neck supple.  Cardiovascular: Normal rate, regular rhythm, normal heart sounds and intact distal pulses.  Pulmonary/Chest:  Effort normal.  Abdominal: Soft. There is no tenderness. There is no guarding.  Musculoskeletal: He exhibits no deformity.  Patient has diffuse tenderness over his right shoulder.  He does appear to have some clavicle elevation compared to his left side but they look close to the same.  He has limitations in abduction and stops at about 80 degrees, has reasonable internal rotation is able to reach his back pocket.  He is got normal sensation over his axillary distribution and distal has a radial pulse that strong with normal cap refill.  Radial ulnar and median sensory motor distribution intact.  Neurological: He is alert. GCS eye subscore is 4. GCS verbal subscore is 5. GCS motor  subscore is 6.  Skin: Skin is warm and dry.  Psychiatric: He has a normal mood and affect.  Nursing note and vitals reviewed.    ED Treatments / Results  Labs (all labs ordered are listed, but only abnormal results are displayed) Labs Reviewed - No data to display  EKG None  Radiology Dg Clavicle Right  Result Date: 08/21/2017 CLINICAL DATA:  Right shoulder pain after fall 1 month ago. EXAM: RIGHT CLAVICLE - 2+ VIEWS COMPARISON:  Radiographs of August 04, 2015. FINDINGS: No definite fracture is noted. However, there does appear to be widening of the right acromioclavicular joint on this projection suggesting AC joint separation. Visualized ribs appear normal. Glenohumeral joint appears normal. IMPRESSION: Widening of the right acromioclavicular joint is noted compared to prior exam suggesting some degree of AC joint separation. No fracture is noted. Electronically Signed   By: Marijo Conception, M.D.   On: 08/21/2017 08:54   Dg Shoulder Right  Result Date: 08/21/2017 CLINICAL DATA:  Right shoulder pain after fall 1 month ago. EXAM: RIGHT SHOULDER - 2+ VIEW COMPARISON:  Radiographs of July 26, 2017. FINDINGS: There is no evidence of fracture or dislocation. There is no evidence of arthropathy or other focal bone  abnormality. Soft tissues are unremarkable. IMPRESSION: Normal right shoulder. Electronically Signed   By: Marijo Conception, M.D.   On: 08/21/2017 08:47    Procedures Procedures (including critical care time)  Medications Ordered in ED Medications - No data to display   Initial Impression / Assessment and Plan / ED Course  I have reviewed the triage vital signs and the nursing notes.  Pertinent labs & imaging results that were available during my care of the patient were reviewed by me and considered in my medical decision making (see chart for details).  Clinical Course as of Aug 23 1027  Mon Aug 21, 6653  3156 52 year old right-hand-dominant male here with right shoulder pain that is been going on for a month after a fall.  Initial x-rays done at the time of injury were negative.  I am going to re-x-ray him and add on the clavicle film although I think this is still going to end up being some sort of AC sprain versus possible rotator cuff tear with his ongoing symptoms.  He has not followed up with orthopedics and I think would be reasonable.   [MB]  [id number] His x-rays are significant for probable AC separation.  I reviewed this with the patient we will try him in a clavicle strap and have him follow-up with orthopedics.  He is comfortable with plan.   [MB]    Clinical Course User Index [MB] Hayden Rasmussen, MD     Final Clinical Impressions(s) / ED Diagnoses   Final diagnoses:  AC separation, right, sequela    ED Discharge Orders    None       Hayden Rasmussen, MD 08/22/17 1030

## 2017-08-21 NOTE — Discharge Instructions (Addendum)
Your evaluated in the emergency department for persistent right shoulder pain after a fall.  Your x-ray did not show fracture or dislocation but you likely have a AC separation.  We are providing you with a clavicle strap which may help realign that area and giving you the number for orthopedics to follow-up with.  Used to use Tylenol or ibuprofen for pain.

## 2017-08-21 NOTE — ED Triage Notes (Signed)
Pt reports he fell on June 4 and was seen on the 5th for right shoulder pain.  States it was dx as bruised, but pain is increasing and difficulty moving right arm.

## 2017-08-23 ENCOUNTER — Telehealth: Payer: Self-pay | Admitting: Orthopedic Surgery

## 2017-08-23 NOTE — Telephone Encounter (Signed)
Referral has been received from patient's primary care,Dr Luan Pulling for problem of "shoulder separation" - patient has been seen for the shoulder problem at [facility] Emergency room, most recently 08/21/17. Please revew and advise.

## 2017-08-23 NOTE — Telephone Encounter (Signed)
Give appt

## 2017-08-23 NOTE — Telephone Encounter (Signed)
Reached patient, and scheduled appointment.

## 2017-08-28 ENCOUNTER — Encounter: Payer: Self-pay | Admitting: Orthopedic Surgery

## 2017-08-28 ENCOUNTER — Ambulatory Visit: Payer: Medicaid Other | Admitting: Orthopedic Surgery

## 2017-08-28 VITALS — BP 123/75 | HR 81 | Ht 67.0 in | Wt 137.0 lb

## 2017-08-28 DIAGNOSIS — G8929 Other chronic pain: Secondary | ICD-10-CM | POA: Diagnosis not present

## 2017-08-28 DIAGNOSIS — S43101A Unspecified dislocation of right acromioclavicular joint, initial encounter: Secondary | ICD-10-CM | POA: Diagnosis not present

## 2017-08-28 DIAGNOSIS — M25511 Pain in right shoulder: Secondary | ICD-10-CM

## 2017-08-28 NOTE — Progress Notes (Signed)
NEW PROBLEM OFFICE VISI  Chief Complaint  Patient presents with  . Shoulder Pain    right injury 07/25/17    52 year old male presents as a consultation request by Dr. Sinda Du.  He is 52 years old he has a history of a prior AC separation he reinjured the right shoulder on June fourth falling against the dresser when his leg gave way.  (History of degenerative disc disease numbness in his leg causes like to give way again)  Complains of pain over the right shoulder since June 4 its a dull ache gets worse at night it is not gotten significantly better since he fell it is associated with decreased range of motion in the shoulder   Review of Systems  Constitutional: Negative for fever.  Musculoskeletal: Positive for back pain.  Neurological: Positive for tingling.     Past Medical History:  Diagnosis Date  . Alcohol use   . Asthma   . Cervical radiculopathy   . Chronic neck pain   . COPD (chronic obstructive pulmonary disease) ([place])   . GERD (gastroesophageal reflux disease)   . Hypertension   . Psoriasis   . Shortness of breath dyspnea     Past Surgical History:  Procedure Laterality Date  . BIOPSY  05/12/2015   Procedure: BIOPSY;  Surgeon: Danie Binder, MD;  [facility];  Service: Endoscopy;;  gastric biopsy  . BIOPSY  03/15/2016   Procedure: BIOPSY;  Surgeon: Danie Binder, MD;  [facility];  Service: Endoscopy;;  random colon bx's  . COLONOSCOPY WITH PROPOFOL N/A 03/15/2016   Procedure: COLONOSCOPY WITH PROPOFOL;  Surgeon: Danie Binder, MD;  [facility];  Service: Endoscopy;  Laterality: N/A;  12:30 PM  . ESOPHAGOGASTRODUODENOSCOPY (EGD) WITH PROPOFOL N/A 05/12/2015   Dr. Oneida Alar: normal esophagus, gastritis, bleeding friable duodenal mucosa  . HAND TENDON SURGERY Right   . POLYPECTOMY  03/15/2016   Procedure: POLYPECTOMY;  Surgeon: Danie Binder, MD;  [facility];  Service: Endoscopy;;  rectal polyp  . THUMB  AMPUTATION     Partial left thumb     Family History  Problem Relation Age of Onset  . Colon cancer Neg Hx    Social History   Tobacco Use  . Smoking status: Current Every Day Smoker    Packs/day: 2.00    Years: 40.00    Pack years: 80.00    Types: Cigarettes  . Smokeless tobacco: Never Used  Substance Use Topics  . Alcohol use: Yes    Alcohol/week: 0.0 oz    Comment: PATIENT REPORT 6 PACK OF BEER SEVERAL DAYS PER WEEK BUT NO EVERY DAY. (07/13/16)  . Drug use: No    Allergies  Allergen Reactions  . Penicillins Rash    Has patient had a PCN reaction causing immediate rash, facial/tongue/throat swelling, SOB or lightheadedness with hypotension: Yes Has patient had a PCN reaction causing severe rash involving mucus membranes or skin necrosis: No Has patient had a PCN reaction that required hospitalization No Has patient had a PCN reaction occurring within the last 10 years: No If all of the above answers are "NO", then may proceed with Cephalosporin use.      Current Meds  Medication Sig  . HYDROcodone-acetaminophen (NORCO/VICODIN) 5-325 MG tablet One tablet by mouth every six hours as needed for pain.  Seven day limit  . lisinopril (PRINIVIL,ZESTRIL) 10 MG tablet Take by mouth.  Marland Kitchen TALTZ 80 MG/ML SOAJ INJECT THE CONTENTS  OF 1 PEN INTO THE SKIN EVERY FOUR WEEKS STARTING WITH WEEK 12    BP 123/75   Pulse 81   Ht 5\' 7"  (1.702 m)   Wt 137 lb (62.1 kg)   BMI 21.46 kg/m   Physical Exam  Constitutional: He is oriented to person, place, and time. He appears well-developed and well-nourished.  Neurological: He is alert and oriented to person, place, and time. Gait normal.  Psychiatric: He has a normal mood and affect. His behavior is normal. Judgment and thought content normal.    Right Shoulder Exam   Tenderness  The patient is experiencing tenderness in the acromion.  Range of Motion  Active abduction: 90  Passive abduction: 100  External rotation: 50  Forward  flexion: 150   Muscle Strength  Right shoulder normal muscle strength: Weakness in abduction and flexion.  Tests  Apprehension: negative Impingement: positive Drop arm: negative Sulcus: absent  Other  Scars: absent Sensation: normal Pulse: present   Left Shoulder Exam   Tenderness  The patient is experiencing no tenderness.   Range of Motion  The patient has normal left shoulder ROM.  Muscle Strength  The patient has normal left shoulder strength.  Tests  Apprehension: negative Sulcus: absent  Other  Erythema: absent Sensation: normal Pulse: present         MEDICAL DECISION SECTION  Xrays were done at [facility] My independent reading of xrays:  .shoulder x-ray showed normal glenohumeral joint, the clavicle x-ray showed a large separation between the acromion and clavicle with a type II acromial clavicular separation    Encounter Diagnoses  Name Primary?  . Chronic right shoulder pain   . AC separation, right, initial encounter Yes    PLAN: (Rx., injectx, surgery, frx, mri/ct) Recommend physical therapy for 6 weeks understand that Medicaid will only allow 3 visits  After his therapy is completed he will come back and if he is not better we will order an MRI of the shoulder   No orders of the defined types were placed in this encounter.   Arther Abbott, MD  08/28/2017 10:05 AM

## 2017-08-31 ENCOUNTER — Encounter (HOSPITAL_COMMUNITY): Payer: Self-pay | Admitting: Occupational Therapy

## 2017-08-31 ENCOUNTER — Other Ambulatory Visit: Payer: Self-pay

## 2017-08-31 ENCOUNTER — Ambulatory Visit (HOSPITAL_COMMUNITY): Payer: Medicaid Other | Attending: Orthopedic Surgery | Admitting: Occupational Therapy

## 2017-08-31 DIAGNOSIS — M25511 Pain in right shoulder: Secondary | ICD-10-CM | POA: Diagnosis present

## 2017-08-31 DIAGNOSIS — M25611 Stiffness of right shoulder, not elsewhere classified: Secondary | ICD-10-CM | POA: Diagnosis present

## 2017-08-31 DIAGNOSIS — R29898 Other symptoms and signs involving the musculoskeletal system: Secondary | ICD-10-CM | POA: Diagnosis present

## 2017-08-31 NOTE — Patient Instructions (Signed)
SHOULDER: Flexion On Table   Place hands on table, elbows straight. Move hips away from body. Press hands down into table. _10__ reps per set, _2-3__ sets per day  Abduction (Passive)   With arm out to side, resting on table, lower head toward arm, keeping trunk away from table.  Repeat __10__ times. Do _2-3___ sessions per day.  Copyright  VHI. All rights reserved.     Internal Rotation (Assistive)   Seated with elbow bent at right angle and held against side, slide arm on table surface in an inward arc. Repeat _10___ times. Do __2-3__ sessions per day. Activity: Use this motion to brush crumbs off the table.  Copyright  VHI. All rights reserved.

## 2017-08-31 NOTE — Therapy (Signed)
[place], [place], [address] Phone: [phone]   Fax:  [phone]  Occupational Therapy Evaluation  Patient Details  Name: Shane Allison MRN: [id number] Date of Birth: 09-25-1965 Referring Provider: Dr. Arther Abbott   Encounter Date: 08/31/2017  OT End of Session - 08/31/17 1626    Visit Number  1    Number of Visits  8    Date for OT Re-Evaluation  09/30/17    Authorization Type  Medicaid    Authorization Time Period  requesting initial 3 visits    Authorization - Visit Number  0    Authorization - Number of Visits  3    OT Start Time  4627    OT Stop Time  1618    OT Time Calculation (min)  24 min    Activity Tolerance  Patient limited by pain    Behavior During Therapy  [facility] for tasks assessed/performed       Past Medical History:  Diagnosis Date  . Alcohol use   . Asthma   . Cervical radiculopathy   . Chronic neck pain   . COPD (chronic obstructive pulmonary disease) ([place])   . GERD (gastroesophageal reflux disease)   . Hypertension   . Psoriasis   . Shortness of breath dyspnea     Past Surgical History:  Procedure Laterality Date  . BIOPSY  05/12/2015   Procedure: BIOPSY;  Surgeon: Danie Binder, MD;  [facility];  Service: Endoscopy;;  gastric biopsy  . BIOPSY  03/15/2016   Procedure: BIOPSY;  Surgeon: Danie Binder, MD;  [facility];  Service: Endoscopy;;  random colon bx's  . COLONOSCOPY WITH PROPOFOL N/A 03/15/2016   Procedure: COLONOSCOPY WITH PROPOFOL;  Surgeon: Danie Binder, MD;  [facility];  Service: Endoscopy;  Laterality: N/A;  12:30 PM  . ESOPHAGOGASTRODUODENOSCOPY (EGD) WITH PROPOFOL N/A 05/12/2015   Dr. Oneida Alar: normal esophagus, gastritis, bleeding friable duodenal mucosa  . HAND TENDON SURGERY Right   . POLYPECTOMY  03/15/2016   Procedure: POLYPECTOMY;  Surgeon: Danie Binder, MD;  [facility];  Service: Endoscopy;;   rectal polyp  . THUMB AMPUTATION     Partial left thumb     There were no vitals filed for this visit.  Subjective Assessment - 08/31/17 1619    Subjective   S: I didn't have that knot before falling.     Pertinent History  Pt is a 52 y/o male s/p fall on 07/25/17 during which he hit his right shoulder. Pt returned to ED in July due to continued pain and was diagnosed with an [facility] joint separation. Pt was referred to occupational therapy by Dr. Arther Abbott.     Limitations  Pt has a pinched nerve in his back severely limiting his mobility and ability to sit/stand for long periods.     Patient Stated Goals  To be able to use my right arm.     Currently in Pain?  Yes    Pain Score  9     Pain Location  Shoulder    Pain Orientation  Right    Pain Descriptors / Indicators  Aching;Sharp;Shooting;Sore    Pain Type  Acute pain    Pain Radiating Towards  elbow    Pain Onset  More than a month ago    Pain Frequency  Constant    Aggravating Factors   movement    Pain Relieving  Factors  nothing    Effect of Pain on Daily Activities  severe effect on ADL completion    Multiple Pain Sites  No        [facility] OT Assessment - 08/31/17 1549      Assessment   Medical Diagnosis  right AC joint separation    Referring Provider  Dr. Arther Abbott    Onset Date/Surgical Date  07/25/17    Hand Dominance  Right    Next MD Visit  10/09/2017    Prior Therapy  None      Precautions   Precautions  None      Restrictions   Weight Bearing Restrictions  No      Balance Screen   Has the patient fallen in the past 6 months  Yes    How many times?  1    Has the patient had a decrease in activity level because of a fear of falling?   No    Is the patient reluctant to leave their home because of a fear of falling?   No      Prior Function   Level of Independence  Independent    Vocation  On disability    Leisure  playing with daughter-4 y/o      ADL   ADL comments  Pt is having difficulty with  dressing tasks, reaching overhead, reaching behind back, lifting heavy objects, reaching up to face for grooming tasks.       Written Expression   Dominant Hand  Right      Cognition   Overall Cognitive Status  Within Functional Limits for tasks assessed      ROM / Strength   AROM / PROM / Strength  AROM;PROM;Strength      Palpation   Palpation comment  Pt very tender with slight edema at [facility] joint and anterior deltoid. Pt has large bony prominence at site of [facility] joint separation.       AROM   Overall AROM Comments  Assessed sitting, er/IR adducted    AROM Assessment Site  Shoulder    Right/Left Shoulder  Right    Right Shoulder Flexion  71 Degrees    Right Shoulder ABduction  68 Degrees    Right Shoulder Internal Rotation  90 Degrees    Right Shoulder External Rotation  31 Degrees      PROM   Overall PROM Comments  Assessed sitting, er/IR adducted-pt unable to tolerate supine    PROM Assessment Site  Shoulder    Right/Left Shoulder  Right    Right Shoulder Flexion  80 Degrees    Right Shoulder ABduction  79 Degrees    Right Shoulder Internal Rotation  90 Degrees    Right Shoulder External Rotation  42 Degrees      Strength   Overall Strength  Unable to assess;Due to pain    Strength Assessment Site  Shoulder    Right/Left Shoulder  Right                      OT Education - 08/31/17 1625    Education Details  table slides    Person(s) Educated  Patient    Methods  Explanation;Demonstration;Handout    Comprehension  Verbalized understanding;Returned demonstration       OT Short Term Goals - 08/31/17 1631      OT SHORT TERM GOAL #1   Title  Pt will be educated on and independent with HEP to improve  mobility of RUE for use as dominant.     Time  4    Period  Weeks    Status  New      OT SHORT TERM GOAL #2   Title  Pt will decrease pain in RUE to 5/10 or less to improve ability to sleep.     Time  4    Period  Weeks    Status  New      OT SHORT  TERM GOAL #3   Title  Pt will decrease RUE fascial restrictions to minimal amounts or less to improve mobility required for functional reaching tasks.     Time  4    Period  Weeks    Status  New      OT SHORT TERM GOAL #4   Title  Pt will increase RUE A/ROM to [facility] to improve ability to complete dressing and grooming tasks using RUE as dominant.     Time  4    Period  Weeks    Status  New      OT SHORT TERM GOAL #5   Title  Pt will improve RUE strength to 4/5 to improve ability to lift daughter into carseat.     Time  4    Period  Weeks    Status  New               Plan - 08/31/17 1626    Clinical Impression Statement  A: Pt is a 52 y/o male s/p right AC joint separation causing severe pain limiting ability to complete B/IADLs and care for young daughter. Pt is also currently limited in positioning due to severe back pain. Pt educated on HEP for gentle ROM with table slides.     Occupational Profile and client history currently impacting functional performance  Pt is motivated to improve his functioning in the dominant RUE to improve ability to care for his 21 y/o daughter.     Occupational performance deficits (Please refer to evaluation for details):  ADL's;IADL's;Rest and Sleep;Work;Leisure;Social Participation    Rehab Potential  Good    OT Frequency  2x / week    OT Duration  4 weeks    OT Treatment/Interventions  Self-care/ADL training;Therapeutic exercise;Ultrasound;Manual Therapy;Therapeutic activities;Cryotherapy;DME and/or AE instruction;Electrical Stimulation;Moist Heat;Passive range of motion;Patient/family education    Plan  P: Pt will benefit from skilled OT services to decrease pain, fascial restrictions, and edema, increase ROM and strength, and improve functional use of RUE during B/IADL completion. Treatment plan: myofascial release, manual therapy, edema management, P/ROM, AA/ROM, A/ROM, gentle RUE strengthening, scapular mobility and strengthening, postural  training    Clinical Decision Making  Several treatment options, min-mod task modification necessary    OT Home Exercise Plan  7/11: table slides    Consulted and Agree with Plan of Care  Patient       Patient will benefit from skilled therapeutic intervention in order to improve the following deficits and impairments:  Decreased activity tolerance, Decreased strength, Impaired flexibility, Decreased range of motion, Increased edema, Pain, Increased fascial restrictions, Impaired UE functional use  Visit Diagnosis: Acute pain of right shoulder  Stiffness of right shoulder, not elsewhere classified  Other symptoms and signs involving the musculoskeletal system    Problem List Patient Active Problem List   Diagnosis Date Noted  . GERD (gastroesophageal reflux disease) 07/13/2016  . Tubular adenoma of rectum   . Diarrhea 03/03/2016  . Abnormal result of iron profile testing 03/03/2016  .  Anemia 12/04/2015  . GI bleed 05/10/2015  . Syncope and collapse 05/10/2015  . RUQ abdominal pain 05/10/2015  . Alcohol abuse 05/10/2015  . COPD (chronic obstructive pulmonary disease) ([place]) 05/10/2015  . Acute pancreatitis 05/10/2015  . AKI (acute kidney injury) ([place]) 05/10/2015  . Hyponatremia 05/10/2015  . Forehead abrasion   . Radicular pain in right arm 05/14/2013  . Cervical spondylosis without myelopathy 05/14/2013  . Numbness and tingling in hands 05/14/2013  . Contusion shoulder/arm 04/02/2013  . Partial tear of rotator cuff 04/02/2013   Guadelupe Sabin, OTR/L  [phone] 08/31/2017, 4:34 PM  [place] [address] [place], [place], [address] Phone: [phone]   Fax:  [phone]  Name: ROEMELLO SPEYER MRN: [id number] Date of Birth: 09-18-1965

## 2017-09-01 ENCOUNTER — Ambulatory Visit: Payer: Medicaid Other | Admitting: Orthopedic Surgery

## 2017-09-06 ENCOUNTER — Encounter (HOSPITAL_COMMUNITY): Payer: Self-pay

## 2017-09-06 ENCOUNTER — Other Ambulatory Visit: Payer: Self-pay

## 2017-09-06 ENCOUNTER — Ambulatory Visit (HOSPITAL_COMMUNITY): Payer: Medicaid Other

## 2017-09-06 DIAGNOSIS — M25611 Stiffness of right shoulder, not elsewhere classified: Secondary | ICD-10-CM

## 2017-09-06 DIAGNOSIS — R29898 Other symptoms and signs involving the musculoskeletal system: Secondary | ICD-10-CM

## 2017-09-06 DIAGNOSIS — M25511 Pain in right shoulder: Secondary | ICD-10-CM | POA: Diagnosis not present

## 2017-09-06 NOTE — Therapy (Addendum)
[place], [place], [address] Phone: [phone]   Fax:  [phone]  Occupational Therapy Treatment  Patient Details  Name: Shane Allison MRN: [id number] Date of Birth: 23-Jan-1966 Referring Provider: Dr. Arther Abbott   Encounter Date: 09/06/2017  OT End of Session - 09/06/17 1740    Visit Number  2    Number of Visits  8    Date for OT Re-Evaluation  09/30/17    Authorization Type  Medicaid    Authorization Time Period  Medicaid approved 3 visits (09/06/17-09/13/17)    Authorization - Visit Number  1    Authorization - Number of Visits  3    OT Start Time  4650    OT Stop Time  1729    OT Time Calculation (min)  42 min    Activity Tolerance  Patient limited by pain    Behavior During Therapy  [facility] for tasks assessed/performed       Past Medical History:  Diagnosis Date  . Alcohol use   . Asthma   . Cervical radiculopathy   . Chronic neck pain   . COPD (chronic obstructive pulmonary disease) ([place])   . GERD (gastroesophageal reflux disease)   . Hypertension   . Psoriasis   . Shortness of breath dyspnea     Past Surgical History:  Procedure Laterality Date  . BIOPSY  05/12/2015   Procedure: BIOPSY;  Surgeon: Danie Binder, MD;  [facility];  Service: Endoscopy;;  gastric biopsy  . BIOPSY  03/15/2016   Procedure: BIOPSY;  Surgeon: Danie Binder, MD;  [facility];  Service: Endoscopy;;  random colon bx's  . COLONOSCOPY WITH PROPOFOL N/A 03/15/2016   Procedure: COLONOSCOPY WITH PROPOFOL;  Surgeon: Danie Binder, MD;  [facility];  Service: Endoscopy;  Laterality: N/A;  12:30 PM  . ESOPHAGOGASTRODUODENOSCOPY (EGD) WITH PROPOFOL N/A 05/12/2015   Dr. Oneida Alar: normal esophagus, gastritis, bleeding friable duodenal mucosa  . HAND TENDON SURGERY Right   . POLYPECTOMY  03/15/2016   Procedure: POLYPECTOMY;  Surgeon: Danie Binder, MD;  [facility];  Service:  Endoscopy;;  rectal polyp  . THUMB AMPUTATION     Partial left thumb     There were no vitals filed for this visit.  Subjective Assessment - 09/06/17 1714    Subjective   S: It just feels like there is something pulling in the shoulder.    Currently in Pain?  Yes    Pain Score  9     Pain Location  Shoulder    Pain Orientation  Right    Pain Descriptors / Indicators  Aching;Sharp;Shooting;Sore    Pain Type  Acute pain    Pain Radiating Towards  elbow    Pain Onset  More than a month ago    Pain Frequency  Constant    Aggravating Factors   movement    Pain Relieving Factors  nothing     Effect of Pain on Daily Activities  severe effect on ADL completion     Multiple Pain Sites  No         [facility] OT Assessment - 09/06/17 1716      Assessment   Medical Diagnosis  right AC joint separation      Precautions   Precautions  None               OT Treatments/Exercises (OP) - 09/06/17 1717  Exercises   Exercises  Shoulder      Shoulder Exercises: Supine   Protraction  PROM;5 reps;AAROM;10 reps    Horizontal ABduction  PROM;5 reps;AAROM;10 reps    External Rotation  PROM;5 reps;AAROM;10 reps    Internal Rotation  PROM;5 reps;AAROM;10 reps    Flexion  PROM;5 reps;AAROM;10 reps    ABduction  PROM;5 reps;AAROM;10 reps      Shoulder Exercises: Seated   Elevation  AROM;10 reps    Retraction  AROM;10 reps    Row  AROM;10 reps    Protraction  AAROM;10 reps    Horizontal ABduction  AAROM;10 reps    External Rotation  AAROM;10 reps    Internal Rotation  AAROM;10 reps    Flexion  AAROM;10 reps    Abduction  AAROM;10 reps    Other Seated Exercises  shoulder depression; 10X      Shoulder Exercises: Stretch   Wall Stretch - Flexion  2 reps;10 seconds    Wall Stretch - ABduction  2 reps;10 seconds    Other Shoulder Stretches  doorway stretch; 2X for 20"    Other Shoulder Stretches  posterior capsule stretch; 2X for 20"      Manual Therapy   Manual Therapy  Edema  management    Manual therapy comments  Edema management completed seperate from other exercises.    Edema Management  Edema management techniques completed on proximal upper arm and A/C joint region in order to reduce swelling, decrease pain, and increase range.             OT Education - 09/06/17 1738    Education Details  Reviewed evaluation and goals with patient. Patient is in agreement with all goals. Patient HEP updated to include AA/ROM in standing and shoulder stretches.    Person(s) Educated  Patient    Methods  Explanation;Demonstration;Handout    Comprehension  Verbalized understanding;Returned demonstration       OT Short Term Goals - 09/06/17 1741      OT SHORT TERM GOAL #1   Title  Pt will be educated on and independent with HEP to improve mobility of RUE for use as dominant.     Time  4    Period  Weeks    Status  On-going      OT SHORT TERM GOAL #2   Title  Pt will decrease pain in RUE to 5/10 or less to improve ability to sleep.     Time  4    Period  Weeks    Status  On-going      OT SHORT TERM GOAL #3   Title  Pt will decrease RUE fascial restrictions to minimal amounts or less to improve mobility required for functional reaching tasks.     Time  4    Period  Weeks    Status  On-going      OT SHORT TERM GOAL #4   Title  Pt will increase RUE A/ROM to [facility] to improve ability to complete dressing and grooming tasks using RUE as dominant.     Time  4    Period  Weeks    Status  On-going      OT SHORT TERM GOAL #5   Title  Pt will improve RUE strength to 4/5 to improve ability to lift daughter into carseat.     Time  4    Period  Weeks    Status  On-going  Plan - 09/06/17 1742    Clinical Impression Statement  A: Manual techniques performed for edema management around [facility] joint. P/ROM performed. Patient demonstrating improved P/ROM WFL when pulling out and then stretching during flexion and abduction due to reduced pinching  sensation. Patient able to perform AA/ROM in supine and seated demonstrating WFL ROM and reporting minimal pain. Shoulder stretches and AA/ROM added to HEP. Min VC to maintain good posture during exercises.    Plan  P: Continue with manual techniques for edema management. Add kinesiotaping for edema management and postural control. Progress to A/ROM in supine and seated and continue working on improving posture.   Consulted and Agree with Plan of Care  Patient       Patient will benefit from skilled therapeutic intervention in order to improve the following deficits and impairments:  Decreased activity tolerance, Impaired flexibility, Decreased range of motion, Increased edema, Pain, Increased fascial restrictions, Impaired UE functional use, Decreased strength  Visit Diagnosis: Acute pain of right shoulder  Stiffness of right shoulder, not elsewhere classified  Other symptoms and signs involving the musculoskeletal system    Problem List Patient Active Problem List   Diagnosis Date Noted  . GERD (gastroesophageal reflux disease) 07/13/2016  . Tubular adenoma of rectum   . Diarrhea 03/03/2016  . Abnormal result of iron profile testing 03/03/2016  . Anemia 12/04/2015  . GI bleed 05/10/2015  . Syncope and collapse 05/10/2015  . RUQ abdominal pain 05/10/2015  . Alcohol abuse 05/10/2015  . COPD (chronic obstructive pulmonary disease) ([place]) 05/10/2015  . Acute pancreatitis 05/10/2015  . AKI (acute kidney injury) ([place]) 05/10/2015  . Hyponatremia 05/10/2015  . Forehead abrasion   . Radicular pain in right arm 05/14/2013  . Cervical spondylosis without myelopathy 05/14/2013  . Numbness and tingling in hands 05/14/2013  . Contusion shoulder/arm 04/02/2013  . Partial tear of rotator cuff 04/02/2013    Roderic Palau, OT student 09/06/2017, 5:47 PM  [place] [address] [place], [place], [address] Phone: [phone]   Fax:   [phone]  Name: BARAKA KLATT MRN: [id number] Date of Birth: 01-03-1966

## 2017-09-06 NOTE — Patient Instructions (Addendum)
Perform each exercise 10-15 reps. 2-3x days.   Protraction - STANDING  Start by holding a wand or cane at chest height.  Next, slowly push the wand outwards in front of your body so that your elbows become fully straightened. Then, return to the original position.     Shoulder FLEXION - STANDING - PALMS UP  In the standing position, hold a wand/cane with both arms, palms up on both sides. Raise up the wand/cane allowing your unaffected arm to perform most of the effort. Your affected arm should be partially relaxed.      Internal/External ROTATION - STANDING  In the standing position, hold a wand/cane with both hands keeping your elbows bent. Move your arms and wand/cane to one side.  Your affected arm should be partially relaxed while your unaffected arm performs most of the effort.       Shoulder ABDUCTION - STANDING  While holding a wand/cane palm face up on the injured side and palm face down on the uninjured side, slowly raise up your injured arm to the side.                     Horizontal Abduction/Adduction      Straight arms holding cane at shoulder height, bring cane to right, center, left. Repeat starting to left.   Copyright  VHI. All rights reserved.     Complete the following exercises 2-3 times a day.  Doorway Stretch  Place each hand opposite each other on the doorway. (You can change where you feel the stretch by moving arms higher or lower.) Step through with one foot and bend front knee until a stretch is felt and hold. Step through with the opposite foot on the next rep. Hold for __10-15___ seconds. Repeat __2__times.     Scapular Retraction (Standing)   With arms at sides, pinch shoulder blades together. Repeat __10__ times per set. Do __1__ sets per session. Do __2__ sessions per day.  http://orth.exer.us/944   Copyright  VHI. All rights reserved.      Posterior Capsule Stretch   Stand or sit, one arm across  body so hand rests over opposite shoulder. Gently push on crossed elbow with other hand until stretch is felt in shoulder of crossed arm. Hold _10-15__ seconds.  Repeat _2__ times per session. Do ___ sessions per day.   Wall Flexion  Slide your arm up the wall or door frame until a stretch is felt in your shoulder . Hold for 10-15 seconds. Complete 2 times     Shoulder Abduction Stretch  Stand side ways by a wall with affected up on wall. Gently step in toward wall to feel stretch. Hold for 10-15 seconds. Complete 2 times.

## 2017-09-07 ENCOUNTER — Ambulatory Visit (HOSPITAL_COMMUNITY)
Admission: RE | Admit: 2017-09-07 | Discharge: 2017-09-07 | Disposition: A | Discharge status: Home / Self Care | Payer: Medicaid Other | Source: Ambulatory Visit | Attending: Pulmonary Disease | Admitting: Pulmonary Disease

## 2017-09-07 DIAGNOSIS — R911 Solitary pulmonary nodule: Secondary | ICD-10-CM | POA: Diagnosis present

## 2017-09-07 DIAGNOSIS — J432 Centrilobular emphysema: Secondary | ICD-10-CM | POA: Diagnosis not present

## 2017-09-07 DIAGNOSIS — R918 Other nonspecific abnormal finding of lung field: Secondary | ICD-10-CM | POA: Insufficient documentation

## 2017-09-07 DIAGNOSIS — I2510 Atherosclerotic heart disease of native coronary artery without angina pectoris: Secondary | ICD-10-CM | POA: Diagnosis not present

## 2017-09-07 DIAGNOSIS — I700 Atherosclerosis of aorta: Secondary | ICD-10-CM | POA: Insufficient documentation

## 2017-09-08 ENCOUNTER — Ambulatory Visit (HOSPITAL_COMMUNITY): Payer: Medicaid Other | Admitting: Occupational Therapy

## 2017-09-08 ENCOUNTER — Encounter (HOSPITAL_COMMUNITY): Payer: Self-pay | Admitting: Occupational Therapy

## 2017-09-08 DIAGNOSIS — R29898 Other symptoms and signs involving the musculoskeletal system: Secondary | ICD-10-CM

## 2017-09-08 DIAGNOSIS — M25611 Stiffness of right shoulder, not elsewhere classified: Secondary | ICD-10-CM

## 2017-09-08 DIAGNOSIS — M25511 Pain in right shoulder: Secondary | ICD-10-CM

## 2017-09-08 NOTE — Therapy (Addendum)
[place], [place], [address] Phone: [phone]   Fax:  [phone]  Occupational Therapy Treatment  Patient Details  Name: Shane Allison MRN: [id number] Date of Birth: 1965/03/20 Referring Provider: Dr. Arther Abbott   Encounter Date: 09/08/2017  OT End of Session - 09/08/17 1614    Visit Number  3    Number of Visits  8    Date for OT Re-Evaluation  09/30/17    Authorization Type  Medicaid    Authorization Time Period  Medicaid approved 3 visits (09/06/17-09/13/17). Requesting 8 additional visits 7/26-8/23    Authorization - Visit Number  2    Authorization - Number of Visits  3    OT Start Time  7262 pt arrived late    OT Stop Time  1601    OT Time Calculation (min)  27 min    Activity Tolerance  Patient limited by pain    Behavior During Therapy  [facility] for tasks assessed/performed       Past Medical History:  Diagnosis Date  . Alcohol use   . Asthma   . Cervical radiculopathy   . Chronic neck pain   . COPD (chronic obstructive pulmonary disease) ([place])   . GERD (gastroesophageal reflux disease)   . Hypertension   . Psoriasis   . Shortness of breath dyspnea     Past Surgical History:  Procedure Laterality Date  . BIOPSY  05/12/2015   Procedure: BIOPSY;  Surgeon: Danie Binder, MD;  [facility];  Service: Endoscopy;;  gastric biopsy  . BIOPSY  03/15/2016   Procedure: BIOPSY;  Surgeon: Danie Binder, MD;  [facility];  Service: Endoscopy;;  random colon bx's  . COLONOSCOPY WITH PROPOFOL N/A 03/15/2016   Procedure: COLONOSCOPY WITH PROPOFOL;  Surgeon: Danie Binder, MD;  [facility];  Service: Endoscopy;  Laterality: N/A;  12:30 PM  . ESOPHAGOGASTRODUODENOSCOPY (EGD) WITH PROPOFOL N/A 05/12/2015   Dr. Oneida Alar: normal esophagus, gastritis, bleeding friable duodenal mucosa  . HAND TENDON SURGERY Right   . POLYPECTOMY  03/15/2016   Procedure: POLYPECTOMY;  Surgeon:  Danie Binder, MD;  [facility];  Service: Endoscopy;;  rectal polyp  . THUMB AMPUTATION     Partial left thumb     There were no vitals filed for this visit.  Subjective Assessment - 09/08/17 1535    Subjective   S: It's more swollen since last time I was here.     Currently in Pain?  Yes    Pain Score  9     Pain Location  Shoulder    Pain Orientation  Right    Pain Descriptors / Indicators  Aching;Sore;Sharp;Shooting    Pain Type  Acute pain    Pain Radiating Towards  elbow    Pain Onset  More than a month ago    Pain Frequency  Constant    Aggravating Factors   movement    Pain Relieving Factors  nothing    Effect of Pain on Daily Activities  severe effect on ADLs and work    Multiple Pain Sites  No         [facility] OT Assessment - 09/08/17 1535      Assessment   Medical Diagnosis  right AC joint separation      Precautions   Precautions  None               OT  Treatments/Exercises (OP) - 09/08/17 1536      Exercises   Exercises  Shoulder      Shoulder Exercises: Supine   Protraction  PROM;5 reps    Horizontal ABduction  PROM;5 reps    External Rotation  PROM;5 reps    Internal Rotation  PROM;5 reps    Flexion  PROM;5 reps    ABduction  PROM;5 reps      Shoulder Exercises: Seated   Elevation  AROM;10 reps    Retraction  AROM;10 reps    Row  AROM;10 reps    Protraction  AAROM;10 reps    External Rotation  AAROM;10 reps    Flexion  AAROM;10 reps    Other Seated Exercises  shoulder depression; 10X      Manual Therapy   Manual Therapy  Edema management;Taping    Manual therapy comments  Edema management completed seperate from other exercises.    Edema Management  Edema management techniques completed on proximal upper arm and A/C joint region in order to reduce swelling, decrease pain, and increase range.    Kinesiotex  Edema               OT Short Term Goals - 09/06/17 1741      OT SHORT TERM GOAL #1   Title  Pt will be  educated on and independent with HEP to improve mobility of RUE for use as dominant.     Time  4    Period  Weeks    Status  On-going      OT SHORT TERM GOAL #2   Title  Pt will decrease pain in RUE to 5/10 or less to improve ability to sleep.     Time  4    Period  Weeks    Status  On-going      OT SHORT TERM GOAL #3   Title  Pt will decrease RUE fascial restrictions to minimal amounts or less to improve mobility required for functional reaching tasks.     Time  4    Period  Weeks    Status  On-going      OT SHORT TERM GOAL #4   Title  Pt will increase RUE A/ROM to [facility] to improve ability to complete dressing and grooming tasks using RUE as dominant.     Time  4    Period  Weeks    Status  On-going      OT SHORT TERM GOAL #5   Title  Pt will improve RUE strength to 4/5 to improve ability to lift daughter into carseat.     Time  4    Period  Weeks    Status  On-going               Plan - 09/08/17 1614    Clinical Impression Statement  A: Pt arrived late to session today, reports increased swelling since last session. Pt very guarded upon entry, holding RUE against body, slight guarding noted during P/ROM. Session focused on edema management, pt completed scapular ROM and some AA/ROM in standing. OT utilized kinesiotape at [facility] joint and shoulder region to provide postural support and edema management. Pt educated on importance of posture during the day, also educated on edema management. Verbal cuing during session for form and technique.     Plan  P: Follow up on kinesiotaping. Attempt A/ROM and continue with scapular mobility, continue with postural control during session       Patient will  benefit from skilled therapeutic intervention in order to improve the following deficits and impairments:  Decreased activity tolerance, Impaired flexibility, Decreased range of motion, Increased edema, Pain, Increased fascial restrictions, Impaired UE functional use, Decreased  strength  Visit Diagnosis: Acute pain of right shoulder  Stiffness of right shoulder, not elsewhere classified  Other symptoms and signs involving the musculoskeletal system    Problem List Patient Active Problem List   Diagnosis Date Noted  . GERD (gastroesophageal reflux disease) 07/13/2016  . Tubular adenoma of rectum   . Diarrhea 03/03/2016  . Abnormal result of iron profile testing 03/03/2016  . Anemia 12/04/2015  . GI bleed 05/10/2015  . Syncope and collapse 05/10/2015  . RUQ abdominal pain 05/10/2015  . Alcohol abuse 05/10/2015  . COPD (chronic obstructive pulmonary disease) ([place]) 05/10/2015  . Acute pancreatitis 05/10/2015  . AKI (acute kidney injury) ([place]) 05/10/2015  . Hyponatremia 05/10/2015  . Forehead abrasion   . Radicular pain in right arm 05/14/2013  . Cervical spondylosis without myelopathy 05/14/2013  . Numbness and tingling in hands 05/14/2013  . Contusion shoulder/arm 04/02/2013  . Partial tear of rotator cuff 04/02/2013   Guadelupe Sabin, OTR/L  [phone] 09/08/2017, 4:45 PM  [place] [address] [place], [place], [address] Phone: [phone]   Fax:  [phone]  Name: ZACKARY MCKEONE MRN: [id number] Date of Birth: [birth year]   Addendum: 11/22/2017 OCCUPATIONAL THERAPY DISCHARGE SUMMARY  Visits from Start of Care: 3  Current functional level related to goals / functional outcomes: Unknown. Pt did not return since last visit on 09/08/2017. On chart review pt had sx for this issue in 10/2017.    Remaining deficits: unknown   Education / Equipment: HEP  Plan: Patient agrees to discharge.  Patient goals were not met. Patient is being discharged due to not returning since the last visit.  ?????

## 2017-09-13 ENCOUNTER — Ambulatory Visit (HOSPITAL_COMMUNITY): Payer: Medicaid Other | Admitting: Occupational Therapy

## 2017-09-14 ENCOUNTER — Telehealth (HOSPITAL_COMMUNITY): Payer: Self-pay | Admitting: Occupational Therapy

## 2017-09-14 ENCOUNTER — Other Ambulatory Visit (HOSPITAL_COMMUNITY): Payer: Self-pay | Admitting: Neurosurgery

## 2017-09-14 DIAGNOSIS — S43101A Unspecified dislocation of right acromioclavicular joint, initial encounter: Secondary | ICD-10-CM

## 2017-09-14 NOTE — Telephone Encounter (Signed)
Patient was sleep so I left a mess for him to call us when he wakes up. He have been approved for 8 visits from 09-15-17 until 10-12-17 per Guadelupe Sabin.

## 2017-09-26 ENCOUNTER — Ambulatory Visit (HOSPITAL_COMMUNITY)
Admission: RE | Admit: 2017-09-26 | Discharge: 2017-09-26 | Disposition: A | Discharge status: Home / Self Care | Payer: Medicaid Other | Source: Ambulatory Visit | Attending: Neurosurgery | Admitting: Neurosurgery

## 2017-09-26 DIAGNOSIS — S43101A Unspecified dislocation of right acromioclavicular joint, initial encounter: Secondary | ICD-10-CM

## 2017-10-09 ENCOUNTER — Encounter: Payer: Self-pay | Admitting: Orthopedic Surgery

## 2017-10-09 ENCOUNTER — Ambulatory Visit: Payer: Medicaid Other | Admitting: Orthopedic Surgery

## 2017-10-09 VITALS — BP 143/78 | HR 85 | Ht 67.0 in | Wt 128.0 lb

## 2017-10-09 DIAGNOSIS — M75101 Unspecified rotator cuff tear or rupture of right shoulder, not specified as traumatic: Secondary | ICD-10-CM

## 2017-10-09 DIAGNOSIS — S43101D Unspecified dislocation of right acromioclavicular joint, subsequent encounter: Secondary | ICD-10-CM | POA: Diagnosis not present

## 2017-10-09 NOTE — Progress Notes (Signed)
Chief Complaint  Patient presents with  . Follow-up    R shoulder seperation    52 year old male presented as a consultation request by Dr. Sinda Du. He is here today for follow up:    He is 52 years old he has a history of a prior AC separation he reinjured the right shoulder on June fourth falling against the dresser when his leg gave way.  PLAN: (Rx., injectx, surgery, frx, mri/ct) Recommend physical therapy for 6 weeks understand that Medicaid will only allow 3 visits  After his therapy is completed he will come back and if he is not better we will order an MRI of the shoulder:    He says his pain is worse he had an MRI ordered by Dr. Christella Noa is a chronic cyst type III AC separation he has an asymptomatic small rotator cuff tear    Review of Systems  Constitutional: Negative for chills and fever.  Cardiovascular: Negative for chest pain.  Neurological: Negative for tingling.    BP (!) 143/78   Pulse 85   Ht 5\' 7"  (1.702 m)   Wt 128 lb (58.1 kg)   BMI 20.05 kg/m   The right AC joint shows a prominent right distal clavicle which is tender to palpation he has full forward elevation of the right arm without pain but pain reaching across his chest  Physical Exam  Constitutional: He is oriented to person, place, and time. He appears well-developed.  Cardiovascular: Intact distal pulses.  Neurological: He is alert and oriented to person, place, and time.  Skin: Skin is warm and dry. Capillary refill takes less than 2 seconds. No rash noted. No erythema.  Psychiatric: He has a normal mood and affect. His behavior is normal.  Vitals reviewed.  I reviewed his MRI imaging and report interpret imaging: Multiple views right shoulder standard MRI he does have a small rotator cuff tear partial-thickness articular surface tear of 3 mm: But clinically it is of no consequence as he does not have weakness in his rotator cuff he has full forward elevation without pain he does have a  chronic AC separation type III  Recommend referral for possible reconstruction of the [facility] joint  Encounter Diagnoses  Name Primary?  . AC separation, right, subsequent encounter Yes  . Tear of right rotator cuff, unspecified tear extent, unspecified whether traumatic

## 2017-10-25 ENCOUNTER — Other Ambulatory Visit: Payer: Self-pay

## 2017-10-25 ENCOUNTER — Encounter (INDEPENDENT_AMBULATORY_CARE_PROVIDER_SITE_OTHER): Payer: Self-pay | Admitting: Orthopedic Surgery

## 2017-10-25 ENCOUNTER — Encounter (HOSPITAL_COMMUNITY): Payer: Self-pay | Admitting: *Deleted

## 2017-10-25 ENCOUNTER — Other Ambulatory Visit (INDEPENDENT_AMBULATORY_CARE_PROVIDER_SITE_OTHER): Payer: Self-pay | Admitting: Orthopedic Surgery

## 2017-10-25 ENCOUNTER — Ambulatory Visit (INDEPENDENT_AMBULATORY_CARE_PROVIDER_SITE_OTHER): Payer: Medicaid Other | Admitting: Orthopedic Surgery

## 2017-10-25 DIAGNOSIS — S43101A Unspecified dislocation of right acromioclavicular joint, initial encounter: Secondary | ICD-10-CM

## 2017-10-25 NOTE — Progress Notes (Signed)
Pt denies any acute cardiopulmonary issues. Pt denies being under the care of a cardiologist. Pt denies having a stress test, echo and cardiac cath.   Pt denies having an EKG and chest x ray within the last year. Pt denies recent labs. Pt made aware to stop taking vitamins, fish oil and herbal medications. Do not take any NSAIDs ie: Ibuprofen, Advil, Naproxen (Aleve), Motrin, BC and Goody Powder. Pt verbalized understanding of all pre-op instructions.

## 2017-10-26 ENCOUNTER — Ambulatory Visit (HOSPITAL_COMMUNITY): Payer: Medicaid Other | Admitting: Anesthesiology

## 2017-10-26 ENCOUNTER — Other Ambulatory Visit: Payer: Self-pay

## 2017-10-26 ENCOUNTER — Ambulatory Visit (HOSPITAL_COMMUNITY): Payer: Medicaid Other

## 2017-10-26 ENCOUNTER — Encounter (HOSPITAL_COMMUNITY)
Admission: RE | Disposition: A | Discharge status: Home / Self Care | Payer: Self-pay | Source: Ambulatory Visit | Attending: Orthopedic Surgery

## 2017-10-26 ENCOUNTER — Ambulatory Visit (HOSPITAL_COMMUNITY): Admission: RE | Admit: 2017-10-26 | Payer: Medicaid Other | Source: Ambulatory Visit | Admitting: Orthopedic Surgery

## 2017-10-26 ENCOUNTER — Encounter (HOSPITAL_COMMUNITY): Payer: Self-pay | Admitting: *Deleted

## 2017-10-26 ENCOUNTER — Ambulatory Visit (HOSPITAL_COMMUNITY)
Admission: RE | Admit: 2017-10-26 | Discharge: 2017-10-26 | Disposition: A | Discharge status: Home / Self Care | Payer: Medicaid Other | Source: Ambulatory Visit | Attending: Orthopedic Surgery | Admitting: Orthopedic Surgery

## 2017-10-26 DIAGNOSIS — Z79899 Other long term (current) drug therapy: Secondary | ICD-10-CM | POA: Insufficient documentation

## 2017-10-26 DIAGNOSIS — G8929 Other chronic pain: Secondary | ICD-10-CM | POA: Insufficient documentation

## 2017-10-26 DIAGNOSIS — M542 Cervicalgia: Secondary | ICD-10-CM | POA: Diagnosis not present

## 2017-10-26 DIAGNOSIS — S43101A Unspecified dislocation of right acromioclavicular joint, initial encounter: Secondary | ICD-10-CM

## 2017-10-26 DIAGNOSIS — X58XXXA Exposure to other specified factors, initial encounter: Secondary | ICD-10-CM | POA: Insufficient documentation

## 2017-10-26 DIAGNOSIS — Z880 Allergy status to penicillin: Secondary | ICD-10-CM | POA: Insufficient documentation

## 2017-10-26 DIAGNOSIS — F17210 Nicotine dependence, cigarettes, uncomplicated: Secondary | ICD-10-CM | POA: Insufficient documentation

## 2017-10-26 DIAGNOSIS — I10 Essential (primary) hypertension: Secondary | ICD-10-CM | POA: Diagnosis not present

## 2017-10-26 DIAGNOSIS — S43109A Unspecified dislocation of unspecified acromioclavicular joint, initial encounter: Secondary | ICD-10-CM

## 2017-10-26 DIAGNOSIS — J449 Chronic obstructive pulmonary disease, unspecified: Secondary | ICD-10-CM | POA: Diagnosis not present

## 2017-10-26 DIAGNOSIS — M1990 Unspecified osteoarthritis, unspecified site: Secondary | ICD-10-CM | POA: Diagnosis not present

## 2017-10-26 DIAGNOSIS — Z419 Encounter for procedure for purposes other than remedying health state, unspecified: Secondary | ICD-10-CM

## 2017-10-26 DIAGNOSIS — K219 Gastro-esophageal reflux disease without esophagitis: Secondary | ICD-10-CM | POA: Diagnosis not present

## 2017-10-26 HISTORY — PX: ACROMIO-CLAVICULAR JOINT REPAIR: SHX5183

## 2017-10-26 HISTORY — DX: Pain in unspecified joint: M25.50

## 2017-10-26 HISTORY — DX: Pneumonia, unspecified organism: J18.9

## 2017-10-26 LAB — CBC
HCT: 42.4 % (ref 39.0–52.0)
Hemoglobin: 14 g/dL (ref 13.0–17.0)
MCH: 33.7 pg (ref 26.0–34.0)
MCHC: 33 g/dL (ref 30.0–36.0)
MCV: 101.9 fL — ABNORMAL HIGH (ref 78.0–100.0)
Platelets: 252 10*3/uL (ref 150–400)
RBC: 4.16 MIL/uL — ABNORMAL LOW (ref 4.22–5.81)
RDW: 11.8 % (ref 11.5–15.5)
WBC: 5 10*3/uL (ref 4.0–10.5)

## 2017-10-26 LAB — COMPREHENSIVE METABOLIC PANEL
ALT: 22 U/L (ref 0–44)
AST: 30 U/L (ref 15–41)
Albumin: 3.8 g/dL (ref 3.5–5.0)
Alkaline Phosphatase: 54 U/L (ref 38–126)
Anion gap: 13 (ref 5–15)
BUN: 5 mg/dL — ABNORMAL LOW (ref 6–20)
CO2: 21 mmol/L — ABNORMAL LOW (ref 22–32)
Calcium: 9.3 mg/dL (ref 8.9–10.3)
Chloride: 108 mmol/L (ref 98–111)
Creatinine, Ser: 0.76 mg/dL (ref 0.61–1.24)
GFR calc Af Amer: >60 mL/min (ref >60)
GFR calc non Af Amer: >60 mL/min (ref >60)
Glucose, Bld: 70 mg/dL (ref 70–99)
Potassium: 4.5 mmol/L (ref 3.5–5.1)
Sodium: 142 mmol/L (ref 135–145)
Total Bilirubin: 0.7 mg/dL (ref 0.3–1.2)
Total Protein: 6.7 g/dL (ref 6.5–8.1)

## 2017-10-26 SURGERY — REPAIR, ACROMIOCLAVICULAR JOINT
Anesthesia: General | Site: Shoulder | Laterality: Right

## 2017-10-26 MED ORDER — CHLORHEXIDINE GLUCONATE 4 % EX LIQD: 60.0000 mL | Freq: Once | CUTANEOUS | Status: DC

## 2017-10-26 MED ORDER — PROMETHAZINE HCL 25 MG/ML IJ SOLN: 6.2500 mg | INTRAMUSCULAR | Status: DC | PRN

## 2017-10-26 MED ORDER — FENTANYL CITRATE (PF) 100 MCG/2ML IJ SOLN: 25.0000 ug | INTRAMUSCULAR | Status: DC | PRN

## 2017-10-26 MED ORDER — LACTATED RINGERS IV SOLN
INTRAVENOUS | Status: DC
  Administered 2017-10-26 (×2): via INTRAVENOUS

## 2017-10-26 MED ORDER — OXYCODONE HCL 5 MG/5ML PO SOLN: 5.0000 mg | Freq: Once | ORAL | Status: DC | PRN

## 2017-10-26 MED ORDER — PROPOFOL 10 MG/ML IV BOLUS
INTRAVENOUS | Status: DC | PRN
  Administered 2017-10-26: 180 mg via INTRAVENOUS
  Administered 2017-10-26: 20 mg via INTRAVENOUS

## 2017-10-26 MED ORDER — MIDAZOLAM HCL 2 MG/2ML IJ SOLN
INTRAMUSCULAR | Status: AC
  Filled 2017-10-26: qty 2

## 2017-10-26 MED ORDER — PROPOFOL 10 MG/ML IV BOLUS
INTRAVENOUS | Status: AC
  Filled 2017-10-26: qty 20

## 2017-10-26 MED ORDER — OXYCODONE HCL 5 MG PO TABS: 5.0000 mg | ORAL_TABLET | Freq: Once | ORAL | Status: DC | PRN

## 2017-10-26 MED ORDER — VANCOMYCIN HCL IN DEXTROSE 1-5 GM/200ML-% IV SOLN
1000.0000 mg | INTRAVENOUS | Status: AC
  Administered 2017-10-26: 1000 mg via INTRAVENOUS
  Filled 2017-10-26: qty 200

## 2017-10-26 MED ORDER — FENTANYL CITRATE (PF) 100 MCG/2ML IJ SOLN
INTRAMUSCULAR | Status: AC
  Administered 2017-10-26: 50 ug via INTRAVENOUS
  Filled 2017-10-26: qty 2

## 2017-10-26 MED ORDER — 0.9 % SODIUM CHLORIDE (POUR BTL) OPTIME
TOPICAL | Status: DC | PRN
  Administered 2017-10-26: 3000 mL
  Administered 2017-10-26: 1000 mL

## 2017-10-26 MED ORDER — LIDOCAINE 2% (20 MG/ML) 5 ML SYRINGE
INTRAMUSCULAR | Status: DC | PRN
  Administered 2017-10-26: 60 mg via INTRAVENOUS

## 2017-10-26 MED ORDER — SODIUM CHLORIDE 0.9 % IV SOLN
INTRAVENOUS | Status: DC | PRN
  Administered 2017-10-26: 40 ug/min via INTRAVENOUS

## 2017-10-26 MED ORDER — DEXAMETHASONE SODIUM PHOSPHATE 10 MG/ML IJ SOLN
INTRAMUSCULAR | Status: AC
  Filled 2017-10-26: qty 1

## 2017-10-26 MED ORDER — ROPIVACAINE HCL 7.5 MG/ML IJ SOLN
INTRAMUSCULAR | Status: DC | PRN
  Administered 2017-10-26: 20 mL via PERINEURAL

## 2017-10-26 MED ORDER — MIDAZOLAM HCL 2 MG/2ML IJ SOLN
INTRAMUSCULAR | Status: AC
  Administered 2017-10-26: 2 mg via INTRAVENOUS
  Filled 2017-10-26: qty 2

## 2017-10-26 MED ORDER — FENTANYL CITRATE (PF) 100 MCG/2ML IJ SOLN
INTRAMUSCULAR | Status: DC | PRN
  Administered 2017-10-26: 150 ug via INTRAVENOUS
  Administered 2017-10-26 (×2): 50 ug via INTRAVENOUS

## 2017-10-26 MED ORDER — PHENYLEPHRINE 40 MCG/ML (10ML) SYRINGE FOR IV PUSH (FOR BLOOD PRESSURE SUPPORT)
PREFILLED_SYRINGE | INTRAVENOUS | Status: DC | PRN
  Administered 2017-10-26 (×3): 80 ug via INTRAVENOUS

## 2017-10-26 MED ORDER — DEXAMETHASONE SODIUM PHOSPHATE 10 MG/ML IJ SOLN
INTRAMUSCULAR | Status: DC | PRN
  Administered 2017-10-26: 10 mg via INTRAVENOUS

## 2017-10-26 MED ORDER — MIDAZOLAM HCL 5 MG/5ML IJ SOLN
INTRAMUSCULAR | Status: DC | PRN
  Administered 2017-10-26: 1 mg via INTRAVENOUS

## 2017-10-26 MED ORDER — SUCCINYLCHOLINE CHLORIDE 200 MG/10ML IV SOSY
PREFILLED_SYRINGE | INTRAVENOUS | Status: AC
  Filled 2017-10-26: qty 10

## 2017-10-26 MED ORDER — ONDANSETRON HCL 4 MG/2ML IJ SOLN
INTRAMUSCULAR | Status: DC | PRN
  Administered 2017-10-26: 4 mg via INTRAVENOUS

## 2017-10-26 MED ORDER — ROCURONIUM BROMIDE 10 MG/ML (PF) SYRINGE
PREFILLED_SYRINGE | INTRAVENOUS | Status: DC | PRN
  Administered 2017-10-26: 50 mg via INTRAVENOUS

## 2017-10-26 MED ORDER — FENTANYL CITRATE (PF) 100 MCG/2ML IJ SOLN
50.0000 ug | Freq: Once | INTRAMUSCULAR | Status: AC
  Administered 2017-10-26: 50 ug via INTRAVENOUS

## 2017-10-26 MED ORDER — PHENYLEPHRINE 40 MCG/ML (10ML) SYRINGE FOR IV PUSH (FOR BLOOD PRESSURE SUPPORT)
PREFILLED_SYRINGE | INTRAVENOUS | Status: AC
  Filled 2017-10-26: qty 20

## 2017-10-26 MED ORDER — FENTANYL CITRATE (PF) 250 MCG/5ML IJ SOLN
INTRAMUSCULAR | Status: AC
  Filled 2017-10-26: qty 5

## 2017-10-26 MED ORDER — MIDAZOLAM HCL 2 MG/2ML IJ SOLN
2.0000 mg | Freq: Once | INTRAMUSCULAR | Status: AC
  Administered 2017-10-26: 2 mg via INTRAVENOUS

## 2017-10-26 MED ORDER — EPHEDRINE 5 MG/ML INJ
INTRAVENOUS | Status: AC
  Filled 2017-10-26: qty 10

## 2017-10-26 MED ORDER — ONDANSETRON HCL 4 MG/2ML IJ SOLN
INTRAMUSCULAR | Status: AC
  Filled 2017-10-26: qty 2

## 2017-10-26 SURGICAL SUPPLY — 52 items
CLOSURE STERI-STRIP 1/4X4 (GAUZE/BANDAGES/DRESSINGS) ×3 IMPLANT
CLOSURE WOUND 1/2 X4 (GAUZE/BANDAGES/DRESSINGS) ×1
COVER MAYO STAND STRL (DRAPES) ×2 IMPLANT
COVER SURGICAL LIGHT HANDLE (MISCELLANEOUS) ×3 IMPLANT
DRAPE C-ARM 42X72 X-RAY (DRAPES) ×3 IMPLANT
DRAPE IMP U-DRAPE 54X76 (DRAPES) ×2 IMPLANT
DRAPE INCISE IOBAN 66X45 STRL (DRAPES) ×6 IMPLANT
DRAPE ORTHO SPLIT 77X108 STRL (DRAPES) ×6
DRAPE SURG ORHT 6 SPLT 77X108 (DRAPES) ×2 IMPLANT
DRAPE U-SHAPE 47X51 STRL (DRAPES) ×6 IMPLANT
DRSG AQUACEL AG ADV 3.5X 6 (GAUZE/BANDAGES/DRESSINGS) IMPLANT
DRSG TEGADERM 4X4.75 (GAUZE/BANDAGES/DRESSINGS) ×4 IMPLANT
DURAPREP 26ML APPLICATOR (WOUND CARE) ×3 IMPLANT
ELECT REM PT RETURN 9FT ADLT (ELECTROSURGICAL) ×3
ELECTRODE REM PT RTRN 9FT ADLT (ELECTROSURGICAL) ×1 IMPLANT
FIBERLOOP 2 0 (SUTURE) ×2 IMPLANT
FIBERSTICK 2 (SUTURE) ×4 IMPLANT
GAUZE SPONGE 4X4 12PLY STRL (GAUZE/BANDAGES/DRESSINGS) ×2 IMPLANT
GAUZE XEROFORM 1X8 LF (GAUZE/BANDAGES/DRESSINGS) ×2 IMPLANT
GLOVE BIOGEL PI IND STRL 8 (GLOVE) ×1 IMPLANT
GLOVE BIOGEL PI INDICATOR 8 (GLOVE) ×2
GLOVE SURG ORTHO 8.0 STRL STRW (GLOVE) ×3 IMPLANT
GOWN STRL REUS W/ TWL LRG LVL3 (GOWN DISPOSABLE) ×2 IMPLANT
GOWN STRL REUS W/ TWL XL LVL3 (GOWN DISPOSABLE) ×1 IMPLANT
GOWN STRL REUS W/TWL LRG LVL3 (GOWN DISPOSABLE) ×6
GOWN STRL REUS W/TWL XL LVL3 (GOWN DISPOSABLE) ×3
KIT BASIN OR (CUSTOM PROCEDURE TRAY) ×3 IMPLANT
KIT TURNOVER KIT B (KITS) ×3 IMPLANT
MANIFOLD NEPTUNE II (INSTRUMENTS) ×3 IMPLANT
NDL HYPO 25GX1X1/2 BEV (NEEDLE) IMPLANT
NEEDLE HYPO 25GX1X1/2 BEV (NEEDLE) IMPLANT
NS IRRIG 1000ML POUR BTL (IV SOLUTION) ×6 IMPLANT
PACK SHOULDER (CUSTOM PROCEDURE TRAY) ×3 IMPLANT
PAD ARMBOARD 7.5X6 YLW CONV (MISCELLANEOUS) ×6 IMPLANT
RETRIEVER SUT HEWSON (MISCELLANEOUS) ×2 IMPLANT
SLING ARM IMMOBILIZER LRG (SOFTGOODS) ×2 IMPLANT
SLING ARM IMMOBILIZER MED (SOFTGOODS) IMPLANT
SPONGE LAP 4X18 RFD (DISPOSABLE) ×3 IMPLANT
STRIP CLOSURE SKIN 1/2X4 (GAUZE/BANDAGES/DRESSINGS) ×1 IMPLANT
SUCTION FRAZIER HANDLE 10FR (MISCELLANEOUS)
SUCTION TUBE FRAZIER 10FR DISP (MISCELLANEOUS) IMPLANT
SUT MNCRL AB 4-0 PS2 18 (SUTURE) ×5 IMPLANT
SUT VIC AB 0 CT1 27 (SUTURE) ×6
SUT VIC AB 0 CT1 27XBRD ANBCTR (SUTURE) ×1 IMPLANT
SUT VIC AB 1 CT1 27 (SUTURE) ×6
SUT VIC AB 1 CT1 27XBRD ANBCTR (SUTURE) IMPLANT
SUT VIC AB 2-0 CT1 27 (SUTURE) ×9
SUT VIC AB 2-0 CT1 TAPERPNT 27 (SUTURE) ×1 IMPLANT
SYR CONTROL 10ML LL (SYRINGE) ×3 IMPLANT
SYSTEM AC REPAIR LO-PRO KNTLS (Anchor) ×2 IMPLANT
TOWEL OR 17X24 6PK STRL BLUE (TOWEL DISPOSABLE) ×3 IMPLANT
TOWEL OR 17X26 10 PK STRL BLUE (TOWEL DISPOSABLE) ×3 IMPLANT

## 2017-10-26 NOTE — Anesthesia Preprocedure Evaluation (Addendum)
Anesthesia Evaluation  Patient identified by MRN, date of birth, ID band Patient awake    Reviewed: Allergy & Precautions, NPO status , Patient's Chart, lab work & pertinent test results  History of Anesthesia Complications Negative for: history of anesthetic complications  Airway Mallampati: I  TM Distance: >3 FB Neck ROM: Full    Dental  (+) Dental Advisory Given, Edentulous Upper, Partial Lower   Pulmonary asthma , COPD, Current Smoker (2 ppd),     + wheezing      Cardiovascular hypertension, Pt. on medications  Rhythm:Regular Rate:Normal     Neuro/Psych  Neuromuscular disease (Cervical radiculopathy) negative psych ROS   GI/Hepatic GERD  Controlled,(+)     substance abuse  alcohol use,   Endo/Other  negative endocrine ROS  Renal/GU negative Renal ROS  negative genitourinary   Musculoskeletal  (+) Arthritis ,  Chronic neck pain    Abdominal   Peds  Hematology negative hematology ROS (+) anemia ,   Anesthesia Other Findings   Reproductive/Obstetrics                            Anesthesia Physical Anesthesia Plan  ASA: II  Anesthesia Plan: General   Post-op Pain Management:  Regional for Post-op pain   Induction: Intravenous  PONV Risk Score and Plan: 3 and Treatment may vary due to age or medical condition, Ondansetron, Dexamethasone and Midazolam  Airway Management Planned: Oral ETT  Additional Equipment: None  Intra-op Plan:   Post-operative Plan: Extubation in OR  Informed Consent: I have reviewed the patients History and Physical, chart, labs and discussed the procedure including the risks, benefits and alternatives for the proposed anesthesia with the patient or authorized representative who has indicated his/her understanding and acceptance.   Dental advisory given  Plan Discussed with: CRNA and Anesthesiologist  Anesthesia Plan Comments:         Anesthesia Quick Evaluation

## 2017-10-26 NOTE — Anesthesia Procedure Notes (Signed)
Procedure Name: Intubation Date/Time: 10/26/2017 3:15 PM Performed by: Moshe Salisbury, CRNA Pre-anesthesia Checklist: Patient identified, Emergency Drugs available, Suction available and Patient being monitored Patient Re-evaluated:Patient Re-evaluated prior to induction Oxygen Delivery Method: Circle System Utilized Preoxygenation: Pre-oxygenation with 100% oxygen Induction Type: IV induction Ventilation: Mask ventilation without difficulty Laryngoscope Size: Mac and 4 Grade View: Grade II Tube type: Oral Tube size: 8.0 mm Number of attempts: 1 Airway Equipment and Method: Stylet Placement Confirmation: ETT inserted through vocal cords under direct vision,  positive ETCO2 and breath sounds checked- equal and bilateral Secured at: 22 cm Tube secured with: Tape Dental Injury: Teeth and Oropharynx as per pre-operative assessment

## 2017-10-26 NOTE — Op Note (Signed)
NAME: Shane Allison, SIMPSON MEDICAL RECORD PZ:[id number] ACCOUNT [id number] DATE OF BIRTH:[birth year] FACILITY: [facility] LOCATION: [facility] PHYSICIAN:Frieda Arnall Randel Pigg, MD  OPERATIVE REPORT  DATE OF PROCEDURE:  10/26/2017  PREOPERATIVE DIAGNOSIS:  Right acromioclavicular joint separation.  POSTOPERATIVE DIAGNOSIS:  Right acromioclavicular joint separation.  PROCEDURE:  Right acromioclavicular joint reconstruction using Arthrex TightRope and native coracoacromial ligament.  SURGEON:  Meredith Pel, MD  ASSISTANT:  April Green, RNFA.  INDICATIONS:  This is a 52 year old patient who underwent injury in June with AC separation.  He remained symptomatic with visible deformity and inability to perform manual labor.  He presents now for operative management after explanation of risks and  benefits.  PROCEDURE IN DETAIL:  The patient was brought to the operating room where general anesthetic was induced.  Preoperative antibiotics administered.  Timeout was called.  The patient was placed in the beach chair position with the head in neutral position.   Right arm and shoulder was then prescrubbed with alcohol and Betadine, allowed to air dry.  Prep with DuraPrep solution and draped in a sterile manner.  The topographic anatomy of the shoulder was identified including the anterior lateral and posterior  margin of the acromion as well as the dislocated distal end of the clavicle and the coracoid process.  Incision was made just 2 cm inferior to the coracoid process extending to a point about 25 cm from the distal end of the clavicle.  Skin and  subcutaneous tissue were sharply divided.  Full thickness periosteal flaps were elevated off the distal end of the clavicle.  About 8 mm of the distal end of the clavicle was resected.  At this time, attention was directed towards the coracoid process.   Blunt dissection was performed to split the deltoid and the superior aspect of the coracoid was identified.   At this time, retractors were placed on the anterior and posterior aspect of the coracoid.  I placed my index finger on the undersurface of the  coracoid after splitting the conjoined tendon.  The Arthrex guide was then used to drill the center portion at the base of the coracoid process.  The clavicle was then drilled midway between the trapezoid and conoid attachment sites.  At this time, the  suture was passed from the clavicle through the soft tissue and into the coracoid.  The suture was retrieved.  At the same time, the CA ligament was harvested from around the base of the coracoid extending proximally and laterally.  It was harvested and  then tied with a 2-0 FiberLoop suture.  This was to be placed in the resected end of the distal clavicle.  Two drill holes were placed, one in the marrow cavity and the second drill hole placed exiting out the superior aspect of the clavicle.  The  TightRope was then passed with the dog bone Endobutton device on the inferior surface of the clavicle.  Fluoroscopy was utilized and demonstrated good alignment and position of that Endobutton.  The clavicle was then reduced and the 2 ends were  tightened.  Good reduction of the clavicle was achieved both visually, palpably as well as on examination with the fluoroscopy.  The coracoclavicular distance was restored to normal.  At this time, thorough irrigation was performed.  The CA ligament was  pulled through and attached to the end of the clavicle for added biologic reinforcement.  This was tied around the clavicle for secure fixation.  At this time, thorough irrigation was performed.  Deltoid split was  closed using #1 Vicryl suture.  The  fascia was closed using #1 Vicryl suture superiorly.  At this time, the skin was closed using 0 Vicryl suture, 2-0 Vicryl suture and a 3-0 Monocryl.  Aquacel dressing and shoulder immobilizer placed.  The patient tolerated the procedure well without  immediate complications.  He was  transferred to the recovery room in stable condition.  TN/NUANCE  D:10/26/2017 T:10/26/2017 JOB:[id number]

## 2017-10-26 NOTE — Anesthesia Procedure Notes (Signed)
Anesthesia Regional Block: Interscalene brachial plexus block   Pre-Anesthetic Checklist: ,, timeout performed, Correct Patient, Correct Site, Correct Laterality, Correct Procedure, Correct Position, site marked, Risks and benefits discussed,  Surgical consent,  Pre-op evaluation,  At surgeon's request and post-op pain management  Laterality: Right  Prep: chloraprep       Needles:  Injection technique: Single-shot  Needle Type: Echogenic Needle     Needle Length: 5cm  Needle Gauge: 21     Additional Needles:   Narrative:  Start time: 10/26/2017 2:24 PM End time: 10/26/2017 2:27 PM Injection made incrementally with aspirations every 5 mL.  Performed by: Personally  Anesthesiologist: Audry Pili, MD  Additional Notes: No pain on injection. No increased resistance to injection. Injection made in 5cc increments. Good needle visualization. Patient tolerated the procedure well.

## 2017-10-26 NOTE — Anesthesia Postprocedure Evaluation (Signed)
Anesthesia Post Note  Patient: Shane Allison  Procedure(s) Performed: RIGHT ACROMIO-CLAVICULAR JOINT RECONSTRUCTION (Right Shoulder)     Patient location during evaluation: PACU Anesthesia Type: General Level of consciousness: awake Pain management: pain level controlled Vital Signs Assessment: post-procedure vital signs reviewed and stable Respiratory status: spontaneous breathing Cardiovascular status: stable Postop Assessment: no apparent nausea or vomiting Anesthetic complications: no    Last Vitals:  Vitals:   10/26/17 1815 10/26/17 1819  BP: 130/84 119/85  Pulse: 73 72  Resp: 18 16  Temp: 36.5 C   SpO2: 95%     Last Pain:  Vitals:   10/26/17 1819  TempSrc:   PainSc: 0-No pain                 Bruk Tumolo

## 2017-10-26 NOTE — Brief Op Note (Signed)
10/26/2017  10/26/2017  5:13 PM  PATIENT:  Shane Allison  52 y.o. male  PRE-OPERATIVE DIAGNOSIS:  right acromioclavicular separation  POST-OPERATIVE DIAGNOSIS:  right acromioclavicular separation  PROCEDURE:  Procedure(s): RIGHT ACROMIO-CLAVICULAR JOINT RECONSTRUCTION  SURGEON:  Surgeon(s): Meredith Pel, MD  ASSISTANT: April green RNFA  ANESTHESIA:   general  EBL: 35 ml    Total I/O In: -  Out: 100 [Blood:100]  BLOOD ADMINISTERED: none  DRAINS: none   LOCAL MEDICATIONS USED:  none  SPECIMEN:  No Specimen  COUNTS:  YES  TOURNIQUET:  * No tourniquets in log *  DICTATION: .Other Dictation: Dictation Number [id number]  PLAN OF CARE: Discharge to home after PACU  PATIENT DISPOSITION:  PACU - hemodynamically stable

## 2017-10-26 NOTE — Transfer of Care (Signed)
Immediate Anesthesia Transfer of Care Note  Patient: Shane Allison  Procedure(s) Performed: RIGHT ACROMIO-CLAVICULAR JOINT RECONSTRUCTION (Right Shoulder)  Patient Location: PACU  Anesthesia Type:GA combined with regional for post-op pain  Level of Consciousness: awake and patient cooperative  Airway & Oxygen Therapy: Patient Spontanous Breathing and Patient connected to nasal cannula oxygen  Post-op Assessment: Report given to RN, Post -op Vital signs reviewed and stable and Patient moving all extremities  Post vital signs: Reviewed and stable  Last Vitals:  Vitals Value Taken Time  BP 123/81 10/26/2017  5:43 PM  Temp 36.5 C 10/26/2017  5:40 PM  Pulse 99 10/26/2017  5:43 PM  Resp 23 10/26/2017  5:43 PM  SpO2 96 % 10/26/2017  5:43 PM  Vitals shown include unvalidated device data.  Last Pain:  Vitals:   10/26/17 1320  TempSrc:   PainSc: 0-No pain      Patients Stated Pain Goal: 3 (53/79/43 2761)  Complications: No apparent anesthesia complications

## 2017-10-26 NOTE — H&P (Signed)
Shane Allison is an 52 y.o. male.   Chief Complaint: Right shoulder pain HPI: Shane Allison is a 52 year old patient with right shoulder pain.  Sustained an injury in June.  This looks like it is at least a type III if not type V AC joint separation.  He does strenuous type work.  He has failed conservative management.  He reports pain with any type of lifting or overhead activity with the shoulder.  He is a smoker and this is a mitigating factor for surgical decision-making.  Past Medical History:  Diagnosis Date  . Alcohol use   . Asthma   . Cervical radiculopathy   . Chronic neck pain   . COPD (chronic obstructive pulmonary disease) ([place])   . GERD (gastroesophageal reflux disease)   . Hypertension   . Joint ache    Right AC joint separation  . Pneumonia   . Psoriasis   . Shortness of breath dyspnea     Past Surgical History:  Procedure Laterality Date  . BIOPSY  05/12/2015   Procedure: BIOPSY;  Surgeon: Danie Binder, MD;  [facility];  Service: Endoscopy;;  gastric biopsy  . BIOPSY  03/15/2016   Procedure: BIOPSY;  Surgeon: Danie Binder, MD;  [facility];  Service: Endoscopy;;  random colon bx's  . COLONOSCOPY WITH PROPOFOL N/A 03/15/2016   Procedure: COLONOSCOPY WITH PROPOFOL;  Surgeon: Danie Binder, MD;  [facility];  Service: Endoscopy;  Laterality: N/A;  12:30 PM  . ESOPHAGOGASTRODUODENOSCOPY (EGD) WITH PROPOFOL N/A 05/12/2015   Dr. Oneida Alar: normal esophagus, gastritis, bleeding friable duodenal mucosa  . HAND TENDON SURGERY Right   . MULTIPLE TOOTH EXTRACTIONS    . POLYPECTOMY  03/15/2016   Procedure: POLYPECTOMY;  Surgeon: Danie Binder, MD;  [facility];  Service: Endoscopy;;  rectal polyp  . THUMB AMPUTATION     Partial left thumb     Family History  Problem Relation Age of Onset  . COPD Mother   . Colon cancer Neg Hx    Social History:  reports that he has been smoking cigarettes. He has a 60.00 pack-year smoking  history. He has never used smokeless tobacco. He reports that he drinks alcohol. He reports that he does not use drugs.  Allergies:  Allergies  Allergen Reactions  . Penicillins Rash    Has patient had a PCN reaction causing immediate rash, facial/tongue/throat swelling, SOB or lightheadedness with hypotension: Yes Has patient had a PCN reaction causing severe rash involving mucus membranes or skin necrosis: No Has patient had a PCN reaction that required hospitalization No Has patient had a PCN reaction occurring within the last 10 years: No If all of the above answers are "NO", then may proceed with Cephalosporin use.      Medications Prior to Admission  Medication Sig Dispense Refill  . HYDROcodone-acetaminophen (NORCO/VICODIN) 5-325 MG tablet One tablet by mouth every six hours as needed for pain.  Seven day limit (Patient taking differently: Take 1 tablet by mouth every 6 (six) hours as needed (pain). One tablet by mouth every six hours as needed for pain.  Seven day limit) 28 tablet 0  . lisinopril (PRINIVIL,ZESTRIL) 10 MG tablet Take 10 mg by mouth daily.     Marland Kitchen TALTZ 80 MG/ML SOAJ Inject 80 mg into the skin every 30 (thirty) days.   9    No results found for this or any previous visit (from the past 48 hour(s)). No results  found.  Review of Systems  Musculoskeletal: Positive for joint pain.  All other systems reviewed and are negative.   There were no vitals taken for this visit. Physical Exam  Constitutional: He appears well-developed.  HENT:  Head: Normocephalic.  Eyes: Pupils are equal, round, and reactive to light.  Neck: Normal range of motion.  Cardiovascular: Normal rate.  Respiratory: Effort normal.  Neurological: He is alert.  Skin: Skin is warm.  Psychiatric: He has a normal mood and affect.  Right shoulder demonstrates deformity of the AC joint.  Motor or sensory function to the hand is intact.  Radial pulses intact.  The patient has painful range of motion  above 90 degrees of abduction forward flexion.  Rotator cuff strength is intact.  He does have pain with crossarm adduction and instability of the AC joint.  Tenderness at the [facility] joint is present.  Assessment/Plan Impression is distal clavicle instability in a patient who does physical type work.  He has been like this for several months.  It is not improving.  He does have deformity.  Rest of the shoulder examination on MRI scanning shows structural integrity of the shoulder joint.  Plan at this time is [facility] joint reconstruction using Arthrex tight rope and cortico-acromial ligament transfer to the resected distal end of the clavicle.  Risk and benefits are discussed including but not limited to infection incomplete pain relief as well as potential shoulder stiffness.  Patient understands the risk and benefits.  He is a smoker which will inhibit the native tissue healing and thus the soft tissue transfer will be required.  All questions answered  Anderson Malta, MD 10/26/2017, 12:09 PM

## 2017-10-27 ENCOUNTER — Encounter (HOSPITAL_COMMUNITY): Payer: Self-pay | Admitting: Orthopedic Surgery

## 2017-10-27 ENCOUNTER — Other Ambulatory Visit (HOSPITAL_COMMUNITY): Payer: Self-pay | Admitting: Neurosurgery

## 2017-10-27 ENCOUNTER — Other Ambulatory Visit: Payer: Self-pay | Admitting: Neurosurgery

## 2017-10-27 DIAGNOSIS — M79605 Pain in left leg: Secondary | ICD-10-CM

## 2017-10-27 NOTE — Progress Notes (Signed)
Office Visit Note   Patient: Shane Allison           Date of Birth: 12-11-1965           MRN: [id number] Visit Date: 10/25/2017 Requested by: Sinda Du, MD [place], [place] [id number] PCP: Sinda Du, MD  Subjective: Chief Complaint  Patient presents with  . Right Shoulder - Follow-up    HPI: Shane Allison is a patient with right shoulder pain.  Date of injury July 25, 2017.  He fell and landed on his shoulder.  He is been having some shoulder pain since that time.  He does fairly physical work.  He is a smoker.  Taking Norco for pain.  Radiographs on the system show at least type III AC separation.  MRI scan shows no significant intra-articular pathology or rotator cuff pathology.  He presents now for operative management and evaluation after failure of conservative management.              ROS: All systems reviewed are negative as they relate to the chief complaint within the history of present illness.  Patient denies  fevers or chills.   Assessment & Plan: Visit Diagnoses:  1. AC separation, right, initial encounter     Plan: Impression is symptomatic at least grade 3 AC joint separation.  Clavicle is fairly unstable on examination.  I had a long talk with Shane Allison about operative and nonoperative management of this problem.  I told him that [facility] joint stabilization could restore his shoulder back to improved function but it will definitely not be pain-free.  The risk benefits including but not limited to infection nerve vessel damage prolonged rehabilitative process and potential for more surgery are discussed.  All questions answered.  Follow-Up Instructions: No follow-ups on file.   Orders:  No orders of the defined types were placed in this encounter.  No orders of the defined types were placed in this encounter.     Procedures: No procedures performed   Clinical Data: No additional findings.  Objective: Vital Signs: There were no  vitals taken for this visit.  Physical Exam:   Constitutional: Patient appears well-developed HEENT:  Head: Normocephalic Eyes:EOM are normal Neck: Normal range of motion Cardiovascular: Normal rate Pulmonary/chest: Effort normal Neurologic: Patient is alert Skin: Skin is warm Psychiatric: Patient has normal mood and affect    Ortho Exam: Ortho exam demonstrates fairly painful range of motion above 90 degrees of forward flexion abduction on the right-hand side.  The distal clavicle has both vertical and horizontal instability.  Rotator cuff strength is intact to infraspinatus supraspinatus and subscap muscle testing.  The patient is thin.  Motor or sensory function to the hand is intact.  Specialty Comments:  No specialty comments available.  Imaging: No results found.   PMFS History: Patient Active Problem List   Diagnosis Date Noted  . GERD (gastroesophageal reflux disease) 07/13/2016  . Tubular adenoma of rectum   . Diarrhea 03/03/2016  . Abnormal result of iron profile testing 03/03/2016  . Anemia 12/04/2015  . GI bleed 05/10/2015  . Syncope and collapse 05/10/2015  . RUQ abdominal pain 05/10/2015  . Alcohol abuse 05/10/2015  . COPD (chronic obstructive pulmonary disease) ([place]) 05/10/2015  . Acute pancreatitis 05/10/2015  . AKI (acute kidney injury) ([place]) 05/10/2015  . Hyponatremia 05/10/2015  . Forehead abrasion   . Radicular pain in right arm 05/14/2013  . Cervical spondylosis without myelopathy 05/14/2013  . Numbness and tingling  in hands 05/14/2013  . Contusion shoulder/arm 04/02/2013  . Partial tear of rotator cuff 04/02/2013   Past Medical History:  Diagnosis Date  . Alcohol use   . Asthma   . Cervical radiculopathy   . Chronic neck pain   . COPD (chronic obstructive pulmonary disease) ([place])   . GERD (gastroesophageal reflux disease)   . Hypertension   . Joint ache    Right AC joint separation  . Pneumonia   . Psoriasis   . Shortness of breath  dyspnea     Family History  Problem Relation Age of Onset  . COPD Mother   . Colon cancer Neg Hx     Past Surgical History:  Procedure Laterality Date  . ACROMIO-CLAVICULAR JOINT REPAIR Right 10/26/2017   Procedure: RIGHT ACROMIO-CLAVICULAR JOINT RECONSTRUCTION;  Surgeon: Meredith Pel, MD;  Location: [place];  Service: Orthopedics;  Laterality: Right;  . BIOPSY  05/12/2015   Procedure: BIOPSY;  Surgeon: Danie Binder, MD;  [facility];  Service: Endoscopy;;  gastric biopsy  . BIOPSY  03/15/2016   Procedure: BIOPSY;  Surgeon: Danie Binder, MD;  [facility];  Service: Endoscopy;;  random colon bx's  . COLONOSCOPY WITH PROPOFOL N/A 03/15/2016   Procedure: COLONOSCOPY WITH PROPOFOL;  Surgeon: Danie Binder, MD;  [facility];  Service: Endoscopy;  Laterality: N/A;  12:30 PM  . ESOPHAGOGASTRODUODENOSCOPY (EGD) WITH PROPOFOL N/A 05/12/2015   Dr. Oneida Alar: normal esophagus, gastritis, bleeding friable duodenal mucosa  . HAND TENDON SURGERY Right   . MULTIPLE TOOTH EXTRACTIONS    . POLYPECTOMY  03/15/2016   Procedure: POLYPECTOMY;  Surgeon: Danie Binder, MD;  [facility];  Service: Endoscopy;;  rectal polyp  . THUMB AMPUTATION     Partial left thumb    Social History   Occupational History  . Not on file  Tobacco Use  . Smoking status: Current Every Day Smoker    Packs/day: 1.50    Years: 40.00    Pack years: 60.00    Types: Cigarettes  . Smokeless tobacco: Never Used  Substance and Sexual Activity  . Alcohol use: Yes    Alcohol/week: 0.0 standard drinks    Comment: PATIENT REPORT 6 PACK OF BEER SEVERAL DAYS PER WEEK BUT NO EVERY DAY. (07/13/16)  . Drug use: No  . Sexual activity: Not Currently    Birth control/protection: None

## 2017-10-31 MED ORDER — SODIUM CHLORIDE 0.9 % IV SOLN: 4.0000 mg | Freq: Four times a day (QID) | INTRAVENOUS | Status: DC | PRN

## 2017-11-02 ENCOUNTER — Ambulatory Visit (INDEPENDENT_AMBULATORY_CARE_PROVIDER_SITE_OTHER): Payer: Medicaid Other | Admitting: Orthopedic Surgery

## 2017-11-02 ENCOUNTER — Encounter (INDEPENDENT_AMBULATORY_CARE_PROVIDER_SITE_OTHER): Payer: Self-pay | Admitting: Orthopedic Surgery

## 2017-11-02 ENCOUNTER — Ambulatory Visit (INDEPENDENT_AMBULATORY_CARE_PROVIDER_SITE_OTHER): Payer: Medicaid Other

## 2017-11-02 DIAGNOSIS — S43101A Unspecified dislocation of right acromioclavicular joint, initial encounter: Secondary | ICD-10-CM

## 2017-11-03 NOTE — Progress Notes (Signed)
Post-Op Visit Note   Patient: Shane Allison           Date of Birth: December 05, 1965           MRN: [id number] Visit Date: 11/02/2017 PCP: Sinda Du, MD   Assessment & Plan:  Chief Complaint:  Chief Complaint  Patient presents with  . Right Shoulder - Follow-up   Visit Diagnoses:  1. AC separation, right, initial encounter     Plan: Daquawn presents a week out right coracoclavicular ligament reconstruction.  This was done with tight rope as well as biologic fixation with transferred coracoacromial ligament.  He is doing well.  On examination the shoulder looks symmetric.  Deltoid fires.  Incision intact.  Radiographs look good.  I will keep him in the sling for 2 weeks.  Come out of the sling once to twice a day for elbow range of motion.  We will start him with some pendulums and shoulder range of motion in 2 weeks on return visit.  Follow-Up Instructions: Return in about 2 weeks (around 11/16/2017).   Orders:  Orders Placed This Encounter  Procedures  . XR AC Joints   No orders of the defined types were placed in this encounter.   Imaging: Xr Ac Joints  Result Date: 11/03/2017 [facility] bilateral AC joints reviewed.  Acromiohumeral distance intact.  Coracoclavicular distance between the operative right shoulder and nonoperative left shoulder appear generally symmetric.  Endobutton hardware fixation present on the superior aspect of the clavicle and inferior aspect of the coracoid.   PMFS History: Patient Active Problem List   Diagnosis Date Noted  . GERD (gastroesophageal reflux disease) 07/13/2016  . Tubular adenoma of rectum   . Diarrhea 03/03/2016  . Abnormal result of iron profile testing 03/03/2016  . Anemia 12/04/2015  . GI bleed 05/10/2015  . Syncope and collapse 05/10/2015  . RUQ abdominal pain 05/10/2015  . Alcohol abuse 05/10/2015  . COPD (chronic obstructive pulmonary disease) ([place]) 05/10/2015  . Acute pancreatitis 05/10/2015  . AKI (acute kidney  injury) ([place]) 05/10/2015  . Hyponatremia 05/10/2015  . Forehead abrasion   . Radicular pain in right arm 05/14/2013  . Cervical spondylosis without myelopathy 05/14/2013  . Numbness and tingling in hands 05/14/2013  . Contusion shoulder/arm 04/02/2013  . Partial tear of rotator cuff 04/02/2013   Past Medical History:  Diagnosis Date  . Alcohol use   . Asthma   . Cervical radiculopathy   . Chronic neck pain   . COPD (chronic obstructive pulmonary disease) ([place])   . GERD (gastroesophageal reflux disease)   . Hypertension   . Joint ache    Right AC joint separation  . Pneumonia   . Psoriasis   . Shortness of breath dyspnea     Family History  Problem Relation Age of Onset  . COPD Mother   . Colon cancer Neg Hx     Past Surgical History:  Procedure Laterality Date  . ACROMIO-CLAVICULAR JOINT REPAIR Right 10/26/2017   Procedure: RIGHT ACROMIO-CLAVICULAR JOINT RECONSTRUCTION;  Surgeon: Meredith Pel, MD;  Location: [place];  Service: Orthopedics;  Laterality: Right;  . BIOPSY  05/12/2015   Procedure: BIOPSY;  Surgeon: Danie Binder, MD;  [facility];  Service: Endoscopy;;  gastric biopsy  . BIOPSY  03/15/2016   Procedure: BIOPSY;  Surgeon: Danie Binder, MD;  [facility];  Service: Endoscopy;;  random colon bx's  . COLONOSCOPY WITH PROPOFOL N/A 03/15/2016   Procedure: COLONOSCOPY WITH PROPOFOL;  Surgeon: Danie Binder, MD;  [facility];  Service: Endoscopy;  Laterality: N/A;  12:30 PM  . ESOPHAGOGASTRODUODENOSCOPY (EGD) WITH PROPOFOL N/A 05/12/2015   Dr. Oneida Alar: normal esophagus, gastritis, bleeding friable duodenal mucosa  . HAND TENDON SURGERY Right   . MULTIPLE TOOTH EXTRACTIONS    . POLYPECTOMY  03/15/2016   Procedure: POLYPECTOMY;  Surgeon: Danie Binder, MD;  [facility];  Service: Endoscopy;;  rectal polyp  . THUMB AMPUTATION     Partial left thumb    Social History   Occupational History  . Not on file  Tobacco Use   . Smoking status: Current Every Day Smoker    Packs/day: 1.50    Years: 40.00    Pack years: 60.00    Types: Cigarettes  . Smokeless tobacco: Never Used  Substance and Sexual Activity  . Alcohol use: Yes    Alcohol/week: 0.0 standard drinks    Comment: PATIENT REPORT 6 PACK OF BEER SEVERAL DAYS PER WEEK BUT NO EVERY DAY. (07/13/16)  . Drug use: No  . Sexual activity: Not Currently    Birth control/protection: None

## 2017-11-17 ENCOUNTER — Ambulatory Visit (INDEPENDENT_AMBULATORY_CARE_PROVIDER_SITE_OTHER): Payer: Medicaid Other | Admitting: Orthopedic Surgery

## 2017-11-17 ENCOUNTER — Encounter (INDEPENDENT_AMBULATORY_CARE_PROVIDER_SITE_OTHER): Payer: Self-pay | Admitting: Orthopedic Surgery

## 2017-11-17 DIAGNOSIS — S43101A Unspecified dislocation of right acromioclavicular joint, initial encounter: Secondary | ICD-10-CM

## 2017-11-17 MED ORDER — HYDROCODONE-ACETAMINOPHEN 5-325 MG PO TABS: ORAL_TABLET | ORAL | 0 refills | Status: DC

## 2017-11-17 NOTE — Progress Notes (Signed)
Post-Op Visit Note   Patient: Shane Allison           Date of Birth: Jun 29, 1965           MRN: [id number] Visit Date: 11/17/2017 PCP: Sinda Du, MD   Assessment & Plan:  Chief Complaint:  Chief Complaint  Patient presents with  . Right Shoulder - Routine Post Op   Visit Diagnoses: No diagnosis found.  Plan: Shane Allison is now about 3 weeks out right [facility] joint reconstruction.  He still has some soreness.  Stop taking oxycodone.  Has taken hydrocodone in the past with good relief.  On examination the [facility] joint still looks well reduced.  Incision is intact.  I will have him start doing pendulum exercises 30 reps clockwise and counterclockwise 3 times a day.  Stay in sling but then in 3 weeks we will get him out of the sling and let him start doing a little bit more overhead motion.  Norco prescribed 1 every 12 45 with no refills.  Follow-Up Instructions: Return in about 3 weeks (around 12/08/2017).   Orders:  No orders of the defined types were placed in this encounter.  Meds ordered this encounter  Medications  . HYDROcodone-acetaminophen (NORCO/VICODIN) 5-325 MG tablet    Sig: 1 po q 12hrs prn pain    Dispense:  45 tablet    Refill:  0    Imaging: No results found.  PMFS History: Patient Active Problem List   Diagnosis Date Noted  . GERD (gastroesophageal reflux disease) 07/13/2016  . Tubular adenoma of rectum   . Diarrhea 03/03/2016  . Abnormal result of iron profile testing 03/03/2016  . Anemia 12/04/2015  . GI bleed 05/10/2015  . Syncope and collapse 05/10/2015  . RUQ abdominal pain 05/10/2015  . Alcohol abuse 05/10/2015  . COPD (chronic obstructive pulmonary disease) ([place]) 05/10/2015  . Acute pancreatitis 05/10/2015  . AKI (acute kidney injury) ([place]) 05/10/2015  . Hyponatremia 05/10/2015  . Forehead abrasion   . Radicular pain in right arm 05/14/2013  . Cervical spondylosis without myelopathy 05/14/2013  . Numbness and tingling in hands 05/14/2013  .  Contusion shoulder/arm 04/02/2013  . Partial tear of rotator cuff 04/02/2013   Past Medical History:  Diagnosis Date  . Alcohol use   . Asthma   . Cervical radiculopathy   . Chronic neck pain   . COPD (chronic obstructive pulmonary disease) ([place])   . GERD (gastroesophageal reflux disease)   . Hypertension   . Joint ache    Right AC joint separation  . Pneumonia   . Psoriasis   . Shortness of breath dyspnea     Family History  Problem Relation Age of Onset  . COPD Mother   . Colon cancer Neg Hx     Past Surgical History:  Procedure Laterality Date  . ACROMIO-CLAVICULAR JOINT REPAIR Right 10/26/2017   Procedure: RIGHT ACROMIO-CLAVICULAR JOINT RECONSTRUCTION;  Surgeon: Meredith Pel, MD;  Location: [place];  Service: Orthopedics;  Laterality: Right;  . BIOPSY  05/12/2015   Procedure: BIOPSY;  Surgeon: Danie Binder, MD;  [facility];  Service: Endoscopy;;  gastric biopsy  . BIOPSY  03/15/2016   Procedure: BIOPSY;  Surgeon: Danie Binder, MD;  [facility];  Service: Endoscopy;;  random colon bx's  . COLONOSCOPY WITH PROPOFOL N/A 03/15/2016   Procedure: COLONOSCOPY WITH PROPOFOL;  Surgeon: Danie Binder, MD;  [facility];  Service: Endoscopy;  Laterality: N/A;  12:30  PM  . ESOPHAGOGASTRODUODENOSCOPY (EGD) WITH PROPOFOL N/A 05/12/2015   Dr. Oneida Alar: normal esophagus, gastritis, bleeding friable duodenal mucosa  . HAND TENDON SURGERY Right   . MULTIPLE TOOTH EXTRACTIONS    . POLYPECTOMY  03/15/2016   Procedure: POLYPECTOMY;  Surgeon: Danie Binder, MD;  [facility];  Service: Endoscopy;;  rectal polyp  . THUMB AMPUTATION     Partial left thumb    Social History   Occupational History  . Not on file  Tobacco Use  . Smoking status: Current Every Day Smoker    Packs/day: 1.50    Years: 40.00    Pack years: 60.00    Types: Cigarettes  . Smokeless tobacco: Never Used  Substance and Sexual Activity  . Alcohol use: Yes     Alcohol/week: 0.0 standard drinks    Comment: PATIENT REPORT 6 PACK OF BEER SEVERAL DAYS PER WEEK BUT NO EVERY DAY. (07/13/16)  . Drug use: No  . Sexual activity: Not Currently    Birth control/protection: None

## 2017-11-22 ENCOUNTER — Ambulatory Visit (HOSPITAL_COMMUNITY)
Admission: RE | Admit: 2017-11-22 | Discharge: 2017-11-22 | Disposition: A | Discharge status: Home / Self Care | Payer: Medicaid Other | Source: Ambulatory Visit | Attending: Neurosurgery | Admitting: Neurosurgery

## 2017-11-22 ENCOUNTER — Other Ambulatory Visit: Payer: Self-pay

## 2017-11-22 ENCOUNTER — Other Ambulatory Visit (HOSPITAL_COMMUNITY): Payer: Medicaid Other

## 2017-11-22 DIAGNOSIS — M4807 Spinal stenosis, lumbosacral region: Secondary | ICD-10-CM | POA: Diagnosis not present

## 2017-11-22 DIAGNOSIS — M48061 Spinal stenosis, lumbar region without neurogenic claudication: Secondary | ICD-10-CM | POA: Diagnosis not present

## 2017-11-22 DIAGNOSIS — M79605 Pain in left leg: Secondary | ICD-10-CM

## 2017-11-22 DIAGNOSIS — I700 Atherosclerosis of aorta: Secondary | ICD-10-CM | POA: Insufficient documentation

## 2017-11-22 DIAGNOSIS — M8938 Hypertrophy of bone, other site: Secondary | ICD-10-CM | POA: Insufficient documentation

## 2017-11-22 MED ORDER — IOPAMIDOL (ISOVUE-M 200) INJECTION 41%
20.0000 mL | Freq: Once | INTRAMUSCULAR | Status: AC
  Administered 2017-11-22: 20 mL via INTRATHECAL

## 2017-11-22 MED ORDER — DIAZEPAM 5 MG PO TABS
10.0000 mg | ORAL_TABLET | Freq: Once | ORAL | Status: AC
  Administered 2017-11-22: 10 mg via ORAL
  Filled 2017-11-22: qty 2

## 2017-11-22 MED ORDER — OXYCODONE HCL 5 MG PO TABS: 5.0000 mg | ORAL_TABLET | ORAL | Status: DC | PRN

## 2017-11-22 MED ORDER — DIAZEPAM 5 MG PO TABS
ORAL_TABLET | ORAL | Status: AC
  Filled 2017-11-22: qty 2

## 2017-11-22 MED ORDER — LIDOCAINE HCL (PF) 1 % IJ SOLN
5.0000 mL | Freq: Once | INTRAMUSCULAR | Status: AC
  Administered 2017-11-22: 5 mL via INTRADERMAL

## 2017-11-22 MED ORDER — ONDANSETRON HCL 4 MG/2ML IJ SOLN: 4.0000 mg | Freq: Four times a day (QID) | INTRAMUSCULAR | Status: DC | PRN

## 2017-11-22 NOTE — Op Note (Signed)
11/22/2017 Lumbar Myelogram  PATIENT:  Shane Allison is a 52 y.o. male  PRE-OPERATIVE DIAGNOSIS:  Lower extremity pain, lumbago  POST-OPERATIVE DIAGNOSIS:  same  PROCEDURE:  Lumbar Myelogram  SURGEON:  Nery Frappier  ANESTHESIA:   local LOCAL MEDICATIONS USED:  LIDOCAINE  Procedure Note: Shane Allison is a 52 y.o. male Was taken to the fluoroscopy suite and  positioned prone on the fluoroscopy table. His back was prepared and draped in a sterile manner. I infiltrated 6 cc into the lumbar region. I then introduced a spinal needle into the thecal sac at the L4/5 interlaminar space. I infiltrated 20cc of Isovue 200 into the thecal sac. Fluoroscopy showed the needle and contrast in the thecal sac. Shane Allison tolerated the procedure well. he Will be taken to CT for evaluation.     PATIENT DISPOSITION:  Short Stay

## 2017-11-22 NOTE — Discharge Instructions (Signed)
Myelogram, Care After °These instructions give you information about caring for yourself after your procedure. Your doctor may also give you more specific instructions. Call your doctor if you have any problems or questions after your procedure. °Follow these instructions at home: °· Drink enough fluid to keep your pee (urine) clear or pale yellow. °· Rest as told by your doctor. °· Lie flat with your head slightly raised (elevated). °· Do not bend, lift, or do any hard activities for 24-48 hours or as told by your doctor. °· Take over-the-counter and prescription medicines only as told by your doctor. °· Take care of and remove your bandage (dressing) as told by your doctor. °· Bathe or shower as told by your doctor. °Contact a health care provider if: °· You have a fever. °· You have a headache that lasts longer than 24 hours. °· You feel sick to your stomach (nauseous). °· You throw up (vomit). °· Your neck is stiff. °· Your legs feel numb. °· You cannot pee. °· You cannot poop (have a bowel movement). °· You have a rash. °· You are itchy or sneezing. °Get help right away if: °· You have new symptoms or your symptoms get worse. °· You have a seizure. °· You have trouble breathing. °This information is not intended to replace advice given to you by your health care provider. Make sure you discuss any questions you have with your health care provider. °Document Released: 11/17/2007 Document Revised: 10/08/2015 Document Reviewed: 11/20/2014 °Elsevier Interactive Patient Education © 2018 [employer] ° °

## 2017-12-08 ENCOUNTER — Encounter (INDEPENDENT_AMBULATORY_CARE_PROVIDER_SITE_OTHER): Payer: Self-pay | Admitting: Orthopedic Surgery

## 2017-12-08 ENCOUNTER — Ambulatory Visit (INDEPENDENT_AMBULATORY_CARE_PROVIDER_SITE_OTHER): Payer: Medicaid Other | Admitting: Orthopedic Surgery

## 2017-12-08 ENCOUNTER — Ambulatory Visit (INDEPENDENT_AMBULATORY_CARE_PROVIDER_SITE_OTHER): Payer: Medicaid Other

## 2017-12-08 DIAGNOSIS — S43101A Unspecified dislocation of right acromioclavicular joint, initial encounter: Secondary | ICD-10-CM | POA: Diagnosis not present

## 2017-12-08 MED ORDER — HYDROCODONE-ACETAMINOPHEN 5-325 MG PO TABS: ORAL_TABLET | ORAL | 0 refills | Status: DC

## 2017-12-08 NOTE — Progress Notes (Signed)
Post-Op Visit Note   Patient: Shane Allison           Date of Birth: Jul 20, 1965           MRN: [id number] Visit Date: 12/08/2017 PCP: Sinda Du, MD   Assessment & Plan:  Chief Complaint:  Chief Complaint  Patient presents with  . Right Shoulder - Follow-up   Visit Diagnoses:  1. AC separation, right, initial encounter     Plan: Shane Allison is a patient with right AC joint reconstruction.  This was on 10/26/2017.  Is been doing well.  On exam the [facility] joint remains reduced.  Radiographs look good.  I like for him to discontinue sling use.  No lifting for 4 weeks.  Norco refilled x1 and he will need to transition to non-narcotic pain medicine after that.  Follow-up in 4 weeks for clinical recheck only  Follow-Up Instructions: Return in about 4 weeks (around 01/05/2018).   Orders:  Orders Placed This Encounter  Procedures  . XR AC Joints   Meds ordered this encounter  Medications  . HYDROcodone-acetaminophen (NORCO/VICODIN) 5-325 MG tablet    Sig: 1 po q 12hrs prn pain    Dispense:  30 tablet    Refill:  0    Imaging: Xr Ac Joints  Result Date: 12/08/2017 [facility] bilateral AC joints reviewed.  Right AC joint coracoclavicular reconstruction is been performed with good symmetric restoration of the coracoclavicular distance   PMFS History: Patient Active Problem List   Diagnosis Date Noted  . GERD (gastroesophageal reflux disease) 07/13/2016  . Tubular adenoma of rectum   . Diarrhea 03/03/2016  . Abnormal result of iron profile testing 03/03/2016  . Anemia 12/04/2015  . GI bleed 05/10/2015  . Syncope and collapse 05/10/2015  . RUQ abdominal pain 05/10/2015  . Alcohol abuse 05/10/2015  . COPD (chronic obstructive pulmonary disease) ([place]) 05/10/2015  . Acute pancreatitis 05/10/2015  . AKI (acute kidney injury) ([place]) 05/10/2015  . Hyponatremia 05/10/2015  . Forehead abrasion   . Radicular pain in right arm 05/14/2013  . Cervical spondylosis without myelopathy  05/14/2013  . Numbness and tingling in hands 05/14/2013  . Contusion shoulder/arm 04/02/2013  . Partial tear of rotator cuff 04/02/2013   Past Medical History:  Diagnosis Date  . Alcohol use   . Asthma   . Cervical radiculopathy   . Chronic neck pain   . COPD (chronic obstructive pulmonary disease) ([place])   . GERD (gastroesophageal reflux disease)   . Hypertension   . Joint ache    Right AC joint separation  . Pneumonia   . Psoriasis   . Shortness of breath dyspnea     Family History  Problem Relation Age of Onset  . COPD Mother   . Colon cancer Neg Hx     Past Surgical History:  Procedure Laterality Date  . ACROMIO-CLAVICULAR JOINT REPAIR Right 10/26/2017   Procedure: RIGHT ACROMIO-CLAVICULAR JOINT RECONSTRUCTION;  Surgeon: Meredith Pel, MD;  Location: [place];  Service: Orthopedics;  Laterality: Right;  . BIOPSY  05/12/2015   Procedure: BIOPSY;  Surgeon: Danie Binder, MD;  [facility];  Service: Endoscopy;;  gastric biopsy  . BIOPSY  03/15/2016   Procedure: BIOPSY;  Surgeon: Danie Binder, MD;  [facility];  Service: Endoscopy;;  random colon bx's  . COLONOSCOPY WITH PROPOFOL N/A 03/15/2016   Procedure: COLONOSCOPY WITH PROPOFOL;  Surgeon: Danie Binder, MD;  [facility];  Service: Endoscopy;  Laterality:  N/A;  12:30 PM  . ESOPHAGOGASTRODUODENOSCOPY (EGD) WITH PROPOFOL N/A 05/12/2015   Dr. Oneida Alar: normal esophagus, gastritis, bleeding friable duodenal mucosa  . HAND TENDON SURGERY Right   . MULTIPLE TOOTH EXTRACTIONS    . POLYPECTOMY  03/15/2016   Procedure: POLYPECTOMY;  Surgeon: Danie Binder, MD;  [facility];  Service: Endoscopy;;  rectal polyp  . THUMB AMPUTATION     Partial left thumb    Social History   Occupational History  . Not on file  Tobacco Use  . Smoking status: Current Every Day Smoker    Packs/day: 1.50    Years: 40.00    Pack years: 60.00    Types: Cigarettes  . Smokeless tobacco: Never Used    Substance and Sexual Activity  . Alcohol use: Yes    Alcohol/week: 0.0 standard drinks    Comment: PATIENT REPORT 6 PACK OF BEER SEVERAL DAYS PER WEEK BUT NO EVERY DAY. (07/13/16)  . Drug use: No  . Sexual activity: Not Currently    Birth control/protection: None

## 2018-01-05 ENCOUNTER — Ambulatory Visit (INDEPENDENT_AMBULATORY_CARE_PROVIDER_SITE_OTHER): Payer: Medicaid Other

## 2018-01-05 ENCOUNTER — Encounter (INDEPENDENT_AMBULATORY_CARE_PROVIDER_SITE_OTHER): Payer: Self-pay | Admitting: Orthopedic Surgery

## 2018-01-05 ENCOUNTER — Ambulatory Visit (INDEPENDENT_AMBULATORY_CARE_PROVIDER_SITE_OTHER): Payer: Medicaid Other | Admitting: Orthopedic Surgery

## 2018-01-05 DIAGNOSIS — S43101A Unspecified dislocation of right acromioclavicular joint, initial encounter: Secondary | ICD-10-CM

## 2018-01-05 NOTE — Progress Notes (Signed)
Post-Op Visit Note   Patient: Shane Allison           Date of Birth: [birth year]           MRN: [id number] Visit Date: 01/05/2018 PCP: Sinda Du, MD   Assessment & Plan:  Chief Complaint:  Chief Complaint  Patient presents with  . Right Shoulder - Follow-up   Visit Diagnoses:  1. AC separation, right, initial encounter     Plan: Hardeep is now about 2-1/2 months out right [facility] joint reconstruction.  He has been doing recently well.  On exam he has excellent range of motion of the shoulder.  Incision is intact.  Radiographs look good.  He still has occasional discomfort.  He is more concerned today about his left leg and hip region.  As a side note I did look at the lumbar spine MRI hip MRI and lumbar spine myelogram and nothing really definitively explanatory is coming up to explain his left hip pain.  He has further issues we may consider intra-articular hip injection he is had multiple injections in his back.  Again from the studies not entirely clear what is going on with him.  Regarding the right shoulder is doing well and I will see him back in 8 weeks for final check.  Follow-Up Instructions: Return in about 8 weeks (around 03/02/2018).   Orders:  Orders Placed This Encounter  Procedures  . XR AC Joints   No orders of the defined types were placed in this encounter.   Imaging: Xr Ac Joints  Result Date: 01/05/2018 [facility] bilateral AC joints reviewed.  Coracoclavicular fixation has been performed with maintenance of the coracoclavicular distance.  No other complicating features on that right sided coracoclavicular ligament reconstruction.   PMFS History: Patient Active Problem List   Diagnosis Date Noted  . GERD (gastroesophageal reflux disease) 07/13/2016  . Tubular adenoma of rectum   . Diarrhea 03/03/2016  . Abnormal result of iron profile testing 03/03/2016  . Anemia 12/04/2015  . GI bleed 05/10/2015  . Syncope and collapse 05/10/2015  . RUQ abdominal  pain 05/10/2015  . Alcohol abuse 05/10/2015  . COPD (chronic obstructive pulmonary disease) ([place]) 05/10/2015  . Acute pancreatitis 05/10/2015  . AKI (acute kidney injury) ([place]) 05/10/2015  . Hyponatremia 05/10/2015  . Forehead abrasion   . Radicular pain in right arm 05/14/2013  . Cervical spondylosis without myelopathy 05/14/2013  . Numbness and tingling in hands 05/14/2013  . Contusion shoulder/arm 04/02/2013  . Partial tear of rotator cuff 04/02/2013   Past Medical History:  Diagnosis Date  . Alcohol use   . Asthma   . Cervical radiculopathy   . Chronic neck pain   . COPD (chronic obstructive pulmonary disease) ([place])   . GERD (gastroesophageal reflux disease)   . Hypertension   . Joint ache    Right AC joint separation  . Pneumonia   . Psoriasis   . Shortness of breath dyspnea     Family History  Problem Relation Age of Onset  . COPD Mother   . Colon cancer Neg Hx     Past Surgical History:  Procedure Laterality Date  . ACROMIO-CLAVICULAR JOINT REPAIR Right 10/26/2017   Procedure: RIGHT ACROMIO-CLAVICULAR JOINT RECONSTRUCTION;  Surgeon: Meredith Pel, MD;  Location: [place];  Service: Orthopedics;  Laterality: Right;  . BIOPSY  05/12/2015   Procedure: BIOPSY;  Surgeon: Danie Binder, MD;  [facility];  Service: Endoscopy;;  gastric biopsy  . BIOPSY  03/15/2016   Procedure: BIOPSY;  Surgeon: Danie Binder, MD;  [facility];  Service: Endoscopy;;  random colon bx's  . COLONOSCOPY WITH PROPOFOL N/A 03/15/2016   Procedure: COLONOSCOPY WITH PROPOFOL;  Surgeon: Danie Binder, MD;  [facility];  Service: Endoscopy;  Laterality: N/A;  12:30 PM  . ESOPHAGOGASTRODUODENOSCOPY (EGD) WITH PROPOFOL N/A 05/12/2015   Dr. Oneida Alar: normal esophagus, gastritis, bleeding friable duodenal mucosa  . HAND TENDON SURGERY Right   . MULTIPLE TOOTH EXTRACTIONS    . POLYPECTOMY  03/15/2016   Procedure: POLYPECTOMY;  Surgeon: Danie Binder, MD;  [facility];  Service: Endoscopy;;  rectal polyp  . THUMB AMPUTATION     Partial left thumb    Social History   Occupational History  . Not on file  Tobacco Use  . Smoking status: Current Every Day Smoker    Packs/day: 1.50    Years: 40.00    Pack years: 60.00    Types: Cigarettes  . Smokeless tobacco: Never Used  Substance and Sexual Activity  . Alcohol use: Yes    Alcohol/week: 0.0 standard drinks    Comment: PATIENT REPORT 6 PACK OF BEER SEVERAL DAYS PER WEEK BUT NO EVERY DAY. (07/13/16)  . Drug use: No  . Sexual activity: Not Currently    Birth control/protection: None

## 2018-01-24 ENCOUNTER — Encounter (INDEPENDENT_AMBULATORY_CARE_PROVIDER_SITE_OTHER): Payer: Self-pay | Admitting: Orthopedic Surgery

## 2018-01-24 ENCOUNTER — Ambulatory Visit (INDEPENDENT_AMBULATORY_CARE_PROVIDER_SITE_OTHER): Payer: Medicaid Other | Admitting: Orthopedic Surgery

## 2018-01-24 DIAGNOSIS — M25552 Pain in left hip: Secondary | ICD-10-CM

## 2018-01-24 MED ORDER — METHYLPREDNISOLONE ACETATE 40 MG/ML IJ SUSP: 40.0000 mg | Freq: Once | INTRAMUSCULAR | Status: DC

## 2018-01-24 NOTE — Addendum Note (Signed)
Addended by: Hortencia Pilar on: 01/24/2018 01:35 PM   Modules accepted: Orders

## 2018-01-24 NOTE — Addendum Note (Signed)
Addended by: Erich Montane on: 01/24/2018 04:18 PM   Modules accepted: Orders

## 2018-01-24 NOTE — Progress Notes (Addendum)
Office Visit Note   Allison: Shane Allison           Date of Birth: 06-24-1965           MRN: [id number] Visit Date: 01/24/2018 Requested by: Sinda Du, MD [address] [place], [place] [address] PCP: Sinda Du, MD  Subjective: Chief Complaint  Allison presents with  . Left Hip - Pain    HPI: Shane Allison with left hip pain.  He has pain in the back of his hip region trochanteric region and it radiates down to the foot.  He had a left hip MRI scan which was essentially unremarkable.  This was done in June.  Also had MRI scan of his back which showed some mild stenosis probably worse at L5-S1 on the left-hand side.  He is walking worse today than he was prior clinic visit.  He is doing well from his shoulder surgery.  It sounds like he has not had a discrete hip joint injection yet.  Taking Tylenol for pain.  Describes a constant sharp pain which is worse at night.  He has been walking with a limp.              ROS: All systems reviewed are negative as they relate to the chief complaint within the history of present illness.  Allison denies  fevers or chills.   Assessment & Plan: Visit Diagnoses: No diagnosis found.  Plan: Impression is left hip pain which could be from the back could be from the hip itself.  Does have groin pain at night.  I would expect to do a diagnostic and therapeutic left hip injection per Dr. Junius Roads.  We will see how he does with that and then decide where to proceed from there.  He has been seen by a back surgeon who really does not think it is coming from his back.  That would be Dr. Rolena Infante over at [facility].  In regards to the hip I would favor observation after this injection to see how he does.  We will plan for sacral MRI scan to evaluate for possible stress reaction or stress fracture in the sacrum.  Follow-Up Instructions: No follow-ups on file.   Orders:  No orders of the defined types were placed in this  encounter.  No orders of the defined types were placed in this encounter.     Procedures: No procedures performed   Clinical Data: No additional findings.  Objective: Vital Signs: There were no vitals taken for this visit.  Physical Exam:   Constitutional: Allison appears well-developed HEENT:  Head: Normocephalic Eyes:EOM are normal Neck: Normal range of motion Cardiovascular: Normal rate Pulmonary/chest: Effort normal Neurologic: Allison is alert Skin: Skin is warm Psychiatric: Allison has normal mood and affect    Ortho Exam: Ortho exam demonstrates full active and passive range of motion of the right hip.  On the left-hand side he does have some groin pain and posterior buttock pain with range of motion.  Hip flexion strength is slightly less at 4 out of 5 on the left compared to the right.  Pedal pulses palpable.  No other masses lymph adenopathy or skin changes noted in the hip region.  Does have some forward flexion pain and extension pain.  No paresthesias L1 S1 bilaterally.  Specialty Comments:  No specialty comments available.  Imaging: No results found.   PMFS History: Allison Active Problem List   Diagnosis Date Noted  . GERD (gastroesophageal reflux  disease) 07/13/2016  . Tubular adenoma of rectum   . Diarrhea 03/03/2016  . Abnormal result of iron profile testing 03/03/2016  . Anemia 12/04/2015  . GI bleed 05/10/2015  . Syncope and collapse 05/10/2015  . RUQ abdominal pain 05/10/2015  . Alcohol abuse 05/10/2015  . COPD (chronic obstructive pulmonary disease) ([place]) 05/10/2015  . Acute pancreatitis 05/10/2015  . AKI (acute kidney injury) ([place]) 05/10/2015  . Hyponatremia 05/10/2015  . Forehead abrasion   . Radicular pain in right arm 05/14/2013  . Cervical spondylosis without myelopathy 05/14/2013  . Numbness and tingling in hands 05/14/2013  . Contusion shoulder/arm 04/02/2013  . Partial tear of rotator cuff 04/02/2013   Past Medical History:   Diagnosis Date  . Alcohol use   . Asthma   . Cervical radiculopathy   . Chronic neck pain   . COPD (chronic obstructive pulmonary disease) ([place])   . GERD (gastroesophageal reflux disease)   . Hypertension   . Joint ache    Right AC joint separation  . Pneumonia   . Psoriasis   . Shortness of breath dyspnea     Family History  Problem Relation Age of Onset  . COPD Mother   . Colon cancer Neg Hx     Past Surgical History:  Procedure Laterality Date  . ACROMIO-CLAVICULAR JOINT REPAIR Right 10/26/2017   Procedure: RIGHT ACROMIO-CLAVICULAR JOINT RECONSTRUCTION;  Surgeon: Meredith Pel, MD;  Location: [place];  Service: Orthopedics;  Laterality: Right;  . BIOPSY  05/12/2015   Procedure: BIOPSY;  Surgeon: Danie Binder, MD;  [facility];  Service: Endoscopy;;  gastric biopsy  . BIOPSY  03/15/2016   Procedure: BIOPSY;  Surgeon: Danie Binder, MD;  [facility];  Service: Endoscopy;;  random colon bx's  . COLONOSCOPY WITH PROPOFOL N/A 03/15/2016   Procedure: COLONOSCOPY WITH PROPOFOL;  Surgeon: Danie Binder, MD;  [facility];  Service: Endoscopy;  Laterality: N/A;  12:30 PM  . ESOPHAGOGASTRODUODENOSCOPY (EGD) WITH PROPOFOL N/A 05/12/2015   Dr. Oneida Alar: normal esophagus, gastritis, bleeding friable duodenal mucosa  . HAND TENDON SURGERY Right   . MULTIPLE TOOTH EXTRACTIONS    . POLYPECTOMY  03/15/2016   Procedure: POLYPECTOMY;  Surgeon: Danie Binder, MD;  [facility];  Service: Endoscopy;;  rectal polyp  . THUMB AMPUTATION     Partial left thumb    Social History   Occupational History  . Not on file  Tobacco Use  . Smoking status: Current Every Day Smoker    Packs/day: 1.50    Years: 40.00    Pack years: 60.00    Types: Cigarettes  . Smokeless tobacco: Never Used  Substance and Sexual Activity  . Alcohol use: Yes    Alcohol/week: 0.0 standard drinks    Comment: Allison REPORT 6 PACK OF BEER SEVERAL DAYS PER WEEK BUT NO  EVERY DAY. (07/13/16)  . Drug use: No  . Sexual activity: Not Currently    Birth control/protection: None

## 2018-01-24 NOTE — Progress Notes (Signed)
Subjective: He is here for diagnostic/therapeutic left hip ultrasound-guided injection, intra-articular.  Objective: Pain on right anterior and posterior hip, pain with passive internal and external rotation.  Procedure: Ultrasound-guided right hip injection: After sterile prep with Betadine, injected 8 cc 1% lidocaine without epinephrine and 40 mg methylprednisolone using a 22-gauge spinal needle, passing the needle through the iliofemoral ligament into the femoral head/neck junction.  Injectate was seen filling the joint capsule.

## 2018-02-22 ENCOUNTER — Encounter (INDEPENDENT_AMBULATORY_CARE_PROVIDER_SITE_OTHER): Payer: Self-pay | Admitting: Orthopedic Surgery

## 2018-02-22 ENCOUNTER — Ambulatory Visit (INDEPENDENT_AMBULATORY_CARE_PROVIDER_SITE_OTHER): Payer: Medicaid Other | Admitting: Orthopedic Surgery

## 2018-02-22 ENCOUNTER — Other Ambulatory Visit (INDEPENDENT_AMBULATORY_CARE_PROVIDER_SITE_OTHER): Payer: Self-pay

## 2018-02-22 DIAGNOSIS — M25552 Pain in left hip: Secondary | ICD-10-CM

## 2018-02-22 MED ORDER — ACETAMINOPHEN-CODEINE #3 300-30 MG PO TABS: 1.0000 | ORAL_TABLET | Freq: Three times a day (TID) | ORAL | 0 refills | Status: DC | PRN

## 2018-02-22 NOTE — Progress Notes (Signed)
Office Visit Note   Patient: Shane Allison           Date of Birth: 1966/01/21           MRN: [id number] Visit Date: 02/22/2018 Requested by: Sinda Du, MD [address] [place], [place] [address] PCP: Sinda Du, MD  Subjective: Chief Complaint  Patient presents with  . Left Hip - Follow-up    HPI: Shane Allison is a patient with continued left hip pain.  He describes doing a split 2 years ago and had some pain at that time.  He did have asymptomatic months between that traumatic event and his current symptoms.  Had MRI scan of his lumbar spine in April 2019.  Also had MRI of the left hip in June 2019.  Some low-level possible stress reactions noted in the femoral neck at that time.  He reports continued pain which she localizes primarily to the ischio tuberosity region.  He is having difficulty walking.  Also reports some pain coming around to the groin.              ROS: All systems reviewed are negative as they relate to the chief complaint within the history of present illness.  Patient denies  fevers or chills.   Assessment & Plan: Visit Diagnoses: No diagnosis found.  Plan: Impression is persistent debilitating pain affecting the left hip region.  Spine surgeons have basically stated that there is nothing going on in the back.  I think that he could be having some type of stress reaction in the femoral neck which was present on his last MRI scan.  We need to see if that has progressed.  Also possibility is sacral stress fracture as well as hamstring attachment partial or complete rupture.  That would all be on the left-hand side.  MRI of the pelvis would give [place] good comparison to the right-hand side.  I will see him back after that study.  He is getting worse.  Tylenol 3 prescribed today.  Follow-Up Instructions: No follow-ups on file.   Orders:  No orders of the defined types were placed in this encounter.  No orders of the defined types were placed in this  encounter.     Procedures: No procedures performed   Clinical Data: No additional findings.  Objective: Vital Signs: There were no vitals taken for this visit.  Physical Exam:   Constitutional: Patient appears well-developed HEENT:  Head: Normocephalic Eyes:EOM are normal Neck: Normal range of motion Cardiovascular: Normal rate Pulmonary/chest: Effort normal Neurologic: Patient is alert Skin: Skin is warm Psychiatric: Patient has normal mood and affect    Ortho Exam: Ortho exam demonstrates antalgic gait to the left.  Does have groin pain with internal X rotation of that left leg.  Has a little bit of weakness with knee flexion on the left compared to the right.  He has mild sacral tenderness on the left compared to the right.  No nerve root tension signs.  Pedal pulses palpable.  Has good hip abduction and hip flexion strength bilaterally.  Specialty Comments:  No specialty comments available.  Imaging: No results found.   PMFS History: Patient Active Problem List   Diagnosis Date Noted  . GERD (gastroesophageal reflux disease) 07/13/2016  . Tubular adenoma of rectum   . Diarrhea 03/03/2016  . Abnormal result of iron profile testing 03/03/2016  . Anemia 12/04/2015  . GI bleed 05/10/2015  . Syncope and collapse 05/10/2015  . RUQ abdominal pain 05/10/2015  .  Alcohol abuse 05/10/2015  . COPD (chronic obstructive pulmonary disease) ([place]) 05/10/2015  . Acute pancreatitis 05/10/2015  . AKI (acute kidney injury) ([place]) 05/10/2015  . Hyponatremia 05/10/2015  . Forehead abrasion   . Radicular pain in right arm 05/14/2013  . Cervical spondylosis without myelopathy 05/14/2013  . Numbness and tingling in hands 05/14/2013  . Contusion shoulder/arm 04/02/2013  . Partial tear of rotator cuff 04/02/2013   Past Medical History:  Diagnosis Date  . Alcohol use   . Asthma   . Cervical radiculopathy   . Chronic neck pain   . COPD (chronic obstructive pulmonary disease)  ([place])   . GERD (gastroesophageal reflux disease)   . Hypertension   . Joint ache    Right AC joint separation  . Pneumonia   . Psoriasis   . Shortness of breath dyspnea     Family History  Problem Relation Age of Onset  . COPD Mother   . Colon cancer Neg Hx     Past Surgical History:  Procedure Laterality Date  . ACROMIO-CLAVICULAR JOINT REPAIR Right 10/26/2017   Procedure: RIGHT ACROMIO-CLAVICULAR JOINT RECONSTRUCTION;  Surgeon: Meredith Pel, MD;  Location: [place];  Service: Orthopedics;  Laterality: Right;  . BIOPSY  05/12/2015   Procedure: BIOPSY;  Surgeon: Danie Binder, MD;  [facility];  Service: Endoscopy;;  gastric biopsy  . BIOPSY  03/15/2016   Procedure: BIOPSY;  Surgeon: Danie Binder, MD;  [facility];  Service: Endoscopy;;  random colon bx's  . COLONOSCOPY WITH PROPOFOL N/A 03/15/2016   Procedure: COLONOSCOPY WITH PROPOFOL;  Surgeon: Danie Binder, MD;  [facility];  Service: Endoscopy;  Laterality: N/A;  12:30 PM  . ESOPHAGOGASTRODUODENOSCOPY (EGD) WITH PROPOFOL N/A 05/12/2015   Dr. Oneida Alar: normal esophagus, gastritis, bleeding friable duodenal mucosa  . HAND TENDON SURGERY Right   . MULTIPLE TOOTH EXTRACTIONS    . POLYPECTOMY  03/15/2016   Procedure: POLYPECTOMY;  Surgeon: Danie Binder, MD;  [facility];  Service: Endoscopy;;  rectal polyp  . THUMB AMPUTATION     Partial left thumb    Social History   Occupational History  . Not on file  Tobacco Use  . Smoking status: Current Every Day Smoker    Packs/day: 1.50    Years: 40.00    Pack years: 60.00    Types: Cigarettes  . Smokeless tobacco: Never Used  Substance and Sexual Activity  . Alcohol use: Yes    Alcohol/week: 0.0 standard drinks    Comment: PATIENT REPORT 6 PACK OF BEER SEVERAL DAYS PER WEEK BUT NO EVERY DAY. (07/13/16)  . Drug use: No  . Sexual activity: Not Currently    Birth control/protection: None

## 2018-03-07 ENCOUNTER — Ambulatory Visit
Admission: RE | Admit: 2018-03-07 | Discharge: 2018-03-07 | Disposition: A | Discharge status: Home / Self Care | Payer: Medicaid Other | Source: Ambulatory Visit | Attending: Orthopedic Surgery | Admitting: Orthopedic Surgery

## 2018-03-07 DIAGNOSIS — M25552 Pain in left hip: Secondary | ICD-10-CM

## 2018-03-26 ENCOUNTER — Encounter (INDEPENDENT_AMBULATORY_CARE_PROVIDER_SITE_OTHER): Payer: Self-pay | Admitting: Orthopedic Surgery

## 2018-03-26 ENCOUNTER — Ambulatory Visit (INDEPENDENT_AMBULATORY_CARE_PROVIDER_SITE_OTHER): Payer: Medicaid Other | Admitting: Orthopedic Surgery

## 2018-03-26 DIAGNOSIS — M25552 Pain in left hip: Secondary | ICD-10-CM

## 2018-03-27 ENCOUNTER — Encounter (INDEPENDENT_AMBULATORY_CARE_PROVIDER_SITE_OTHER): Payer: Self-pay | Admitting: Orthopedic Surgery

## 2018-03-27 NOTE — Progress Notes (Signed)
Office Visit Note   Patient: Shane Allison           Date of Birth: 1965-05-23           MRN: [id number] Visit Date: 03/26/2018 Requested by: Sinda Du, MD [address] [place], [place] [address] PCP: Sinda Du, MD  Subjective: Chief Complaint  Patient presents with  . Left Hip - Pain    HPI: Shane Allison is a patient with left hip and leg pain.  He has had extensive work-up detailed in prior notes.  In summary he has had a second MRI of the pelvis which was unremarkable in terms of hip pathology.  No definite effusion or significant arthritis in the left hip joint.  He has had a left hip injection which did not give him any relief.  Localizes the pain in the groin as well as in the trochanteric region.  Also states that he has burning pain which runs down the leg into his calf.  Hurts him some with coughing and sneezing.  He has had CT myelogram of the lumbar spine which does not show much in terms of left-sided pathology.  Patient states his symptoms are worse with walking.  Taking Tylenol for pain.              ROS: All systems reviewed are negative as they relate to the chief complaint within the history of present illness.  Patient denies  fevers or chills.   Assessment & Plan: Visit Diagnoses:  1. Pain of left hip joint     Plan: Impression is left hip joint and leg pain which does not really appear to be intra-articular pathology of the hip.  Does not appear to be hamstring tendinosis or tendinitis.  Hard to say if this is coming from his back.  He has had neurosurgical work-up and they referred him here.  I think that he may have some type of compression of the sciatic nerve around the hip joint.  This is really more less by process of illumination.  Would like for him to consult with Dr. Laurence Spates for evaluation and management.  Also consideration of nerve study to evaluate sciatic nerve compression around the hip joint piriformis tendon region.  I will see him  back after that consultation and possible EMG nerve study.  Follow-Up Instructions: No follow-ups on file.   Orders:  No orders of the defined types were placed in this encounter.  No orders of the defined types were placed in this encounter.     Procedures: No procedures performed   Clinical Data: No additional findings.  Objective: Vital Signs: There were no vitals taken for this visit.  Physical Exam:   Constitutional: Patient appears well-developed HEENT:  Head: Normocephalic Eyes:EOM are normal Neck: Normal range of motion Cardiovascular: Normal rate Pulmonary/chest: Effort normal Neurologic: Patient is alert Skin: Skin is warm Psychiatric: Patient has normal mood and affect    Ortho Exam: Ortho exam demonstrates antalgic gait to the left.  Leg length equal.  Pedal pulses palpable.  No groin pain with internal X rotation of the right leg.  Little groin pain with internal X rotation of the left.  Hip flexion abduction adduction ankle dorsiflexion plantarflexion strength 5+ out of 5 bilaterally.  Some pain with forward bending.  No other masses lymphadenopathy or skin changes noted in the left hip region.  Specialty Comments:  No specialty comments available.  Imaging: No results found.   PMFS History: Patient Active Problem List  Diagnosis Date Noted  . GERD (gastroesophageal reflux disease) 07/13/2016  . Tubular adenoma of rectum   . Diarrhea 03/03/2016  . Abnormal result of iron profile testing 03/03/2016  . Anemia 12/04/2015  . GI bleed 05/10/2015  . Syncope and collapse 05/10/2015  . RUQ abdominal pain 05/10/2015  . Alcohol abuse 05/10/2015  . COPD (chronic obstructive pulmonary disease) ([place]) 05/10/2015  . Acute pancreatitis 05/10/2015  . AKI (acute kidney injury) ([place]) 05/10/2015  . Hyponatremia 05/10/2015  . Forehead abrasion   . Radicular pain in right arm 05/14/2013  . Cervical spondylosis without myelopathy 05/14/2013  . Numbness and  tingling in hands 05/14/2013  . Contusion shoulder/arm 04/02/2013  . Partial tear of rotator cuff 04/02/2013   Past Medical History:  Diagnosis Date  . Alcohol use   . Asthma   . Cervical radiculopathy   . Chronic neck pain   . COPD (chronic obstructive pulmonary disease) ([place])   . GERD (gastroesophageal reflux disease)   . Hypertension   . Joint ache    Right AC joint separation  . Pneumonia   . Psoriasis   . Shortness of breath dyspnea     Family History  Problem Relation Age of Onset  . COPD Mother   . Colon cancer Neg Hx     Past Surgical History:  Procedure Laterality Date  . ACROMIO-CLAVICULAR JOINT REPAIR Right 10/26/2017   Procedure: RIGHT ACROMIO-CLAVICULAR JOINT RECONSTRUCTION;  Surgeon: Meredith Pel, MD;  Location: [facility];  Service: Orthopedics;  Laterality: Right;  . BIOPSY  05/12/2015   Procedure: BIOPSY;  Surgeon: Danie Binder, MD;  [facility];  Service: Endoscopy;;  gastric biopsy  . BIOPSY  03/15/2016   Procedure: BIOPSY;  Surgeon: Danie Binder, MD;  [facility];  Service: Endoscopy;;  random colon bx's  . COLONOSCOPY WITH PROPOFOL N/A 03/15/2016   Procedure: COLONOSCOPY WITH PROPOFOL;  Surgeon: Danie Binder, MD;  [facility];  Service: Endoscopy;  Laterality: N/A;  12:30 PM  . ESOPHAGOGASTRODUODENOSCOPY (EGD) WITH PROPOFOL N/A 05/12/2015   Dr. Oneida Alar: normal esophagus, gastritis, bleeding friable duodenal mucosa  . HAND TENDON SURGERY Right   . MULTIPLE TOOTH EXTRACTIONS    . POLYPECTOMY  03/15/2016   Procedure: POLYPECTOMY;  Surgeon: Danie Binder, MD;  [facility];  Service: Endoscopy;;  rectal polyp  . THUMB AMPUTATION     Partial left thumb    Social History   Occupational History  . Not on file  Tobacco Use  . Smoking status: Current Every Day Smoker    Packs/day: 1.50    Years: 40.00    Pack years: 60.00    Types: Cigarettes  . Smokeless tobacco: Never Used  Substance and Sexual  Activity  . Alcohol use: Yes    Alcohol/week: 0.0 standard drinks    Comment: PATIENT REPORT 6 PACK OF BEER SEVERAL DAYS PER WEEK BUT NO EVERY DAY. (07/13/16)  . Drug use: No  . Sexual activity: Not Currently    Birth control/protection: None

## 2018-08-10 ENCOUNTER — Emergency Department (HOSPITAL_COMMUNITY): Payer: Medicaid Other

## 2018-08-10 ENCOUNTER — Emergency Department (HOSPITAL_COMMUNITY)
Admission: EM | Admit: 2018-08-10 | Discharge: 2018-08-10 | Disposition: A | Discharge status: Home / Self Care | Payer: Medicaid Other | Attending: Emergency Medicine | Admitting: Emergency Medicine

## 2018-08-10 ENCOUNTER — Encounter (HOSPITAL_COMMUNITY): Payer: Self-pay

## 2018-08-10 ENCOUNTER — Other Ambulatory Visit: Payer: Self-pay

## 2018-08-10 DIAGNOSIS — I10 Essential (primary) hypertension: Secondary | ICD-10-CM | POA: Diagnosis not present

## 2018-08-10 DIAGNOSIS — J449 Chronic obstructive pulmonary disease, unspecified: Secondary | ICD-10-CM | POA: Insufficient documentation

## 2018-08-10 DIAGNOSIS — Z880 Allergy status to penicillin: Secondary | ICD-10-CM | POA: Diagnosis not present

## 2018-08-10 DIAGNOSIS — M5412 Radiculopathy, cervical region: Secondary | ICD-10-CM | POA: Insufficient documentation

## 2018-08-10 DIAGNOSIS — F17210 Nicotine dependence, cigarettes, uncomplicated: Secondary | ICD-10-CM | POA: Insufficient documentation

## 2018-08-10 DIAGNOSIS — J45909 Unspecified asthma, uncomplicated: Secondary | ICD-10-CM | POA: Insufficient documentation

## 2018-08-10 DIAGNOSIS — M25512 Pain in left shoulder: Secondary | ICD-10-CM | POA: Diagnosis not present

## 2018-08-10 MED ORDER — KETOROLAC TROMETHAMINE 30 MG/ML IJ SOLN
30.0000 mg | Freq: Once | INTRAMUSCULAR | Status: AC
  Administered 2018-08-10: 30 mg via INTRAMUSCULAR
  Filled 2018-08-10: qty 1

## 2018-08-10 MED ORDER — DICLOFENAC SODIUM 1 % TD GEL: 2.0000 g | Freq: Four times a day (QID) | TRANSDERMAL | 0 refills | Status: DC | PRN

## 2018-08-10 MED ORDER — KETOROLAC TROMETHAMINE 10 MG PO TABS: 10.0000 mg | ORAL_TABLET | Freq: Four times a day (QID) | ORAL | 0 refills | Status: DC | PRN

## 2018-08-10 NOTE — ED Provider Notes (Signed)
Emergency Department Provider Note   I have reviewed the triage vital signs and the nursing notes.   HISTORY  Chief Complaint Shoulder Pain   HPI Shane Allison is a 53 y.o. male with past medical history listed below presents to the emergency department for evaluation of left shoulder pain.  2 weeks ago the patient was working on a roof when he nearly fell.  He was able to grab on and push himself up but since that time is had severe pain in his left shoulder.  He describes pain in the shoulder blade radiating to the neck and into the upper arm.  Denies any tingling, numbness, weakness in the left arm.  No pain in the elbow or wrist.  He does have history of AC separation on the right which required operative repair by Dr. Marlou Sa last year.  He denies any head trauma.  Pain worse with movement.    Past Medical History:  Diagnosis Date  . Alcohol use   . Asthma   . Cervical radiculopathy   . Chronic neck pain   . COPD (chronic obstructive pulmonary disease) ([place])   . GERD (gastroesophageal reflux disease)   . Hypertension   . Joint ache    Right AC joint separation  . Pneumonia   . Psoriasis   . Shortness of breath dyspnea     Patient Active Problem List   Diagnosis Date Noted  . GERD (gastroesophageal reflux disease) 07/13/2016  . Tubular adenoma of rectum   . Diarrhea 03/03/2016  . Abnormal result of iron profile testing 03/03/2016  . Anemia 12/04/2015  . GI bleed 05/10/2015  . Syncope and collapse 05/10/2015  . RUQ abdominal pain 05/10/2015  . Alcohol abuse 05/10/2015  . COPD (chronic obstructive pulmonary disease) ([place]) 05/10/2015  . Acute pancreatitis 05/10/2015  . AKI (acute kidney injury) ([place]) 05/10/2015  . Hyponatremia 05/10/2015  . Forehead abrasion   . Radicular pain in right arm 05/14/2013  . Cervical spondylosis without myelopathy 05/14/2013  . Numbness and tingling in hands 05/14/2013  . Contusion shoulder/arm 04/02/2013  . Partial tear of rotator  cuff 04/02/2013    Past Surgical History:  Procedure Laterality Date  . ACROMIO-CLAVICULAR JOINT REPAIR Right 10/26/2017   Procedure: RIGHT ACROMIO-CLAVICULAR JOINT RECONSTRUCTION;  Surgeon: Meredith Pel, MD;  Location: [facility];  Service: Orthopedics;  Laterality: Right;  . BIOPSY  05/12/2015   Procedure: BIOPSY;  Surgeon: Danie Binder, MD;  [facility];  Service: Endoscopy;;  gastric biopsy  . BIOPSY  03/15/2016   Procedure: BIOPSY;  Surgeon: Danie Binder, MD;  [facility];  Service: Endoscopy;;  random colon bx's  . COLONOSCOPY WITH PROPOFOL N/A 03/15/2016   Procedure: COLONOSCOPY WITH PROPOFOL;  Surgeon: Danie Binder, MD;  [facility];  Service: Endoscopy;  Laterality: N/A;  12:30 PM  . ESOPHAGOGASTRODUODENOSCOPY (EGD) WITH PROPOFOL N/A 05/12/2015   Dr. Oneida Alar: normal esophagus, gastritis, bleeding friable duodenal mucosa  . HAND TENDON SURGERY Right   . MULTIPLE TOOTH EXTRACTIONS    . POLYPECTOMY  03/15/2016   Procedure: POLYPECTOMY;  Surgeon: Danie Binder, MD;  [facility];  Service: Endoscopy;;  rectal polyp  . THUMB AMPUTATION     Partial left thumb     Allergies Penicillins  Family History  Problem Relation Age of Onset  . COPD Mother   . Colon cancer Neg Hx     Social History Social History   Tobacco Use  .  Smoking status: Current Every Day Smoker    Packs/day: 1.50    Years: 40.00    Pack years: 60.00    Types: Cigarettes  . Smokeless tobacco: Never Used  Substance Use Topics  . Alcohol use: Yes    Alcohol/week: 0.0 standard drinks    Comment: PATIENT REPORT 6 PACK OF BEER SEVERAL DAYS PER WEEK BUT NO EVERY DAY. (07/13/16)  . Drug use: No    Review of Systems  Constitutional: No fever/chills Eyes: No visual changes. ENT: No sore throat. Cardiovascular: Denies chest pain. Respiratory: Denies shortness of breath. Gastrointestinal: No abdominal pain.  No nausea, no vomiting.  No diarrhea.  No  constipation. Genitourinary: Negative for dysuria. Musculoskeletal: Positive left shoulder pain.  Skin: Negative for rash. Neurological: Negative for headaches, focal weakness or numbness.  10-point ROS otherwise negative.  ____________________________________________   PHYSICAL EXAM:  VITAL SIGNS: ED Triage Vitals  Enc Vitals Group     BP 08/10/18 0805 (!) 148/92     Pulse Rate 08/10/18 0805 80     Resp 08/10/18 0805 16     Temp 08/10/18 0805 98 F (36.7 C)     Temp Source 08/10/18 0805 Oral     SpO2 08/10/18 0805 100 %     Weight 08/10/18 0802 140 lb (63.5 kg)     Height 08/10/18 0802 5\' 7"  (1.702 m)     Pain Score 08/10/18 0802 10   Constitutional: Alert and oriented. Well appearing and in no acute distress. Eyes: Conjunctivae are normal.  Head: Atraumatic. Nose: No congestion/rhinnorhea. Mouth/Throat: Mucous membranes are moist. Neck: No stridor.  No cervical spine tenderness to palpation. Cardiovascular: Normal rate, regular rhythm. Respiratory: Normal respiratory effort.  Gastrointestinal: No distention.  Musculoskeletal: Pain in the left shoulder with abduction of the joint.  Difficulty moving past 90 degrees.  No crepitus or clear deformity.  No tenderness over the clavicle.  No elbow or wrist tenderness on the left.  Neurologic:  Normal speech and language. No gross focal neurologic deficits are appreciated.  Skin:  Skin is warm, dry and intact. No rash noted.  ____________________________________________  RADIOLOGY  Ct Cervical Spine Wo Contrast  Result Date: 08/10/2018 CLINICAL DATA:  LEFT shoulder pain for 2 weeks after catching himself from almost falling over roof, trauma EXAM: CT CERVICAL SPINE WITHOUT CONTRAST TECHNIQUE: Multidetector CT imaging of the cervical spine was performed without intravenous contrast. Multiplanar CT image reconstructions were also generated. COMPARISON:  05/10/2015 FINDINGS: Alignment: Normal Skull base and vertebrae: Osseous  mineralization normal. Skull base intact. 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 Disc space narrowing and endplate spur formation C4-C5 through C6-C7. Encroachment upon cervical neural foramina at multiple levels by uncovertebral spurs, greatest at LEFT C5-C6 and C4-C5. Mild facet degenerative changes. No fracture, subluxation, or bone destruction. Soft tissues and spinal canal: Prevertebral soft tissues normal thickness Disc levels:  No additional abnormalities Upper chest: Lung apices clear Other: Atherosclerotic calcifications at the carotid bifurcations and proximal great vessels IMPRESSION: Degenerative disc and facet disease changes of the cervical spine. Encroachment upon cervical neural foramina at multiple levels by uncovertebral spur formation greatest at LEFT C5-C6 and C4-C5. No acute abnormalities. Electronically Signed   By: Lavonia Dana M.D.   On: 08/10/2018 09:50   Dg Shoulder Left  Result Date: 08/10/2018 CLINICAL DATA:  Left shoulder pain after falling off a roof 2 weeks ago. EXAM: LEFT SHOULDER - 2+ VIEW COMPARISON:  11/04/2015. FINDINGS: There is no evidence of fracture  or dislocation. There is no evidence of arthropathy or other focal bone abnormality. Soft tissues are unremarkable. IMPRESSION: Normal examination. Electronically Signed   By: Claudie Revering M.D.   On: 08/10/2018 09:00    ____________________________________________   PROCEDURES  Procedure(s) performed:   Procedures  None ____________________________________________   INITIAL IMPRESSION / ASSESSMENT AND PLAN / ED COURSE  Pertinent labs & imaging results that were available during my care of the patient were reviewed by me and considered in my medical decision making (see chart for details).   Patient presents to the emergency department for evaluation of left shoulder pain after injury 2 weeks ago.  Plan for plain film of the left shoulder and CT imaging of the cervical  spine.  Some radicular symptoms noted on history.  Patient does have listed history of this.  No focal weakness but will evaluate further.  May need follow-up with orthopedics.  Plain film and CT reviewed no acute findings.  Patient advised to call his orthopedist, Dr. Marlou Sa, for additional follow-up and possible MRI of the shoulder.  Phone number provided. ____________________________________________  FINAL CLINICAL IMPRESSION(S) / ED DIAGNOSES  Final diagnoses:  Acute pain of left shoulder  Cervical radiculopathy     MEDICATIONS GIVEN DURING THIS VISIT:  Medications  ketorolac (TORADOL) 30 MG/ML injection 30 mg (30 mg Intramuscular Given 08/10/18 0827)     NEW OUTPATIENT MEDICATIONS STARTED DURING THIS VISIT:  Discharge Medication List as of 08/10/2018 10:02 AM    START taking these medications   Details  diclofenac sodium (VOLTAREN) 1 % GEL Apply 2 g topically 4 (four) times daily as needed (shoulder pain)., Starting Fri 08/10/2018, Print    ketorolac (TORADOL) 10 MG tablet Take 1 tablet (10 mg total) by mouth every 6 (six) hours as needed for severe pain., Starting Fri 08/10/2018, Print        Note:  This document was prepared using Dragon voice recognition software and may include unintentional dictation errors.  Nanda Quinton, MD Emergency Medicine    Leone Putman, [facility], MD 08/10/18 2019

## 2018-08-10 NOTE — ED Triage Notes (Signed)
Pt is having left shoulder pain that started 2 weeks ago after catching himself from almost falling off a roof. Is able to move shoulder, but states a constant aching. Nad

## 2018-08-10 NOTE — Discharge Instructions (Signed)
We believe that your symptoms are caused by musculoskeletal strain.  Please read through the included information about additional care such as heating pads, over-the-counter pain medicine.  If you were provided a prescription please use it only as needed and as instructed.  Remember that early mobility and using the affected part of your body is actually better than keeping it immobile.  You should call your orthopedic surgeon today to schedule an outpatient appointment.  The left shoulder may need an MRI if conservative treatments at home do not improve your symptoms.   Follow-up with the doctor listed as recommended or return to the emergency department with new or worsening symptoms that concern you.

## 2018-08-15 ENCOUNTER — Encounter: Payer: Self-pay | Admitting: Orthopedic Surgery

## 2018-08-15 ENCOUNTER — Ambulatory Visit (INDEPENDENT_AMBULATORY_CARE_PROVIDER_SITE_OTHER): Payer: Medicaid Other | Admitting: Orthopedic Surgery

## 2018-08-15 ENCOUNTER — Other Ambulatory Visit: Payer: Self-pay

## 2018-08-15 DIAGNOSIS — M542 Cervicalgia: Secondary | ICD-10-CM

## 2018-08-16 ENCOUNTER — Encounter: Payer: Self-pay | Admitting: Orthopedic Surgery

## 2018-08-16 NOTE — Progress Notes (Signed)
Office Visit Note   Patient: Shane Allison           Date of Birth: 1965/03/27           MRN: [id number] Visit Date: 08/15/2018 Requested by: Sinda Du, MD [address] [place],  [place] [address] PCP: Sinda Du, MD  Subjective: Chief Complaint  Patient presents with  . Left Shoulder - Pain    F/u ER 08/10/2018    HPI: Shane Allison is a 53 year old patient with left shoulder pain.  He almost fell off a roof on 08/10/2018.  CT scan of the neck did show some degenerative changes but no acute fracture.  He has been advised that he would need an MRI scan.  Extra strength Tylenol has not been helpful.  He reports pain radiating down the anterior aspect of the arm into the forearm.  He also reports shoulder blade pain.  Heating pad is used as needed.  Symptoms are better when his arm is elevated.  He did have right shoulder surgery in September of last year.              ROS: All systems reviewed are negative as they relate to the chief complaint within the history of present illness.  Patient denies  fevers or chills.   Assessment & Plan: Visit Diagnoses:  1. Cervicalgia     Plan: Impression is cervicalgia possible left-sided radiculopathy following fairly violent fall from a house.  Has fairly reasonable rotator cuff strength at this time.  Does have definite radicular symptoms but no real weakness.  Based on the severe nature of his pain as well as equivocal findings on cervical spine CT scan I would favor MRI cervical spine to evaluate left-sided radiculopathy.  Follow-up after that study  Follow-Up Instructions: Return for after MRI.   Orders:  Orders Placed This Encounter  Procedures  . MR Cervical Spine w/o contrast   No orders of the defined types were placed in this encounter.     Procedures: No procedures performed   Clinical Data: No additional findings.  Objective: Vital Signs: Ht 5\' 7"  (1.702 m)   Wt 140 lb (63.5 kg)   BMI 21.93 kg/m   Physical  Exam:   Constitutional: Patient appears well-developed HEENT:  Head: Normocephalic Eyes:EOM are normal Neck: Normal range of motion Cardiovascular: Normal rate Pulmonary/chest: Effort normal Neurologic: Patient is alert Skin: Skin is warm Psychiatric: Patient has normal mood and affect    Ortho Exam: Ortho exam demonstrates normal gait alignment.  Has pretty reasonable cervical spine range of motion but some pain in the left arm with rotation to the left.  Patient has 5 out of 5 grip EPL FPL interosseous wrist flexion extension bicep triceps and deltoid strength with palpable radial pulse bilaterally.  No definite paresthesias C5-T1 but he does report the pain in the C5-6 distribution on the left-hand side.  Reflexes symmetric bilateral biceps triceps.  Shoulder range of motion is good with good rotator cuff strength no coarse grinding or crepitus with passive range of motion of that left shoulder.  Specialty Comments:  No specialty comments available.  Imaging: No results found.   PMFS History: Patient Active Problem List   Diagnosis Date Noted  . GERD (gastroesophageal reflux disease) 07/13/2016  . Tubular adenoma of rectum   . Diarrhea 03/03/2016  . Abnormal result of iron profile testing 03/03/2016  . Anemia 12/04/2015  . GI bleed 05/10/2015  . Syncope and collapse 05/10/2015  . RUQ abdominal pain  05/10/2015  . Alcohol abuse 05/10/2015  . COPD (chronic obstructive pulmonary disease) ([facility] [facility]) 05/10/2015  . Acute pancreatitis 05/10/2015  . AKI (acute kidney injury) ([place]) 05/10/2015  . Hyponatremia 05/10/2015  . Forehead abrasion   . Radicular pain in right arm 05/14/2013  . Cervical spondylosis without myelopathy 05/14/2013  . Numbness and tingling in hands 05/14/2013  . Contusion shoulder/arm 04/02/2013  . Partial tear of rotator cuff 04/02/2013   Past Medical History:  Diagnosis Date  . Alcohol use   . Asthma   . Cervical radiculopathy   . Chronic neck pain   .  COPD (chronic obstructive pulmonary disease) ([facility])   . GERD (gastroesophageal reflux disease)   . Hypertension   . Joint ache    Right AC joint separation  . Pneumonia   . Psoriasis   . Shortness of breath dyspnea     Family History  Problem Relation Age of Onset  . COPD Mother   . Colon cancer Neg Hx     Past Surgical History:  Procedure Laterality Date  . ACROMIO-CLAVICULAR JOINT REPAIR Right 10/26/2017   Procedure: RIGHT ACROMIO-CLAVICULAR JOINT RECONSTRUCTION;  Surgeon: Meredith Pel, MD;  Location: [facility];  Service: Orthopedics;  Laterality: Right;  . BIOPSY  05/12/2015   Procedure: BIOPSY;  Surgeon: Danie Binder, MD;  [facility];  Service: Endoscopy;;  gastric biopsy  . BIOPSY  03/15/2016   Procedure: BIOPSY;  Surgeon: Danie Binder, MD;  [facility];  Service: Endoscopy;;  random colon bx's  . COLONOSCOPY WITH PROPOFOL N/A 03/15/2016   Procedure: COLONOSCOPY WITH PROPOFOL;  Surgeon: Danie Binder, MD;  [facility];  Service: Endoscopy;  Laterality: N/A;  12:30 PM  . ESOPHAGOGASTRODUODENOSCOPY (EGD) WITH PROPOFOL N/A 05/12/2015   Dr. Oneida Alar: normal esophagus, gastritis, bleeding friable duodenal mucosa  . HAND TENDON SURGERY Right   . MULTIPLE TOOTH EXTRACTIONS    . POLYPECTOMY  03/15/2016   Procedure: POLYPECTOMY;  Surgeon: Danie Binder, MD;  [facility];  Service: Endoscopy;;  rectal polyp  . THUMB AMPUTATION     Partial left thumb    Social History   Occupational History  . Not on file  Tobacco Use  . Smoking status: Current Every Day Smoker    Packs/day: 1.50    Years: 40.00    Pack years: 60.00    Types: Cigarettes  . Smokeless tobacco: Never Used  Substance and Sexual Activity  . Alcohol use: Yes    Alcohol/week: 0.0 standard drinks    Comment: PATIENT REPORT 6 PACK OF BEER SEVERAL DAYS PER WEEK BUT NO EVERY DAY. (07/13/16)  . Drug use: No  . Sexual activity: Not Currently    Birth control/protection:  None

## 2018-08-29 ENCOUNTER — Ambulatory Visit: Payer: Medicaid Other | Admitting: Orthopedic Surgery

## 2018-09-14 ENCOUNTER — Other Ambulatory Visit: Payer: Medicaid Other

## 2018-09-19 ENCOUNTER — Ambulatory Visit: Payer: Medicaid Other | Admitting: Orthopedic Surgery

## 2019-01-24 ENCOUNTER — Ambulatory Visit (INDEPENDENT_AMBULATORY_CARE_PROVIDER_SITE_OTHER): Payer: Medicaid Other | Admitting: Orthopedic Surgery

## 2019-01-24 ENCOUNTER — Other Ambulatory Visit: Payer: Self-pay

## 2019-01-24 ENCOUNTER — Telehealth: Payer: Self-pay

## 2019-01-24 ENCOUNTER — Ambulatory Visit (INDEPENDENT_AMBULATORY_CARE_PROVIDER_SITE_OTHER): Payer: Medicaid Other

## 2019-01-24 DIAGNOSIS — M542 Cervicalgia: Secondary | ICD-10-CM

## 2019-01-24 DIAGNOSIS — K921 Melena: Secondary | ICD-10-CM

## 2019-01-24 DIAGNOSIS — M25511 Pain in right shoulder: Secondary | ICD-10-CM

## 2019-01-24 DIAGNOSIS — M792 Neuralgia and neuritis, unspecified: Secondary | ICD-10-CM | POA: Diagnosis not present

## 2019-01-24 MED ORDER — METHOCARBAMOL 500 MG PO TABS: 500.0000 mg | ORAL_TABLET | Freq: Three times a day (TID) | ORAL | 0 refills | Status: DC

## 2019-01-24 MED ORDER — HYDROCODONE-ACETAMINOPHEN 5-325 MG PO TABS: 1.0000 | ORAL_TABLET | Freq: Two times a day (BID) | ORAL | 0 refills | Status: DC | PRN

## 2019-01-24 NOTE — Telephone Encounter (Signed)
Can you please help me with getting patient referred to a Primary Care Physician to establish care? His previous Dr Luan Pulling he has not seen in several years and he reports to [place] that they will not see him due to Dr Luan Pulling retiring.  Patient needs referral to establish care and eval for current issue with blood in stool.

## 2019-01-25 ENCOUNTER — Encounter: Payer: Self-pay | Admitting: Orthopedic Surgery

## 2019-01-25 NOTE — Progress Notes (Signed)
Office Visit Note   Patient: Shane Allison           Date of Birth: 1965/06/27           MRN: [id number] Visit Date: 01/24/2019 Requested by: Sinda Du, MD [address] [place],  [place] [address] PCP: Sinda Du, MD  Subjective: Chief Complaint  Patient presents with  . Right Shoulder - Pain  . Neck - Pain    HPI: Shane Allison is a 53 y.o. male who presents to the office complaining of right shoulder/neck pain.  Patient notes right shoulder pain that has been affecting him over the past 2 months.  He was previously seen several months ago for left shoulder pain with radiculopathy following a fall off of a roof top.  He states that this has since improved.  However his right shoulder has now become his primary complaint.  He localizes pain to the scapula and superior right shoulder near the [facility] joint and trapezius muscle.  He has a history of right shoulder AC joint reconstruction for an [facility] joint separation with distal clavicle excision of 8 mm of the distal clavicle.  He notes numbness and tingling in the anterior shoulder but no definite radicular symptoms.  He has been taking 10 pills of Tylenol a day to deal with his pain without relief.  He has also tried a home exercise program without any improvement.Marland Kitchen  He denies any fevers chills.  He denies any other surgeries on his right shoulder.  Patient also notes blood in his stool for 1 week.  He denies any lightheadedness or dizziness.  He also notes a 10 pound weight loss in the last couple months.  His last colonoscopy was about 1 year ago.  He has not seen any provider for this complaint.                ROS:  All systems reviewed are negative as they relate to the chief complaint within the history of present illness.  Patient denies fevers or chills.  Assessment & Plan: Visit Diagnoses:  1. Radicular pain in right arm   2. Arthralgia of right acromioclavicular joint   3. Cervicalgia   4. Blood in stool      Plan: Patient is a 53 year old male who presents complaining of right shoulder pain.  Pain is been ongoing for 2 months.  He has tried a home exercise program without improvement.  Patient has history of significant degeneration in his neck according to previous CT scan of C-spine.  On exam patient has reduced cervical range of motion and pain with palpation over the cervical spine and right trapezius muscle belly as well as the right AC joint.  However on x-rays taken today is [facility] joint shows no evidence of significant regrowth of the distal clavicle.  Impression is cervical radiculopathy.  Ordered MRI of the cervical spine to evaluate for right arm radiculopathy.  Also prescribed Norco and Robaxin.  Patient was also referred to a primary care physician for his blood in stool.  His pulse and vitals are stable.  No real acute blood loss issues but we did strongly encourage him to see his primary care provider to have this worked up further regarding blood in the stool.  Follow-Up Instructions: No follow-ups on file.   Orders:  Orders Placed This Encounter  Procedures  . XR Shoulder Right  . MR Cervical Spine w/o contrast   Meds ordered this encounter  Medications  . HYDROcodone-acetaminophen (  NORCO/VICODIN) 5-325 MG tablet    Sig: Take 1 tablet by mouth every 12 (twelve) hours as needed for moderate pain.    Dispense:  30 tablet    Refill:  0  . methocarbamol (ROBAXIN) 500 MG tablet    Sig: Take 1 tablet (500 mg total) by mouth 3 (three) times daily.    Dispense:  30 tablet    Refill:  0      Procedures: No procedures performed   Clinical Data: No additional findings.  Objective: Vital Signs: There were no vitals taken for this visit.  Physical Exam:  Constitutional: Patient appears well-developed HEENT:  Head: Normocephalic Eyes:EOM are normal Neck: Normal range of motion Cardiovascular: Normal rate Pulmonary/chest: Effort normal Neurologic: Patient is alert Skin: Skin is  warm Psychiatric: Patient has normal mood and affect  Ortho Exam:  Right shoulder Exam Able to fully forward flex and abduct shoulder overhead No loss of ER relative to the other shoulder.  Good endpoint with ER Moderate TTP over the right AC joint.  Severe TTP over the right trapezius muscle leading to the right aspect of the cervical spine. 5/5 motor strength of the subscapularis, supraspinatus, and infraspinatus muscles 5/5 grip strength, forearm pronation/supination, and bicep strength  Specialty Comments:  No specialty comments available.  Imaging: No results found.   PMFS History: Patient Active Problem List   Diagnosis Date Noted  . GERD (gastroesophageal reflux disease) 07/13/2016  . Tubular adenoma of rectum   . Diarrhea 03/03/2016  . Abnormal result of iron profile testing 03/03/2016  . Anemia 12/04/2015  . GI bleed 05/10/2015  . Syncope and collapse 05/10/2015  . RUQ abdominal pain 05/10/2015  . Alcohol abuse 05/10/2015  . COPD (chronic obstructive pulmonary disease) ([place]) 05/10/2015  . Acute pancreatitis 05/10/2015  . AKI (acute kidney injury) ([place]) 05/10/2015  . Hyponatremia 05/10/2015  . Forehead abrasion   . Radicular pain in right arm 05/14/2013  . Cervical spondylosis without myelopathy 05/14/2013  . Numbness and tingling in hands 05/14/2013  . Contusion shoulder/arm 04/02/2013  . Partial tear of rotator cuff 04/02/2013   Past Medical History:  Diagnosis Date  . Alcohol use   . Asthma   . Cervical radiculopathy   . Chronic neck pain   . COPD (chronic obstructive pulmonary disease) ([place])   . GERD (gastroesophageal reflux disease)   . Hypertension   . Joint ache    Right AC joint separation  . Pneumonia   . Psoriasis   . Shortness of breath dyspnea     Family History  Problem Relation Age of Onset  . COPD Mother   . Colon cancer Neg Hx     Past Surgical History:  Procedure Laterality Date  . ACROMIO-CLAVICULAR JOINT REPAIR Right 10/26/2017    Procedure: RIGHT ACROMIO-CLAVICULAR JOINT RECONSTRUCTION;  Surgeon: Meredith Pel, MD;  Location: [place];  Service: Orthopedics;  Laterality: Right;  . BIOPSY  05/12/2015   Procedure: BIOPSY;  Surgeon: Danie Binder, MD;  [facility];  Service: Endoscopy;;  gastric biopsy  . BIOPSY  03/15/2016   Procedure: BIOPSY;  Surgeon: Danie Binder, MD;  [facility];  Service: Endoscopy;;  random colon bx's  . COLONOSCOPY WITH PROPOFOL N/A 03/15/2016   Procedure: COLONOSCOPY WITH PROPOFOL;  Surgeon: Danie Binder, MD;  [facility];  Service: Endoscopy;  Laterality: N/A;  12:30 PM  . ESOPHAGOGASTRODUODENOSCOPY (EGD) WITH PROPOFOL N/A 05/12/2015   Dr. Oneida Alar: normal esophagus, gastritis, bleeding friable  duodenal mucosa  . HAND TENDON SURGERY Right   . MULTIPLE TOOTH EXTRACTIONS    . POLYPECTOMY  03/15/2016   Procedure: POLYPECTOMY;  Surgeon: Danie Binder, MD;  [facility];  Service: Endoscopy;;  rectal polyp  . THUMB AMPUTATION     Partial left thumb    Social History   Occupational History  . Not on file  Tobacco Use  . Smoking status: Current Every Day Smoker    Packs/day: 1.50    Years: 40.00    Pack years: 60.00    Types: Cigarettes  . Smokeless tobacco: Never Used  Substance and Sexual Activity  . Alcohol use: Yes    Alcohol/week: 0.0 standard drinks    Comment: PATIENT REPORT 6 PACK OF BEER SEVERAL DAYS PER WEEK BUT NO EVERY DAY. (07/13/16)  . Drug use: No  . Sexual activity: Not Currently    Birth control/protection: None

## 2019-01-26 ENCOUNTER — Encounter: Payer: Self-pay | Admitting: Orthopedic Surgery

## 2019-01-28 NOTE — Progress Notes (Signed)
Vito Backers is working on referral for this issue.

## 2019-01-29 NOTE — Telephone Encounter (Signed)
Patient callback to say he called to Dr Luan Pulling office to make an appointment and they will be calling him back to schedule.

## 2019-01-30 ENCOUNTER — Telehealth: Payer: Self-pay | Admitting: Orthopedic Surgery

## 2019-01-30 NOTE — Telephone Encounter (Signed)
Patient called in and stated he has a appointment with his primary doctor on tomorrow's date 01/31/2019 at 10 am. Patient unsure if appointment is for MRI. Patient just wanted to notify Dr. Randel Pigg office of appointment with primary. Patient stated to call back if appointment is for MRI. Patient phone number is [phone].

## 2019-01-30 NOTE — Telephone Encounter (Signed)
FYI

## 2019-01-31 LAB — CBC
BASO(ABSOLUTE): 89
Basophils: 1.9
Eosinophils Absolute: 71
Eosinophils, %: 1.5
HCT: 41 (ref 29–41)
Hemoglobin: 13.8
Lymphocytes absolute: 1922 10*3/uL — AB (ref 0.1–1.8)
Lymphocytes: 40.9
MCH: 32.2
MCHC: 33.7
MCV: 95.6 (ref 76–111)
MPV: 9.6 fL (ref 7.5–11.5)
Monocytes(Absolute): 489
Monocytes: 10.4
Neutro Abs: 2129
Neutrophils: 45.3
RBC: 4.29 (ref 3.87–5.11)
RDW: 11.8
WBC: 4.7
platelet count: 287

## 2019-01-31 NOTE — Telephone Encounter (Signed)
Patient wanted to let you know that he is going to see PCP for issue with blood in his stool but that he is still waiting to be scheduled for cervical MRI scan.

## 2019-01-31 NOTE — Telephone Encounter (Signed)
Ok for csp mri

## 2019-04-02 ENCOUNTER — Ambulatory Visit (HOSPITAL_COMMUNITY)
Admission: RE | Admit: 2019-04-02 | Discharge: 2019-04-02 | Disposition: A | Discharge status: Home / Self Care | Payer: Medicaid Other | Source: Ambulatory Visit | Attending: Orthopedic Surgery | Admitting: Orthopedic Surgery

## 2019-04-02 ENCOUNTER — Other Ambulatory Visit: Payer: Self-pay

## 2019-04-02 DIAGNOSIS — M542 Cervicalgia: Secondary | ICD-10-CM | POA: Diagnosis not present

## 2019-04-04 ENCOUNTER — Ambulatory Visit (INDEPENDENT_AMBULATORY_CARE_PROVIDER_SITE_OTHER): Payer: Medicaid Other | Admitting: Orthopedic Surgery

## 2019-04-04 ENCOUNTER — Other Ambulatory Visit: Payer: Self-pay

## 2019-04-04 DIAGNOSIS — M792 Neuralgia and neuritis, unspecified: Secondary | ICD-10-CM | POA: Diagnosis not present

## 2019-04-04 DIAGNOSIS — R918 Other nonspecific abnormal finding of lung field: Secondary | ICD-10-CM | POA: Diagnosis not present

## 2019-04-04 DIAGNOSIS — M542 Cervicalgia: Secondary | ICD-10-CM

## 2019-04-05 ENCOUNTER — Encounter: Payer: Self-pay | Admitting: Orthopedic Surgery

## 2019-04-05 NOTE — Progress Notes (Signed)
Office Visit Note   Patient: Shane Allison           Date of Birth: 10-04-1965           MRN: [id number] Visit Date: 04/04/2019 Requested by: Sinda Du, MD No address on file PCP: Sinda Du, MD  Subjective: Chief Complaint  Patient presents with  . Follow-up    HPI: Shane Allison is a 54 y.o. male who presents to the office complaining of right arm radicular pain.  He returns to discuss MRI results.  Patient notes that his radicular symptoms are keeping him from sleeping.  He reports neck pain that is worsening as well as subjective weakness.  He is taking Tylenol for pain control.  He denies any history of spine surgery.  He has had epidural steroid injections in his lumbar spine before but never in his cervical spine..                ROS:  All systems reviewed are negative as they relate to the chief complaint within the history of present illness.  Patient denies fevers or chills.  Assessment & Plan: Visit Diagnoses:  1. Radicular pain in right arm   2. Abnormal findings on diagnostic imaging of lung   3. Cervicalgia     Plan: Patient is a 54 year old male who presents complaining of right radicular arm pain.  He returns for follow-up visit to discuss MRI cervical spine results.  MRI of the cervical spine revealed moderately severe right foraminal stenosis at C2-C3, moderate bilateral foraminal stenosis at C4-C5, disc protrusion at C5-C6 with slight compression of the left side of the spinal cord without myelopathy.  Overall there has not been significant progression of any of the findings compared with prior MRI from 2015.  This is encouraging.  Will refer patient to Dr. Laurence Spates for epidural steroid injections of the cervical spine.  Additionally ordered CT scan of the chest to evaluate pulmonary nodule that was previously identified on prior x-ray.  Patient agreed this plan will follow up with the office after CT scan to review results.  This definitely  looks like radicular pain and not shoulder mediated pain.  Follow-Up Instructions: No follow-ups on file.   Orders:  Orders Placed This Encounter  Procedures  . CT CHEST NODULE FOLLOW UP LOW DOSE W/O  . Ambulatory referral to Physical Medicine Rehab   No orders of the defined types were placed in this encounter.     Procedures: No procedures performed   Clinical Data: No additional findings.  Objective: Vital Signs: There were no vitals taken for this visit.  Physical Exam:  Constitutional: Patient appears well-developed HEENT:  Head: Normocephalic Eyes:EOM are normal Neck: Normal range of motion Cardiovascular: Normal rate Pulmonary/chest: Effort normal Neurologic: Patient is alert Skin: Skin is warm Psychiatric: Patient has normal mood and affect  Ortho Exam:  5/5 motor strength of the bilateral grip strength, finger abduction, pronation, supination, bicep flexion, tricep extension, deltoid.  Sensation intact through all dermatomes of the bilateral upper extremities.  Mild tenderness to palpation throughout the axial cervical spine.  Specialty Comments:  No specialty comments available.  Imaging: No results found.   PMFS History: Patient Active Problem List   Diagnosis Date Noted  . GERD (gastroesophageal reflux disease) 07/13/2016  . Tubular adenoma of rectum   . Diarrhea 03/03/2016  . Abnormal result of iron profile testing 03/03/2016  . Anemia 12/04/2015  . GI bleed 05/10/2015  . Syncope and collapse  05/10/2015  . RUQ abdominal pain 05/10/2015  . Alcohol abuse 05/10/2015  . COPD (chronic obstructive pulmonary disease) ([place]) 05/10/2015  . Acute pancreatitis 05/10/2015  . AKI (acute kidney injury) ([place]) 05/10/2015  . Hyponatremia 05/10/2015  . Forehead abrasion   . Radicular pain in right arm 05/14/2013  . Cervical spondylosis without myelopathy 05/14/2013  . Numbness and tingling in hands 05/14/2013  . Contusion shoulder/arm 04/02/2013  . Partial  tear of rotator cuff 04/02/2013   Past Medical History:  Diagnosis Date  . Alcohol use   . Asthma   . Cervical radiculopathy   . Chronic neck pain   . COPD (chronic obstructive pulmonary disease) ([place])   . GERD (gastroesophageal reflux disease)   . Hypertension   . Joint ache    Right AC joint separation  . Pneumonia   . Psoriasis   . Shortness of breath dyspnea     Family History  Problem Relation Age of Onset  . COPD Mother   . Colon cancer Neg Hx     Past Surgical History:  Procedure Laterality Date  . ACROMIO-CLAVICULAR JOINT REPAIR Right 10/26/2017   Procedure: RIGHT ACROMIO-CLAVICULAR JOINT RECONSTRUCTION;  Surgeon: Meredith Pel, MD;  Location: [place];  Service: Orthopedics;  Laterality: Right;  . BIOPSY  05/12/2015   Procedure: BIOPSY;  Surgeon: Danie Binder, MD;  [facility];  Service: Endoscopy;;  gastric biopsy  . BIOPSY  03/15/2016   Procedure: BIOPSY;  Surgeon: Danie Binder, MD;  [facility];  Service: Endoscopy;;  random colon bx's  . COLONOSCOPY WITH PROPOFOL N/A 03/15/2016   Procedure: COLONOSCOPY WITH PROPOFOL;  Surgeon: Danie Binder, MD;  [facility];  Service: Endoscopy;  Laterality: N/A;  12:30 PM  . ESOPHAGOGASTRODUODENOSCOPY (EGD) WITH PROPOFOL N/A 05/12/2015   Dr. Oneida Alar: normal esophagus, gastritis, bleeding friable duodenal mucosa  . HAND TENDON SURGERY Right   . MULTIPLE TOOTH EXTRACTIONS    . POLYPECTOMY  03/15/2016   Procedure: POLYPECTOMY;  Surgeon: Danie Binder, MD;  [facility];  Service: Endoscopy;;  rectal polyp  . THUMB AMPUTATION     Partial left thumb    Social History   Occupational History  . Not on file  Tobacco Use  . Smoking status: Current Every Day Smoker    Packs/day: 1.50    Years: 40.00    Pack years: 60.00    Types: Cigarettes  . Smokeless tobacco: Never Used  Substance and Sexual Activity  . Alcohol use: Yes    Alcohol/week: 0.0 standard drinks    Comment:  PATIENT REPORT 6 PACK OF BEER SEVERAL DAYS PER WEEK BUT NO EVERY DAY. (07/13/16)  . Drug use: No  . Sexual activity: Not Currently    Birth control/protection: None

## 2019-04-17 ENCOUNTER — Ambulatory Visit (INDEPENDENT_AMBULATORY_CARE_PROVIDER_SITE_OTHER): Payer: Medicaid Other | Admitting: Physical Medicine and Rehabilitation

## 2019-04-17 ENCOUNTER — Ambulatory Visit: Payer: Self-pay

## 2019-04-17 ENCOUNTER — Encounter: Payer: Self-pay | Admitting: Physical Medicine and Rehabilitation

## 2019-04-17 ENCOUNTER — Other Ambulatory Visit: Payer: Self-pay

## 2019-04-17 VITALS — BP 139/94 | HR 100

## 2019-04-17 DIAGNOSIS — M5010 Cervical disc disorder with radiculopathy, unspecified cervical region: Secondary | ICD-10-CM

## 2019-04-17 DIAGNOSIS — M5412 Radiculopathy, cervical region: Secondary | ICD-10-CM

## 2019-04-17 MED ORDER — METHYLPREDNISOLONE ACETATE 80 MG/ML IJ SUSP
40.0000 mg | Freq: Once | INTRAMUSCULAR | Status: AC
  Administered 2019-04-17: 16:00:00 40 mg

## 2019-04-17 NOTE — Progress Notes (Signed)
 .  Numeric Pain Rating Scale and Functional Assessment Average Pain 8   In the last MONTH (on 0-10 scale) has pain interfered with the following?  1. General activity like being  able to carry out your everyday physical activities such as walking, climbing stairs, carrying groceries, or moving a chair?  Rating(8)   +Driver, -BT, -Dye Allergies.  

## 2019-04-18 NOTE — Progress Notes (Signed)
[name] - 54 y.o. male MRN [id number]  Date of birth: Apr 30, 1965  Office Visit Note: Visit Date: 04/17/2019 PCP: Sinda Du, MD Referred by: Sinda Du, MD  Subjective: Chief Complaint  Patient presents with  . Neck - Pain  . Right Shoulder - Pain  . Left Shoulder - Pain   HPI:  Shane Allison is a 54 y.o. male who comes in today For planned right C7-T1 interlaminar epidural steroid injection at the request of Dr. Anderson Malta.  Patient's had chronic ongoing right neck and shoulder pain with some referral in the arm.  He says he started getting on the left side now.  MRI reviewed with the patient and reviewed below.  Patient's had prior lumbar epidural injections through Dr. Marlaine Hind for lumbar spine issues and he says those really have never worked for him.  The patient has failed conservative care including home exercise, medications, time and activity modification.  This injection will be diagnostic and hopefully therapeutic.  Please see requesting physician notes for further details and justification.   ROS Otherwise per HPI.  Assessment & Plan: Visit Diagnoses:  1. Cervical radiculopathy   2. Cervical disc disorder with radiculopathy     Plan: No additional findings.   Meds & Orders:  Meds ordered this encounter  Medications  . methylPREDNISolone acetate (DEPO-MEDROL) injection 40 mg    Orders Placed This Encounter  Procedures  . XR C-ARM NO REPORT  . Epidural Steroid injection    Follow-up: Return if symptoms worsen or fail to improve.   Procedures: No procedures performed  Cervical Epidural Steroid Injection - Interlaminar Approach with Fluoroscopic Guidance  Patient: KENAAN Allison      Date of Birth: 03/25/1965 MRN: [id number] PCP: Sinda Du, MD      Visit Date: 04/17/2019   Universal Protocol:    Date/Time: 02/25/216:08 AM  Consent Given By: the patient  Position: PRONE  Additional Comments: Vital signs were  monitored before and after the procedure. Patient was prepped and draped in the usual sterile fashion. The correct patient, procedure, and site was verified.   Injection Procedure Details:  Procedure Site One Meds Administered:  Meds ordered this encounter  Medications  . methylPREDNISolone acetate (DEPO-MEDROL) injection 40 mg     Laterality: Right  Location/Site: C7-T1  Needle size: 20 G  Needle type: Touhy  Needle Placement: Paramedian epidural space  Findings:  -Comments: Excellent flow of contrast into the epidural space.  Procedure Details: Using a paramedian approach from the side mentioned above, the region overlying the inferior lamina was localized under fluoroscopic visualization and the soft tissues overlying this structure were infiltrated with 4 ml. of 1% Lidocaine without Epinephrine. A # 20 gauge, Tuohy needle was inserted into the epidural space using a paramedian approach.  The epidural space was localized using loss of resistance along with lateral and contralateral oblique bi-planar fluoroscopic views.  After negative aspirate for air, blood, and CSF, a 2 ml. volume of Isovue-250 was injected into the epidural space and the flow of contrast was observed. Radiographs were obtained for documentation purposes.   The injectate was administered into the level noted above.  Additional Comments:  The patient tolerated the procedure well Dressing: 2 x 2 sterile gauze and Band-Aid    Post-procedure details: Patient was observed during the procedure. Post-procedure instructions were reviewed.  Patient left the clinic in stable condition.     Clinical History: MRI CERVICAL SPINE WITHOUT CONTRAST  TECHNIQUE: Multiplanar,  multisequence MR imaging of the cervical spine was performed. No intravenous contrast was administered.  COMPARISON:  CT scan of the cervical spine dated 08/10/2018 and cervical MRI dated 05/29/2013  FINDINGS: Alignment:  Straightening of the mid to lower cervical lordosis.  Vertebrae: No fracture, evidence of discitis, or bone lesion.  Cord: Normal signal and morphology.  Posterior Fossa, vertebral arteries, paraspinal tissues: Negative.  C1-2: Slight hypertrophy of the transverse ligament with slight narrowing of the AP dimension of the spinal canal without spinal cord compression.  Disc levels:  C2-3: Prominent uncinate spurs into the right lateral recess and right neural foramen with moderately severe right foraminal stenosis. Small uncinate spurs to the left with slight narrowing of the left lateral recess but there is no left foraminal stenosis.  C3-4: Small uncinate spurs to the right and left but more to the left with slight narrowing of the left neural foramen. Slight bilateral facet arthritis.  C4-5: Disc space narrowing. Broad-based disc osteophyte complex symmetrically narrowing the AP dimension of the spinal canal without spinal cord compression. Moderate bilateral foraminal stenosis. Slight left facet arthritis.  C5-6: Disc space narrowing. Disc protrusion to the left of midline extending into the left lateral recess with accompanying osteophytes. This compresses the ventral aspect of the left side of the spinal cord slightly without myelopathy. No impingement on the right. No facet arthritis.  C6-7: Disc space narrowing. Minimal uncinate spurs to the right and left without focal neural impingement.  C7-T1: Normal.  IMPRESSION: 1. Moderately severe right foraminal stenosis at C2-3 which could affect the right C3 nerve. 2. Moderate bilateral foraminal stenosis at C4-5. 3. Disc protrusion at C5-6 to the left of midline with slight compression of the left side of the spinal cord without myelopathy. 4. Compared to the prior study of 05/29/2013, there has been no significant change in any of the findings.   Electronically Signed   By: Lorriane Shire M.D.   On:  04/03/2019 09:20     Objective:  VS:  HT:    WT:   BMI:     BP:(!) 139/94  HR:100bpm  TEMP: ( )  RESP:  Physical Exam  Ortho Exam Imaging: XR C-ARM NO REPORT  Result Date: 04/17/2019 Please see Notes tab for imaging impression.

## 2019-04-18 NOTE — Procedures (Signed)
Cervical Epidural Steroid Injection - Interlaminar Approach with Fluoroscopic Guidance  Patient: Shane Allison      Date of Birth: September 21, 1965 MRN: [id number] PCP: Sinda Du, MD      Visit Date: 04/17/2019   Universal Protocol:    Date/Time: 02/25/216:08 AM  Consent Given By: the patient  Position: PRONE  Additional Comments: Vital signs were monitored before and after the procedure. Patient was prepped and draped in the usual sterile fashion. The correct patient, procedure, and site was verified.   Injection Procedure Details:  Procedure Site One Meds Administered:  Meds ordered this encounter  Medications  . methylPREDNISolone acetate (DEPO-MEDROL) injection 40 mg     Laterality: Right  Location/Site: C7-T1  Needle size: 20 G  Needle type: Touhy  Needle Placement: Paramedian epidural space  Findings:  -Comments: Excellent flow of contrast into the epidural space.  Procedure Details: Using a paramedian approach from the side mentioned above, the region overlying the inferior lamina was localized under fluoroscopic visualization and the soft tissues overlying this structure were infiltrated with 4 ml. of 1% Lidocaine without Epinephrine. A # 20 gauge, Tuohy needle was inserted into the epidural space using a paramedian approach.  The epidural space was localized using loss of resistance along with lateral and contralateral oblique bi-planar fluoroscopic views.  After negative aspirate for air, blood, and CSF, a 2 ml. volume of Isovue-250 was injected into the epidural space and the flow of contrast was observed. Radiographs were obtained for documentation purposes.   The injectate was administered into the level noted above.  Additional Comments:  The patient tolerated the procedure well Dressing: 2 x 2 sterile gauze and Band-Aid    Post-procedure details: Patient was observed during the procedure. Post-procedure instructions were reviewed.  Patient  left the clinic in stable condition.

## 2019-05-16 ENCOUNTER — Encounter: Payer: Self-pay | Admitting: Radiology

## 2019-07-16 ENCOUNTER — Ambulatory Visit (INDEPENDENT_AMBULATORY_CARE_PROVIDER_SITE_OTHER): Payer: Medicaid Other | Admitting: Pulmonary Disease

## 2019-07-16 ENCOUNTER — Encounter: Payer: Self-pay | Admitting: Pulmonary Disease

## 2019-07-16 ENCOUNTER — Other Ambulatory Visit: Payer: Self-pay

## 2019-07-16 DIAGNOSIS — Z720 Tobacco use: Secondary | ICD-10-CM | POA: Insufficient documentation

## 2019-07-16 DIAGNOSIS — J432 Centrilobular emphysema: Secondary | ICD-10-CM | POA: Diagnosis not present

## 2019-07-16 MED ORDER — PREDNISONE 10 MG PO TABS
ORAL_TABLET | ORAL | 0 refills | Status: DC
Start: 2019-07-16 — End: 2019-09-03

## 2019-07-16 MED ORDER — NICOTINE 21 MG/24HR TD PT24: 21.0000 mg | MEDICATED_PATCH | Freq: Every day | TRANSDERMAL | 0 refills | Status: DC

## 2019-07-16 MED ORDER — ANORO ELLIPTA 62.5-25 MCG/INH IN AEPB
1.0000 | INHALATION_SPRAY | Freq: Every day | RESPIRATORY_TRACT | 0 refills | Status: DC
Start: 2019-07-16 — End: 2019-09-03

## 2019-07-16 NOTE — Assessment & Plan Note (Addendum)
Some bronchospasm noted today hence will provide a short course of prednisone  No clear infective exacerbation, this may be related to allergies  prednisone 10 mg tabs  Take 2 tabs daily with food x 5ds, then 1 tab daily with food x 5ds then STOP Trial of Anoro sample -1 puff daily, this would be a maintenance medication, if this works, call me for prescription Continue to use albuterol 2 puffs every 6 hours as needed for wheezing   Covid vaccine advised-his questions about vaccine hesitancy were addressed

## 2019-07-16 NOTE — Progress Notes (Signed)
Subjective:    Patient ID: Shane Allison, male    DOB: 1965/05/13, 54 y.o.   MRN: [id number]  HPI  Chief Complaint  Patient presents with  . Consult    Patient has some shortness of breath with exertion when walking or when he gets to hot. Has dry cough. Has COPD and asthma.   54 year old heavy smoker presents to establish care for COPD He has previously seen Dr. Luan Pulling, PFTs have shown moderate airway obstruction.  He was given Spiriva but this made his chest feel worse so he really has only been taking albuterol on an as-needed basis. He complains of increased dyspnea on exertion, cough productive of clears phlegm worse during allergy season.  He also reports some degree of orthopnea and cough and does wake him up at night.  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  Has now filed for disability-reports injury to his right shoulder pinched nerve in his neck and herniated disks in his back He smokes about 2 packs a day, drinks a sixpack at night, denies history of alcohol withdrawal   Significant tests/ events reviewed  PFTs 10/2016 ratio 65, FEV1 75%, FVC 91%, no bronchodilator response, TLC 1 1 4%, DLCO 67%  CT chest without contrast 08/2017, stable 5 mm left apical nodule compared to 06/2016 and abdominal CT from 04/2015, tree-in-bud and lingula?  Inflammatory   Past Medical History:  Diagnosis Date  . Alcohol use   . Asthma   . Cervical radiculopathy   . Chronic neck pain   . COPD (chronic obstructive pulmonary disease) ([place])   . GERD (gastroesophageal reflux disease)   .  Hypertension   . Joint ache    Right AC joint separation  . Pneumonia   . Psoriasis   . Shortness of breath dyspnea     Past Surgical History:  Procedure Laterality Date  . ACROMIO-CLAVICULAR JOINT REPAIR Right 10/26/2017   Procedure: RIGHT ACROMIO-CLAVICULAR JOINT RECONSTRUCTION;  Surgeon: Meredith Pel, MD;  Location: [facility];  Service: Orthopedics;  Laterality: Right;  . BIOPSY  05/12/2015   Procedure: BIOPSY;  Surgeon: Danie Binder, MD;  [facility];  Service: Endoscopy;;  gastric biopsy  . BIOPSY  03/15/2016   Procedure: BIOPSY;  Surgeon: Danie Binder, MD;  [facility];  Service: Endoscopy;;  random colon bx's  . COLONOSCOPY WITH PROPOFOL N/A 03/15/2016   Procedure: COLONOSCOPY WITH PROPOFOL;  Surgeon: Danie Binder, MD;  [facility];  Service: Endoscopy;  Laterality: N/A;  12:30 PM  . ESOPHAGOGASTRODUODENOSCOPY (EGD) WITH PROPOFOL N/A 05/12/2015   Dr. Oneida Alar: normal esophagus, gastritis, bleeding friable duodenal mucosa  . HAND TENDON SURGERY Right   . MULTIPLE TOOTH EXTRACTIONS    . POLYPECTOMY  03/15/2016   Procedure: POLYPECTOMY;  Surgeon: Danie Binder, MD;  [facility];  Service: Endoscopy;;  rectal polyp  . THUMB AMPUTATION     Partial left thumb     Allergies  Allergen Reactions  . Penicillins Rash    Has patient had a PCN reaction causing immediate  rash, facial/tongue/throat swelling, SOB or lightheadedness with hypotension: Yes Has patient had a PCN reaction causing severe rash involving mucus membranes or skin necrosis: No Has patient had a PCN reaction that required hospitalization No Has patient had a PCN reaction occurring within the last 10 years: No If all of the above answers are "NO", then may proceed with Cephalosporin use.       Social History   Socioeconomic History  . Marital status: Single    Spouse name: Not on file  . Number of children: Not on file  . Years of education: Not on file  .  Highest education level: Not on file  Occupational History  . Not on file  Tobacco Use  . Smoking status: Current Every Day Smoker    Packs/day: 2.00    Years: 40.00    Pack years: 80.00    Types: Cigarettes  . Smokeless tobacco: Never Used  Substance and Sexual Activity  . Alcohol use: Yes    Comment: PATIENT REPORT 6 PACK OF BEER a day  . Drug use: No  . Sexual activity: Not Currently    Birth control/protection: None  Other Topics Concern  . Not on file  Social History Narrative  . Not on file   Social Determinants of Health   Financial Resource Strain:   . Difficulty of Paying Living Expenses:   Food Insecurity:   . Worried About Charity fundraiser in the Last Year:   . Arboriculturist in the Last Year:   Transportation Needs:   . Film/video editor (Medical):   Marland Kitchen Lack of Transportation (Non-Medical):   Physical Activity:   . Days of Exercise per Week:   . Minutes of Exercise per Session:   Stress:   . Feeling of Stress :   Social Connections:   . Frequency of Communication with Friends and Family:   . Frequency of Social Gatherings with Friends and Family:   . Attends Religious Services:   . Active Member of Clubs or Organizations:   . Attends Archivist Meetings:   Marland Kitchen Marital Status:   Intimate Partner Violence:   . Fear of Current or Ex-Partner:   . Emotionally Abused:   Marland Kitchen Physically Abused:   . Sexually Abused:      Family History  Problem Relation Age of Onset  . COPD Mother   . Colon cancer Neg Hx      Review of Systems Constitutional: negative for anorexia, fevers and sweats  Eyes: negative for irritation, redness and visual disturbance  Ears, nose, mouth, throat, and face: negative for earaches, epistaxis, nasal congestion and sore throat   Cardiovascular: negative for chest pain,  lower extremity edema,  palpitations and syncope  Gastrointestinal: negative for abdominal pain, constipation, diarrhea, melena, nausea and vomiting   Genitourinary:negative for dysuria, frequency and hematuria  Hematologic/lymphatic: negative for bleeding, easy bruising and lymphadenopathy  Musculoskeletal:negative for arthralgias, muscle weakness and stiff joints  Neurological: negative for coordination problems, gait problems, headaches and weakness  Endocrine: negative for diabetic symptoms including polydipsia, polyuria and weight loss     Objective:   Physical Exam  Gen. Pleasant, well-nourished, in no distress, normal affect ENT - no pallor,icterus, no post nasal drip Neck: No JVD, no thyromegaly, no carotid bruits Lungs: no use of accessory muscles, no dullness to percussion,faint exp diffuse rhonchi  Cardiovascular: Rhythm regular, heart sounds  normal, no murmurs or gallops, no peripheral edema Abdomen: soft and non-tender, no hepatosplenomegaly, BS  normal. Musculoskeletal: No deformities, no cyanosis or clubbing Neuro:  alert, non focal       Assessment & Plan:

## 2019-07-16 NOTE — Assessment & Plan Note (Signed)
Emphasized smoking cessation Trial of nicotine patch 21 mg daily Does not want to try Chantix again due to fear of suicidal thoughts  We will discuss lung cancer screening in the future

## 2019-07-16 NOTE — Patient Instructions (Signed)
Prednisone 10 mg tabs  Take 2 tabs daily with food x 5ds, then 1 tab daily with food x 5ds then STOP Trial of Anoro sample -1 puff daily, this would be a maintenance medication, if this works, call me for prescription Continue to use albuterol 2 puffs every 6 hours as needed for wheezing  You have to quit smoking if you want to live longer! Trial of nicotine patch 21 mg daily  Please get your Covid vaccine

## 2019-07-16 NOTE — Addendum Note (Signed)
Addended by: Lia Foyer R on: 07/16/2019 10:39 AM   Modules accepted: Orders

## 2019-09-03 ENCOUNTER — Ambulatory Visit (INDEPENDENT_AMBULATORY_CARE_PROVIDER_SITE_OTHER): Payer: Medicaid Other | Admitting: Pulmonary Disease

## 2019-09-03 ENCOUNTER — Encounter: Payer: Self-pay | Admitting: Pulmonary Disease

## 2019-09-03 ENCOUNTER — Other Ambulatory Visit: Payer: Self-pay | Admitting: Pulmonary Disease

## 2019-09-03 ENCOUNTER — Telehealth: Payer: Self-pay | Admitting: Pulmonary Disease

## 2019-09-03 ENCOUNTER — Other Ambulatory Visit: Payer: Self-pay

## 2019-09-03 DIAGNOSIS — I10 Essential (primary) hypertension: Secondary | ICD-10-CM

## 2019-09-03 DIAGNOSIS — J432 Centrilobular emphysema: Secondary | ICD-10-CM

## 2019-09-03 MED ORDER — ALBUTEROL SULFATE HFA 108 (90 BASE) MCG/ACT IN AERS: 2.0000 | INHALATION_SPRAY | Freq: Four times a day (QID) | RESPIRATORY_TRACT | 2 refills | Status: DC | PRN

## 2019-09-03 MED ORDER — LOSARTAN POTASSIUM 50 MG PO TABS: 50.0000 mg | ORAL_TABLET | Freq: Every day | ORAL | 2 refills | Status: DC

## 2019-09-03 MED ORDER — ANORO ELLIPTA 62.5-25 MCG/INH IN AEPB: 1.0000 | INHALATION_SPRAY | Freq: Every day | RESPIRATORY_TRACT | 2 refills | Status: DC

## 2019-09-03 NOTE — Progress Notes (Signed)
   Subjective:    Patient ID: Shane Allison, male    DOB: 1966-02-18, 54 y.o.   MRN: [id number]  HPI  54 year old smoker for follow-up of COPD  Chief Complaint  Patient presents with  . Follow-up    Breathing is "so so"- worse when out the the humid weather. He used up the anoro given last visit and states this did help some while taking it. He is using his proair approx 3 x per day.    On his last visit 06/2019, we give him a short course of prednisone and started on Anoro, this helped but he never filled the Rx Continues to smoke, nicotine patch would 'not stick'  BP hihg today, no headache, ran out of losartan , has not established with PCP yet, wants refill   Significant tests/ events reviewed  PFTs 10/2016 ratio 65, FEV1 75%, FVC 91%, no bronchodilator response, TLC 1 1 4%, DLCO 67%  CT chest without contrast 08/2017, stable 5 mm left apical nodule compared to 06/2016 and abdominal CT from 04/2015, tree-in-bud and lingula?  Inflammatory  Review of Systems Patient denies significant dyspnea,cough, hemoptysis,  chest pain, palpitations, pedal edema, orthopnea, paroxysmal nocturnal dyspnea, lightheadedness, nausea, vomiting, abdominal or  leg pains      Objective:   Physical Exam  Gen. Pleasant, well-nourished, in no distress ENT - no thrush, no pallor/icterus,no post nasal drip Neck: No JVD, no thyromegaly, no carotid bruits Lungs: no use of accessory muscles, no dullness to percussion, clear without rales or rhonchi  Cardiovascular: Rhythm regular, heart sounds  normal, no murmurs or gallops, no peripheral edema Musculoskeletal: No deformities, no cyanosis or clubbing         Assessment & Plan:

## 2019-09-03 NOTE — Patient Instructions (Signed)
Refill on losartan  Rx for anoro & pro-air  Resume nicotine patch & try to cut down smoking Establish with PCP  Schedule spirometry pre-post

## 2019-09-03 NOTE — Assessment & Plan Note (Signed)
Rx for anoro & pro-air  Resume nicotine patch & try to cut down smoking   Schedule spirometry pre-post

## 2019-09-03 NOTE — Assessment & Plan Note (Signed)
Refill on losartan  Establish with PCP

## 2019-09-03 NOTE — Telephone Encounter (Signed)
Called pt's pharmacy and spoke with Melissa letting her know that it was fine to switch pt's inhaler to proair since it is the one covered by insurance. Melissa verbalized understanding. Nothing further needed.

## 2019-10-01 IMAGING — MR MR PELVIS W/O CM
4 of 5 series · 17 of 48 positions shown · non-contrast
Comparison: MRI 08/01/2017.  CT 05/10/2015.

CLINICAL DATA: Progressive posterior left hip pain for 2 years. No
acute injury.

EXAM:
MRI PELVIS WITHOUT CONTRAST
TECHNIQUE: Multiplanar multisequence MR imaging of the pelvis was performed. No
intravenous contrast was administered.

[Series 3: STIR · coronal · 4.0mm · 0.68mm/px · 7 of 30 slices shown]
[im 1/30]
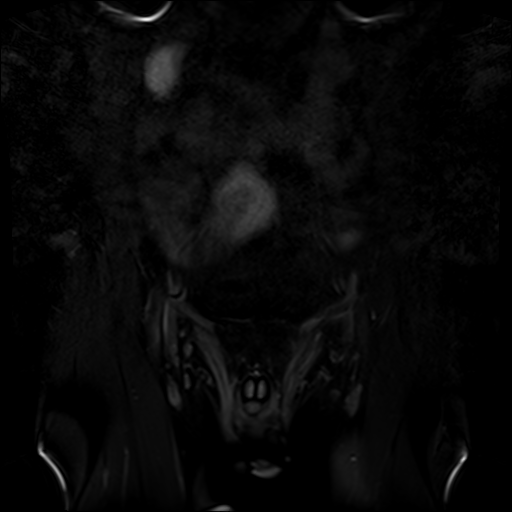
[im 5/30]
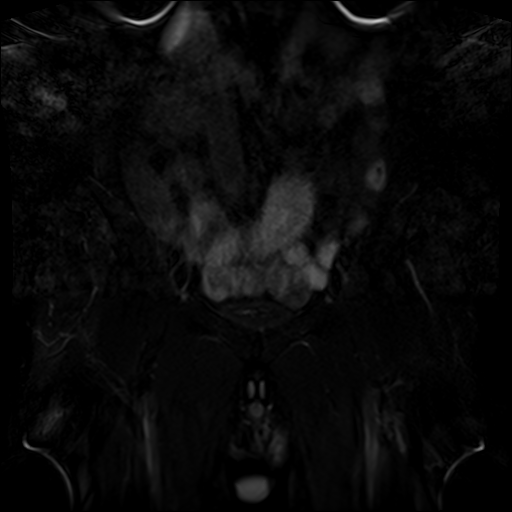
[im 10/30]
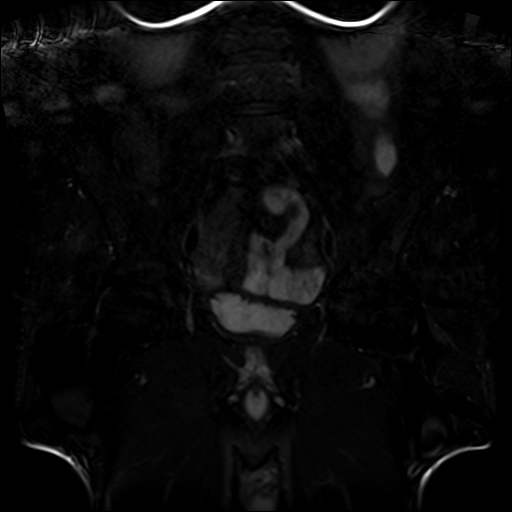
[im 15/30]
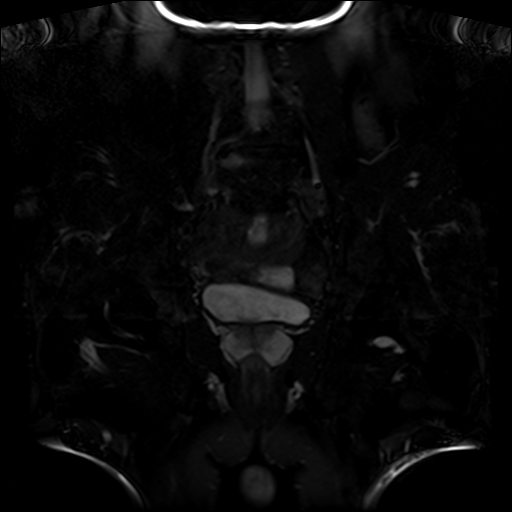
[im 20/30]
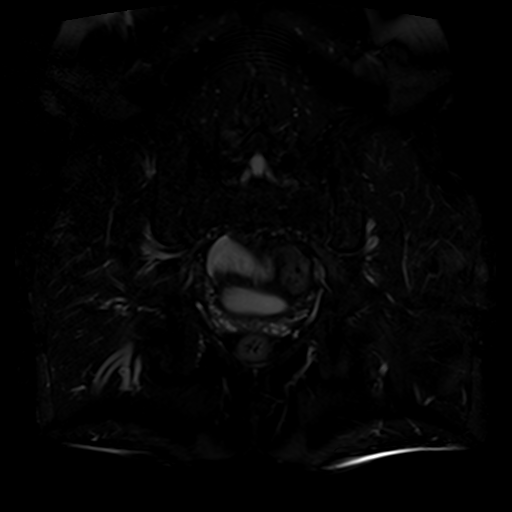
[im 25/30]
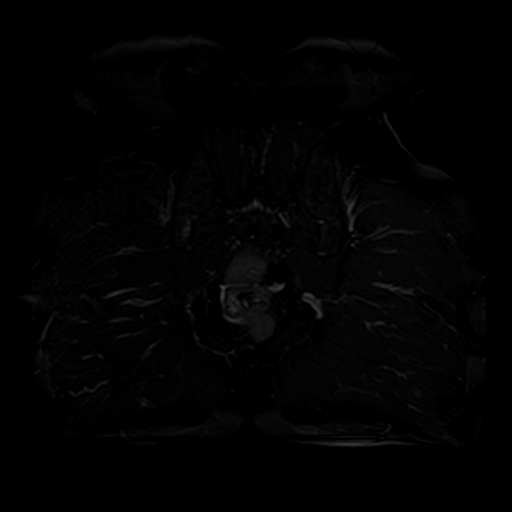
[im 30/30]
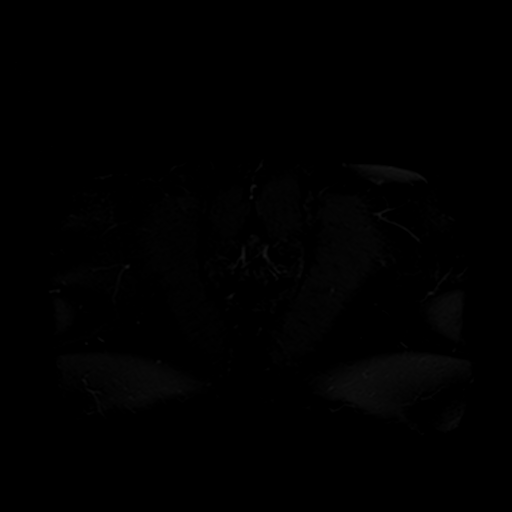

[Series 4: T1 · coronal · 4.0mm · 0.55mm/px · 4 of 30 slices shown (1 of 2)]
[im 1/30]
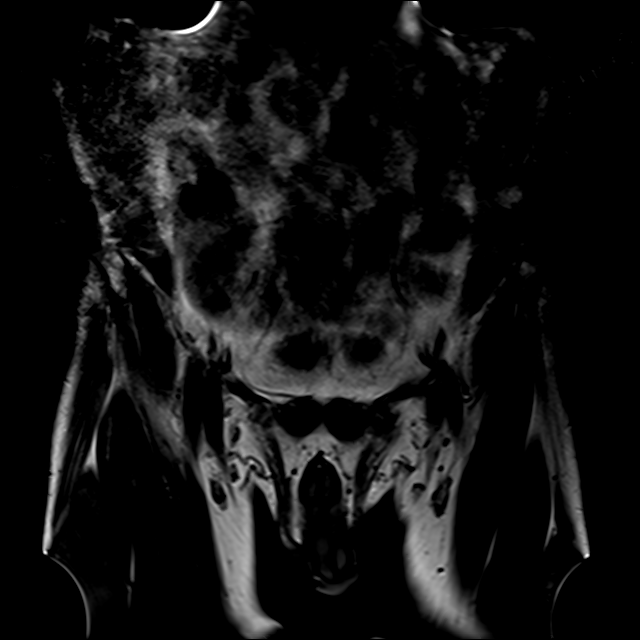
[im 5/30]
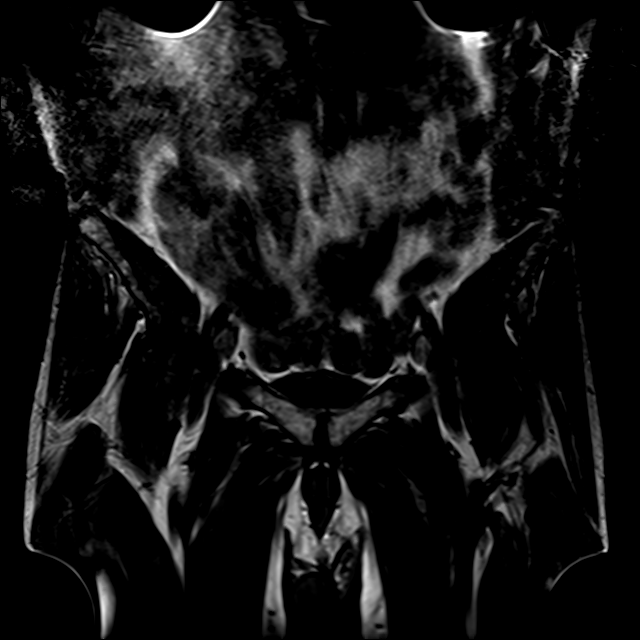
[im 15/30]
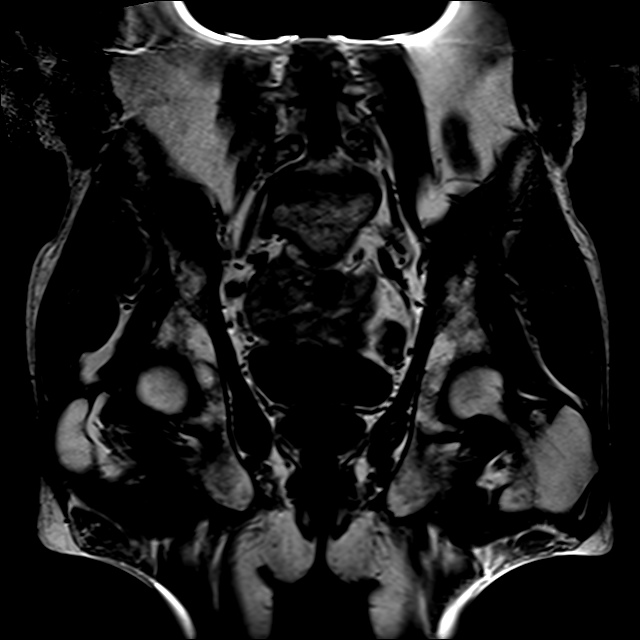
[im 25/30]
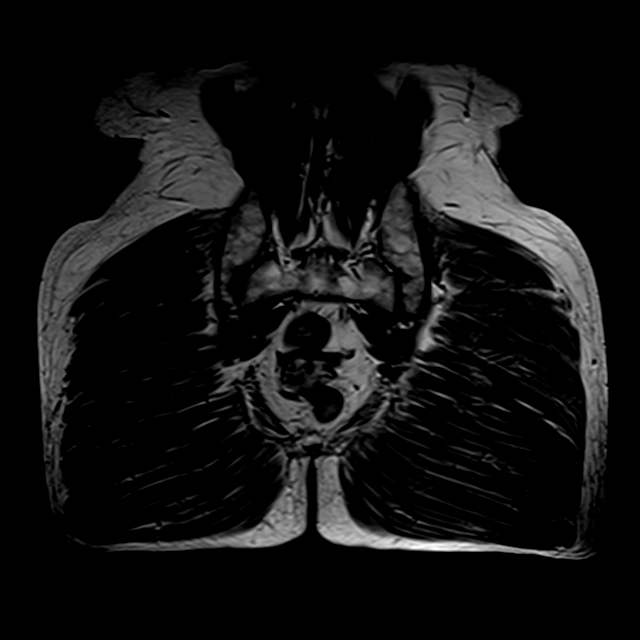

[Series 7: T2 fat-sat · axial · 4.0mm · 0.68mm/px · z∈[-15,+155]mm · 3 of 44 slices shown]
[im 5/44]
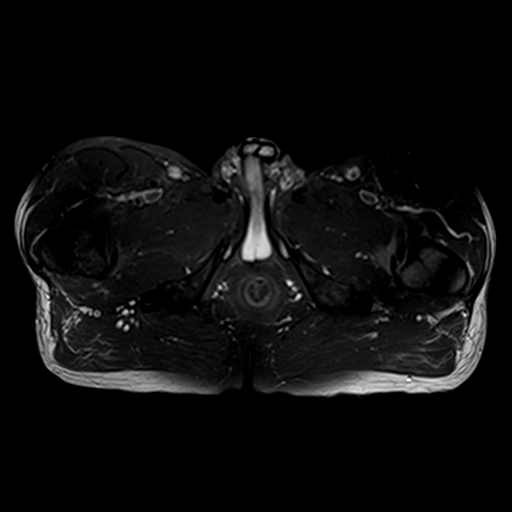
[im 24/44]
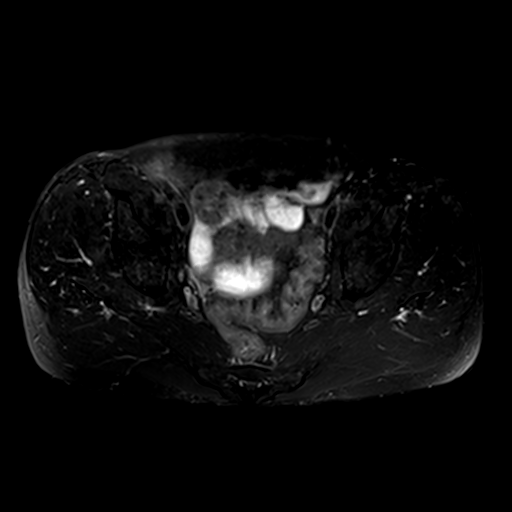
[im 39/44]
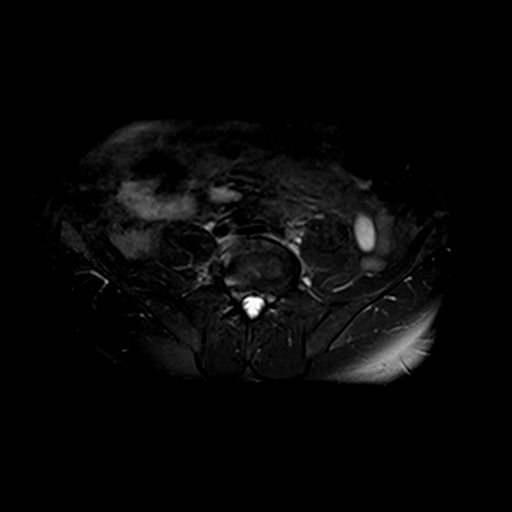

[Series 8: T1 · axial · 4.0mm · 0.68mm/px · z∈[-15,+155]mm · 3 of 44 slices shown (2 of 2)]
[im 5/44]
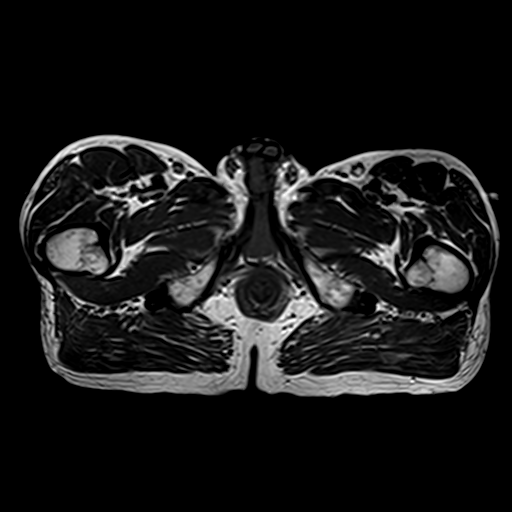
[im 24/44]
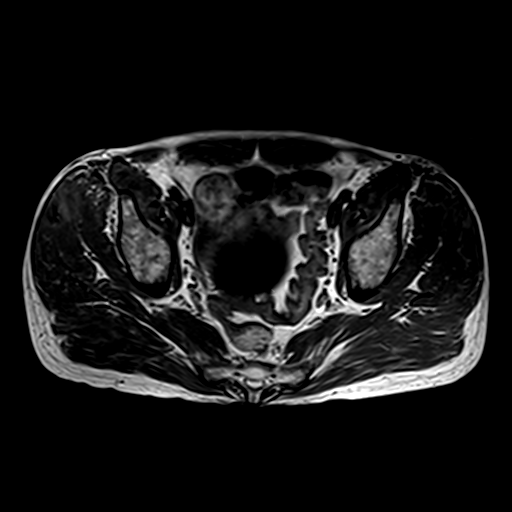
[im 39/44]
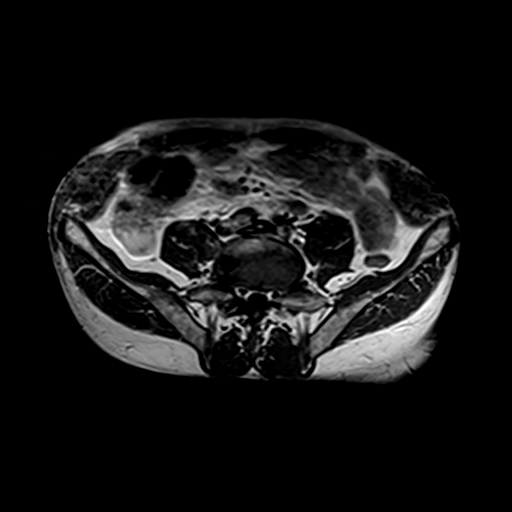

[17 of 48 positions shown; findings below may reference images not displayed]

FINDINGS: Urinary Tract: The visualized distal ureters and bladder appear
normal.

Bowel: No bowel wall thickening, distention or surrounding
inflammation.

Vascular/Lymphatic: Mild aortoiliac atherosclerosis, better seen on
CT. No enlarged pelvic lymph nodes.

Reproductive: The prostate gland and seminal vesicles appear normal.

Other:  Trace pelvic ascites of uncertain etiology.

Musculoskeletal: No evidence of acute pelvic fracture or hip
dislocation. No evidence of femoral head avascular necrosis. No
significant hip joint arthropathy or effusion. The sacroiliac joints
appear normal. As demonstrated previously, there is a transitional,
partially lumbarized S1 segment with a left-sided assimilation joint
without surrounding edema. There are endplate degenerative changes
asymmetric to the right at L5-S1 which are stable. The pelvic and
hip muscles and tendons appear normal.
IMPRESSION: 1. No acute findings or explanation for the patient's symptoms. The
left hip appears unremarkable.
2. Stable mild degenerative changes in the lower lumbar spine. No
acute osseous findings.
3. Trace pelvic ascites. The visualized internal pelvic contents are
otherwise unremarkable.

## 2019-12-06 ENCOUNTER — Other Ambulatory Visit (HOSPITAL_COMMUNITY)
Admission: RE | Admit: 2019-12-06 | Discharge: 2019-12-06 | Disposition: A | Discharge status: Home / Self Care | Payer: Medicaid Other | Source: Ambulatory Visit | Attending: Pulmonary Disease | Admitting: Pulmonary Disease

## 2019-12-06 NOTE — Progress Notes (Signed)
Called patient this afternoon. Pt. Lost appointment card, and didn't know when to come in.  I canceled today's appt. And rescheduled him for Monday at 10:20. Nothing further needed.

## 2019-12-09 ENCOUNTER — Other Ambulatory Visit (HOSPITAL_COMMUNITY)
Admission: RE | Admit: 2019-12-09 | Discharge: 2019-12-09 | Disposition: A | Discharge status: Home / Self Care | Payer: Medicaid Other | Source: Ambulatory Visit | Attending: Pulmonary Disease | Admitting: Pulmonary Disease

## 2019-12-09 NOTE — Progress Notes (Signed)
Please refer to previous progress note. Patient did not show for his appointment this morning. I called pt.'s home # first, it rang and rang and rang. Then I called his mobile, the recording came on "Please try again later."  I'll call pt. Between 12:30 and 1:15 so he'll have enough time to get his kids from school. Called patient again and was not able to get through on either line. I'll e-mail or call Dr.'s office and let them know. Nothing further needed.

## 2019-12-10 ENCOUNTER — Encounter (HOSPITAL_COMMUNITY): Payer: Medicaid Other

## 2019-12-10 ENCOUNTER — Inpatient Hospital Stay (HOSPITAL_COMMUNITY): Admission: RE | Admit: 2019-12-10 | Payer: Medicaid Other | Source: Ambulatory Visit

## 2019-12-16 ENCOUNTER — Ambulatory Visit: Payer: Medicaid Other | Admitting: Pulmonary Disease

## 2020-01-13 ENCOUNTER — Other Ambulatory Visit: Payer: Self-pay

## 2020-01-13 ENCOUNTER — Ambulatory Visit (INDEPENDENT_AMBULATORY_CARE_PROVIDER_SITE_OTHER): Payer: Medicaid Other | Admitting: Nurse Practitioner

## 2020-01-13 ENCOUNTER — Encounter: Payer: Self-pay | Admitting: Nurse Practitioner

## 2020-01-13 VITALS — BP 151/94 | HR 83 | Temp 98.6°F | Resp 16 | Ht 67.0 in | Wt 140.0 lb

## 2020-01-13 DIAGNOSIS — F1010 Alcohol abuse, uncomplicated: Secondary | ICD-10-CM

## 2020-01-13 DIAGNOSIS — L409 Psoriasis, unspecified: Secondary | ICD-10-CM | POA: Diagnosis not present

## 2020-01-13 DIAGNOSIS — I10 Essential (primary) hypertension: Secondary | ICD-10-CM | POA: Diagnosis not present

## 2020-01-13 DIAGNOSIS — M542 Cervicalgia: Secondary | ICD-10-CM

## 2020-01-13 DIAGNOSIS — J449 Chronic obstructive pulmonary disease, unspecified: Secondary | ICD-10-CM | POA: Diagnosis not present

## 2020-01-13 DIAGNOSIS — Z0000 Encounter for general adult medical examination without abnormal findings: Secondary | ICD-10-CM

## 2020-01-13 DIAGNOSIS — F17210 Nicotine dependence, cigarettes, uncomplicated: Secondary | ICD-10-CM

## 2020-01-13 MED ORDER — MELOXICAM 15 MG PO TABS: 15.0000 mg | ORAL_TABLET | Freq: Every day | ORAL | 2 refills | Status: DC

## 2020-01-13 MED ORDER — BREZTRI AEROSPHERE 160-9-4.8 MCG/ACT IN AERO: 2.0000 | INHALATION_SPRAY | Freq: Two times a day (BID) | RESPIRATORY_TRACT | 2 refills | Status: DC

## 2020-01-13 NOTE — Progress Notes (Signed)
New Patient Office Visit  Subjective:  Patient ID: Shane Allison, male    DOB: Jul 12, 1965  Age: 54 y.o. MRN: [id number]  CC:  Chief Complaint  Patient presents with  . New Patient (Initial Visit)    HPI Shane Allison presents to establish care. Transferring care from Dr. Luan Pulling. Last labs were completed over 3 months ago His medical history is listed below.  Today his is having some SOB and wheezing related to his COPD.      Past Medical History:  Diagnosis Date  . Alcohol use   . Asthma   . Cervical radiculopathy   . Chronic neck pain   . COPD (chronic obstructive pulmonary disease) ([place])   . GERD (gastroesophageal reflux disease)   . Hypertension   . Joint ache    Right AC joint separation  . Pneumonia   . Psoriasis   . Shortness of breath dyspnea     Past Surgical History:  Procedure Laterality Date  . ACROMIO-CLAVICULAR JOINT REPAIR Right 10/26/2017   Procedure: RIGHT ACROMIO-CLAVICULAR JOINT RECONSTRUCTION;  Surgeon: Meredith Pel, MD;  Location: [facility];  Service: Orthopedics;  Laterality: Right;  . BIOPSY  05/12/2015   Procedure: BIOPSY;  Surgeon: Danie Binder, MD;  [facility];  Service: Endoscopy;;  gastric biopsy  . BIOPSY  03/15/2016   Procedure: BIOPSY;  Surgeon: Danie Binder, MD;  [facility];  Service: Endoscopy;;  random colon bx's  . COLONOSCOPY WITH PROPOFOL N/A 03/15/2016   Procedure: COLONOSCOPY WITH PROPOFOL;  Surgeon: Danie Binder, MD;  [facility];  Service: Endoscopy;  Laterality: N/A;  12:30 PM  . ESOPHAGOGASTRODUODENOSCOPY (EGD) WITH PROPOFOL N/A 05/12/2015   Dr. Oneida Alar: normal esophagus, gastritis, bleeding friable duodenal mucosa  . HAND TENDON SURGERY Right   . MULTIPLE TOOTH EXTRACTIONS    . POLYPECTOMY  03/15/2016   Procedure: POLYPECTOMY;  Surgeon: Danie Binder, MD;  [facility];  Service: Endoscopy;;  rectal polyp  . THUMB AMPUTATION     Partial left thumb      Family History  Problem Relation Age of Onset  . COPD Mother   . Colon cancer Neg Hx     Social History   Socioeconomic History  . Marital status: Single    Spouse name: Not on file  . Number of children: Not on file  . Years of education: Not on file  . Highest education level: Not on file  Occupational History  . Not on file  Tobacco Use  . Smoking status: Current Every Day Smoker    Packs/day: 2.00    Years: 40.00    Pack years: 80.00    Types: Cigarettes  . Smokeless tobacco: Never Used  Vaping Use  . Vaping Use: Never used  Substance and Sexual Activity  . Alcohol use: Yes    Comment: PATIENT REPORT 6 PACK OF BEER a day  . Drug use: No  . Sexual activity: Not Currently    Birth control/protection: None  Other Topics Concern  . Not on file  Social History Narrative  . Not on file   Social Determinants of Health   Financial Resource Strain:   . Difficulty of Paying Living Expenses: Not on file  Food Insecurity:   . Worried About Charity fundraiser in the Last Year: Not on file  . Ran Out of Food in the Last Year: Not on file  Transportation Needs:   . Lack  of Transportation (Medical): Not on file  . Lack of Transportation (Non-Medical): Not on file  Physical Activity:   . Days of Exercise per Week: Not on file  . Minutes of Exercise per Session: Not on file  Stress:   . Feeling of Stress : Not on file  Social Connections:   . Frequency of Communication with Friends and Family: Not on file  . Frequency of Social Gatherings with Friends and Family: Not on file  . Attends Religious Services: Not on file  . Active Member of Clubs or Organizations: Not on file  . Attends Archivist Meetings: Not on file  . Marital Status: Not on file  Intimate Partner Violence:   . Fear of Current or Ex-Partner: Not on file  . Emotionally Abused: Not on file  . Physically Abused: Not on file  . Sexually Abused: Not on file    ROS Review of Systems   Constitutional: Negative.   Respiratory: Positive for shortness of breath and wheezing. Negative for cough and chest tightness.   Cardiovascular: Negative.   Neurological: Negative.   Psychiatric/Behavioral: Negative.     Objective:   Today's Vitals: BP (!) 151/94   Pulse 83   Temp 98.6 F (37 C)   Resp 16   Ht 5\' 7"  (1.702 m)   Wt 140 lb (63.5 kg)   SpO2 (!) 83%   BMI 21.93 kg/m   Physical Exam Constitutional:      Appearance: Normal appearance.  Cardiovascular:     Rate and Rhythm: Normal rate and regular rhythm.     Pulses: Normal pulses.     Heart sounds: Normal heart sounds.  Pulmonary:     Effort: No respiratory distress.     Breath sounds: Wheezing present. No rhonchi or rales.  Neurological:     General: No focal deficit present.     Mental Status: He is alert and oriented to person, place, and time.     Assessment & Plan:   Problem List Items Addressed This Visit      Cardiovascular and Mediastinum   Hypertension, essential     Respiratory   COPD (chronic obstructive pulmonary disease) ([place]) - Primary    Other Visit Diagnoses    Psoriasis         Psoriasis -followed by dermatology, Dr. Lyman Speller with [facility] -takes [facility] TB testing monitored by Dr. Lyman Speller  COPD -followed by Dr. Elsworth Soho with pulmonology -he states he does not take anoro, because the powder causes problems with his breathing; he states it puts a bad taste in his mouth and he coughs significantly after using this -uses albuterol PRN -he was supposed to have follow-up pulmonary function tests at the hospital, but he states he did not go to this -Rx. Breztri; sample card provided today and he staets he has Film/video editor -if Judithann Sauger is expensive, will consider Stioloto   HTN -BP 151/94 today -takes losartan 50 mg daily -he states he smoked just before coming in for BP check -if elevated at next visit, will consider changing meds  Neck pain -was previously taking  tylenol #3 with codeine q8h PRN, but that stopped and he is just taking tylenol  -followed by Dr. Marlou Sa -per patient, he had MRI that showed pinched nerve -he states that he has been taking up to 16 of the 500 mg tylenol tabs for his pain -we discussed cutting this down to 6 tabs per day; no more than 3000 mg per day -Rx. meloxicam  Nicotine Use -1 ppd smoker -not interested in quitting today  Alcohol Use disorder -drinks at least 3 beers per night -not interested in stopping today   Outpatient Encounter Medications as of 01/13/2020  Medication Sig  . albuterol (VENTOLIN HFA) 108 (90 Base) MCG/ACT inhaler Inhale 2 puffs into the lungs every 6 (six) hours as needed for wheezing or shortness of breath.  . losartan (COZAAR) 50 MG tablet Take 1 tablet (50 mg total) by mouth daily.  Marland Kitchen TALTZ 80 MG/ML SOAJ Inject 80 mg into the skin every 30 (thirty) days.   Marland Kitchen umeclidinium-vilanterol (ANORO ELLIPTA) 62.5-25 MCG/INH AEPB Inhale 1 puff into the lungs daily.  . [DISCONTINUED] acetaminophen-codeine (TYLENOL #3) 300-30 MG tablet Take 1 tablet by mouth every 8 (eight) hours as needed for moderate pain.   Facility-Administered Encounter Medications as of 01/13/2020  Medication  . ondansetron (ZOFRAN) 4 mg in sodium chloride 0.9 % 50 mL IVPB    Follow-up: Return in about 1 week (around 01/20/2020) for Annual Physical Exam.   Noreene Larsson, NP

## 2020-01-13 NOTE — Patient Instructions (Signed)
Psoriasis -followed by dermatology, Dr. Lyman Speller with [facility] -takes [facility] TB testing monitored by Dr. Lyman Speller  COPD -followed by Dr. Elsworth Soho with pulmonology -he states he does not take anoro, because the powder causes problems with his breathing -uses albuterol PRN -he was supposed to have follow-up pulmonary function tests at the hospital, but he states he did not go to this -Rx. Breztri; sample card provided today  HTN -BP 151/94 today -takes losartan 50 mg daily -he states he smoked just before coming in for BP check -if elevated at next visit, will consider changing meds  Neck pain -was previously taking tylenol #3 with codeine q8h PRN, but that stopped and he is just taking tylenol  -followed by Dr. Marlou Sa -per patient, he had MRI that showed pinched nerve -he states that he has been taking up to 16 of the 500 mg tylenol tabs for his pain -we discussed cutting this down to 6 tabs per day; no more than 3000 mg per day -Rx. meloxicam

## 2020-01-14 DIAGNOSIS — Z0000 Encounter for general adult medical examination without abnormal findings: Secondary | ICD-10-CM | POA: Diagnosis not present

## 2020-01-14 DIAGNOSIS — R7301 Impaired fasting glucose: Secondary | ICD-10-CM | POA: Diagnosis not present

## 2020-01-14 DIAGNOSIS — I10 Essential (primary) hypertension: Secondary | ICD-10-CM | POA: Diagnosis not present

## 2020-01-14 DIAGNOSIS — R739 Hyperglycemia, unspecified: Secondary | ICD-10-CM | POA: Diagnosis not present

## 2020-01-15 LAB — CBC WITH DIFFERENTIAL/PLATELET
Basophils Absolute: 0.1 10*3/uL (ref 0.0–0.2)
Basos: 1 %
EOS (ABSOLUTE): 0.1 10*3/uL (ref 0.0–0.4)
Eos: 2 %
Hematocrit: 39.2 % (ref 37.5–51.0)
Hemoglobin: 13.3 g/dL (ref 13.0–17.7)
Immature Grans (Abs): 0.1 10*3/uL (ref 0.0–0.1)
Immature Granulocytes: 1 %
Lymphocytes Absolute: 2.3 10*3/uL (ref 0.7–3.1)
Lymphs: 29 %
MCH: 33.3 pg — ABNORMAL HIGH (ref 26.6–33.0)
MCHC: 33.9 g/dL (ref 31.5–35.7)
MCV: 98 fL — ABNORMAL HIGH (ref 79–97)
Monocytes Absolute: 0.7 10*3/uL (ref 0.1–0.9)
Monocytes: 9 %
Neutrophils Absolute: 4.6 10*3/uL (ref 1.4–7.0)
Neutrophils: 58 %
Platelets: 453 10*3/uL — ABNORMAL HIGH (ref 150–450)
RBC: 4 x10E6/uL — ABNORMAL LOW (ref 4.14–5.80)
RDW: 11.9 % (ref 11.6–15.4)
WBC: 7.8 10*3/uL (ref 3.4–10.8)

## 2020-01-15 LAB — CMP14+EGFR
ALT: 13 IU/L (ref 0–44)
AST: 16 IU/L (ref 0–40)
Albumin/Globulin Ratio: 1.5 (ref 1.2–2.2)
Albumin: 4.5 g/dL (ref 3.8–4.9)
Alkaline Phosphatase: 85 IU/L (ref 44–121)
BUN/Creatinine Ratio: 13 (ref 9–20)
BUN: 12 mg/dL (ref 6–24)
Bilirubin Total: 0.3 mg/dL (ref 0.0–1.2)
CO2: 22 mmol/L (ref 20–29)
Calcium: 9.9 mg/dL (ref 8.7–10.2)
Chloride: 93 mmol/L — ABNORMAL LOW (ref 96–106)
Creatinine, Ser: 0.93 mg/dL (ref 0.76–1.27)
GFR calc Af Amer: 109 mL/min/{1.73_m2} (ref >59)
GFR calc non Af Amer: 94 mL/min/{1.73_m2} (ref >59)
Globulin, Total: 3.1 g/dL (ref 1.5–4.5)
Glucose: 106 mg/dL — ABNORMAL HIGH (ref 65–99)
Potassium: 5.2 mmol/L (ref 3.5–5.2)
Sodium: 130 mmol/L — ABNORMAL LOW (ref 134–144)
Total Protein: 7.6 g/dL (ref 6.0–8.5)

## 2020-01-15 LAB — LIPID PANEL WITH LDL/HDL RATIO
Cholesterol, Total: 161 mg/dL (ref 100–199)
HDL: 67 mg/dL (ref >39)
LDL Chol Calc (NIH): 79 mg/dL (ref 0–99)
LDL/HDL Ratio: 1.2 ratio (ref 0.0–3.6)
Triglycerides: 81 mg/dL (ref 0–149)
VLDL Cholesterol Cal: 15 mg/dL (ref 5–40)

## 2020-01-20 ENCOUNTER — Encounter: Payer: Medicaid Other | Admitting: Nurse Practitioner

## 2020-01-22 ENCOUNTER — Encounter: Payer: Self-pay | Admitting: Nurse Practitioner

## 2020-01-22 ENCOUNTER — Other Ambulatory Visit: Payer: Self-pay

## 2020-01-22 ENCOUNTER — Ambulatory Visit (INDEPENDENT_AMBULATORY_CARE_PROVIDER_SITE_OTHER): Payer: Medicaid Other | Admitting: Nurse Practitioner

## 2020-01-22 VITALS — BP 153/104 | HR 88 | Temp 98.8°F | Resp 20 | Ht 67.0 in | Wt 142.0 lb

## 2020-01-22 DIAGNOSIS — J432 Centrilobular emphysema: Secondary | ICD-10-CM | POA: Diagnosis not present

## 2020-01-22 DIAGNOSIS — M542 Cervicalgia: Secondary | ICD-10-CM | POA: Diagnosis not present

## 2020-01-22 DIAGNOSIS — L409 Psoriasis, unspecified: Secondary | ICD-10-CM

## 2020-01-22 DIAGNOSIS — I10 Essential (primary) hypertension: Secondary | ICD-10-CM | POA: Diagnosis not present

## 2020-01-22 DIAGNOSIS — F1010 Alcohol abuse, uncomplicated: Secondary | ICD-10-CM

## 2020-01-22 DIAGNOSIS — E871 Hypo-osmolality and hyponatremia: Secondary | ICD-10-CM | POA: Diagnosis not present

## 2020-01-22 DIAGNOSIS — Z720 Tobacco use: Secondary | ICD-10-CM

## 2020-01-22 DIAGNOSIS — R7301 Impaired fasting glucose: Secondary | ICD-10-CM

## 2020-01-22 MED ORDER — STIOLTO RESPIMAT 2.5-2.5 MCG/ACT IN AERS: 2.0000 | INHALATION_SPRAY | Freq: Every day | RESPIRATORY_TRACT | 3 refills | Status: DC

## 2020-01-22 MED ORDER — LOSARTAN POTASSIUM 100 MG PO TABS: 100.0000 mg | ORAL_TABLET | Freq: Every day | ORAL | 3 refills | Status: DC

## 2020-01-22 NOTE — Assessment & Plan Note (Signed)
-  BP 153/104 today; was elevated at last visit as well -INCREASE losartan to 100 mg daily -recheck BP in 1-2 weeks

## 2020-01-22 NOTE — Progress Notes (Signed)
Established Patient Office Visit  Subjective:  Patient ID: Shane Allison, male    DOB: 14-Feb-1966  Age: 54 y.o. MRN: [id number]  CC:  Chief Complaint  Patient presents with  . Annual Exam    HPI Shane Allison presents for annual physical exam. No acute complaints.  Past Medical History:  Diagnosis Date  . Alcohol use   . Asthma   . Cervical radiculopathy   . Chronic neck pain   . COPD (chronic obstructive pulmonary disease) ([place])   . GERD (gastroesophageal reflux disease)   . Hypertension   . Joint ache    Right AC joint separation  . Pneumonia   . Psoriasis   . Shortness of breath dyspnea     Past Surgical History:  Procedure Laterality Date  . ACROMIO-CLAVICULAR JOINT REPAIR Right 10/26/2017   Procedure: RIGHT ACROMIO-CLAVICULAR JOINT RECONSTRUCTION;  Surgeon: Meredith Pel, MD;  Location: [facility];  Service: Orthopedics;  Laterality: Right;  . BIOPSY  05/12/2015   Procedure: BIOPSY;  Surgeon: Danie Binder, MD;  [facility];  Service: Endoscopy;;  gastric biopsy  . BIOPSY  03/15/2016   Procedure: BIOPSY;  Surgeon: Danie Binder, MD;  [facility];  Service: Endoscopy;;  random colon bx's  . COLONOSCOPY WITH PROPOFOL N/A 03/15/2016   Procedure: COLONOSCOPY WITH PROPOFOL;  Surgeon: Danie Binder, MD;  [facility];  Service: Endoscopy;  Laterality: N/A;  12:30 PM  . ESOPHAGOGASTRODUODENOSCOPY (EGD) WITH PROPOFOL N/A 05/12/2015   Dr. Oneida Alar: normal esophagus, gastritis, bleeding friable duodenal mucosa  . HAND TENDON SURGERY Right   . MULTIPLE TOOTH EXTRACTIONS    . POLYPECTOMY  03/15/2016   Procedure: POLYPECTOMY;  Surgeon: Danie Binder, MD;  [facility];  Service: Endoscopy;;  rectal polyp  . THUMB AMPUTATION     Partial left thumb     Family History  Problem Relation Age of Onset  . COPD Mother   . Colon cancer Neg Hx     Social History   Socioeconomic History  . Marital status: Single    Spouse  name: Not on file  . Number of children: Not on file  . Years of education: Not on file  . Highest education level: Not on file  Occupational History  . Not on file  Tobacco Use  . Smoking status: Current Every Day Smoker    Packs/day: 2.00    Years: 40.00    Pack years: 80.00    Types: Cigarettes  . Smokeless tobacco: Never Used  Vaping Use  . Vaping Use: Never used  Substance and Sexual Activity  . Alcohol use: Yes    Comment: PATIENT REPORT 6 PACK OF BEER a day  . Drug use: No  . Sexual activity: Not Currently    Birth control/protection: None  Other Topics Concern  . Not on file  Social History Narrative  . Not on file   Social Determinants of Health   Financial Resource Strain:   . Difficulty of Paying Living Expenses: Not on file  Food Insecurity:   . Worried About Charity fundraiser in the Last Year: Not on file  . Ran Out of Food in the Last Year: Not on file  Transportation Needs:   . Lack of Transportation (Medical): Not on file  . Lack of Transportation (Non-Medical): Not on file  Physical Activity:   . Days of Exercise per Week: Not on file  . Minutes of Exercise  per Session: Not on file  Stress:   . Feeling of Stress : Not on file  Social Connections:   . Frequency of Communication with Friends and Family: Not on file  . Frequency of Social Gatherings with Friends and Family: Not on file  . Attends Religious Services: Not on file  . Active Member of Clubs or Organizations: Not on file  . Attends Archivist Meetings: Not on file  . Marital Status: Not on file  Intimate Partner Violence:   . Fear of Current or Ex-Partner: Not on file  . Emotionally Abused: Not on file  . Physically Abused: Not on file  . Sexually Abused: Not on file    Outpatient Medications Prior to Visit  Medication Sig Dispense Refill  . albuterol (VENTOLIN HFA) 108 (90 Base) MCG/ACT inhaler Inhale 2 puffs into the lungs every 6 (six) hours as needed for wheezing or  shortness of breath. 18 g 2  . Budeson-Glycopyrrol-Formoterol (BREZTRI AEROSPHERE) 160-9-4.8 MCG/ACT AERO Inhale 2 puffs into the lungs in the morning and at bedtime. 10.7 g 2  . losartan (COZAAR) 50 MG tablet Take 1 tablet (50 mg total) by mouth daily. 30 tablet 2  . meloxicam (MOBIC) 15 MG tablet Take 1 tablet (15 mg total) by mouth daily. 30 tablet 2  . TALTZ 80 MG/ML SOAJ Inject 80 mg into the skin every 30 (thirty) days.   9   Facility-Administered Medications Prior to Visit  Medication Dose Route Frequency Provider Last Rate Last Admin  . ondansetron (ZOFRAN) 4 mg in sodium chloride 0.9 % 50 mL IVPB  4 mg Intravenous Q6H PRN Ashok Pall, MD        Allergies  Allergen Reactions  . Penicillins Rash    Has patient had a PCN reaction causing immediate rash, facial/tongue/throat swelling, SOB or lightheadedness with hypotension: Yes Has patient had a PCN reaction causing severe rash involving mucus membranes or skin necrosis: No Has patient had a PCN reaction that required hospitalization No Has patient had a PCN reaction occurring within the last 10 years: No If all of the above answers are "NO", then may proceed with Cephalosporin use.      ROS Review of Systems  Constitutional: Negative.   HENT: Negative.   Eyes: Negative.   Respiratory: Positive for cough and wheezing.   Cardiovascular: Negative.   Gastrointestinal: Negative.   Endocrine: Negative.   Genitourinary: Negative.   Musculoskeletal: Negative.   Skin: Negative.   Allergic/Immunologic: Positive for immunocompromised state.       Does not take vaccinations because he is taking Taltz  Neurological: Negative.   Hematological: Negative.   Psychiatric/Behavioral: Negative.       Objective:    Physical Exam Constitutional:      Appearance: Normal appearance.  HENT:     Head: Normocephalic and atraumatic.     Right Ear: Tympanic membrane, ear canal and external ear normal.     Left Ear: Tympanic membrane,  ear canal and external ear normal.     Nose: Nose normal.     Mouth/Throat:     Mouth: Mucous membranes are moist.     Pharynx: Oropharynx is clear.  Eyes:     Extraocular Movements: Extraocular movements intact.     Conjunctiva/sclera: Conjunctivae normal.     Pupils: Pupils are equal, round, and reactive to light.  Cardiovascular:     Rate and Rhythm: Normal rate and regular rhythm.     Pulses: Normal pulses.  Heart sounds: Normal heart sounds.  Pulmonary:     Effort: No respiratory distress.     Breath sounds: Wheezing and rhonchi present.  Abdominal:     General: Abdomen is flat. Bowel sounds are normal.  Musculoskeletal:        General: Normal range of motion.     Cervical back: Normal range of motion and neck supple.  Skin:    General: Skin is warm and dry.     Capillary Refill: Capillary refill takes less than 2 seconds.  Neurological:     General: No focal deficit present.     Mental Status: He is alert and oriented to person, place, and time.  Psychiatric:        Mood and Affect: Mood normal.        Behavior: Behavior normal.        Thought Content: Thought content normal.        Judgment: Judgment normal.     BP (!) 153/104   Pulse 88   Temp 98.8 F (37.1 C)   Resp 20   Ht 5\' 7"  (1.702 m)   Wt 142 lb (64.4 kg)   SpO2 96%   BMI 22.24 kg/m  Wt Readings from Last 3 Encounters:  01/22/20 142 lb (64.4 kg)  01/13/20 140 lb (63.5 kg)  09/03/19 139 lb (63 kg)     Health Maintenance Due  Topic Date Due  . COVID-19 Vaccine (1) Never done  . HIV Screening  Never done  . TETANUS/TDAP  Never done  . INFLUENZA VACCINE  Never done    There are no preventive care reminders to display for this patient.  No results found for: TSH Lab Results  Component Value Date   WBC 7.8 01/14/2020   HGB 13.3 01/14/2020   HCT 39.2 01/14/2020   MCV 98 (H) 01/14/2020   PLT 453 (H) 01/14/2020   Lab Results  Component Value Date   NA 130 (L) 01/14/2020   K 5.2  01/14/2020   CO2 22 01/14/2020   GLUCOSE 106 (H) 01/14/2020   BUN 12 01/14/2020   CREATININE 0.93 01/14/2020   BILITOT 0.3 01/14/2020   ALKPHOS 85 01/14/2020   AST 16 01/14/2020   ALT 13 01/14/2020   PROT 7.6 01/14/2020   ALBUMIN 4.5 01/14/2020   CALCIUM 9.9 01/14/2020   ANIONGAP 13 10/26/2017   Lab Results  Component Value Date   CHOL 161 01/14/2020   Lab Results  Component Value Date   HDL 67 01/14/2020   Lab Results  Component Value Date   LDLCALC 79 01/14/2020   Lab Results  Component Value Date   TRIG 81 01/14/2020   No results found for: CHOLHDL Lab Results  Component Value Date   HGBA1C 5.3 01/14/2020      Assessment & Plan:   Problem List Items Addressed This Visit      Cardiovascular and Mediastinum   Hypertension, essential    -BP 153/104 today; was elevated at last visit as well -INCREASE losartan to 100 mg daily -recheck BP in 1-2 weeks        Respiratory   COPD (chronic obstructive pulmonary disease) ([facility])     -no longer followed by Dr. Elsworth Soho with pulmonology -he states he does not take anoro, because the powder causes problems with his breathing; he states it puts a bad taste in his mouth and he coughs significantly after using this -uses albuterol PRN -he was supposed to have follow-up pulmonary function tests at  the hospital, but he states he did not go to this -He was prescribed Breztri at last OV; sample card provided today but he states he has Medicaid and it will not cover Breztri -Rx. Stioloto          Musculoskeletal and Integument   Psoriasis    Psoriasis -followed by dermatology, Dr. Lyman Speller with [facility] -takes Taltz TB testing monitored by Dr. Lyman Speller -he states he is unable to take vaccinations d/t taking taltz        Other   Alcohol abuse    -drinks at least 3 beers per night -not interested in stopping today       Hyponatremia - Primary    -mild today -discussed increasing PO intake of salt to increase sodium  and chloride -will monitor with routine labs      Tobacco abuse     -1 ppd smoker -not interested in quitting today      Neck pain    -was previously taking tylenol #3 with codeine q8h PRN, but that stopped and he is just taking tylenol  -followed by Dr. Marlou Sa -per patient, he had MRI that showed pinched nerve -he states that he has been taking up to 16 of the 500 mg tylenol tabs for his pain -we discussed cutting this down to 6 tabs per day; no more than 3000 mg per day -was prescribed meloxicam at last OV, and this has been helping; down to 6 tylenol per day          No orders of the defined types were placed in this encounter.    Follow-up: Return in about 2 weeks (around 02/05/2020) for Blood pressure check; 4 months for full set of labs.    Noreene Larsson, NP

## 2020-01-22 NOTE — Assessment & Plan Note (Signed)
-  drinks at least 3 beers per night -not interested in stopping today

## 2020-01-22 NOTE — Assessment & Plan Note (Signed)
-  no longer followed by Dr. Elsworth Soho with pulmonology -he states he does not take anoro, because the powder causes problems with his breathing; he states it puts a bad taste in his mouth and he coughs significantly after using this -uses albuterol PRN -he was supposed to have follow-up pulmonary function tests at the hospital, but he states he did not go to this -He was prescribed Breztri at last OV; sample card provided today but he states he has Medicaid and it will not cover [place] -Rx. Stioloto

## 2020-01-22 NOTE — Assessment & Plan Note (Signed)
Psoriasis -followed by dermatology, Dr. Lyman Speller with [facility] -takes Taltz TB testing monitored by Dr. Lyman Speller -he states he is unable to take vaccinations d/t taking taltz

## 2020-01-22 NOTE — Assessment & Plan Note (Signed)
-  was previously taking tylenol #3 with codeine q8h PRN, but that stopped and he is just taking tylenol  -followed by Dr. Marlou Sa -per patient, he had MRI that showed pinched nerve -he states that he has been taking up to 16 of the 500 mg tylenol tabs for his pain -we discussed cutting this down to 6 tabs per day; no more than 3000 mg per day -was prescribed meloxicam at last OV, and this has been helping; down to 6 tylenol per day

## 2020-01-22 NOTE — Assessment & Plan Note (Signed)
-  mild today -discussed increasing PO intake of salt to increase sodium and chloride -will monitor with routine labs

## 2020-01-22 NOTE — Patient Instructions (Signed)
We will see you back in 1-2 weeks for a BP check since we changed your dose of losartan.  I send in a prescription for losartan 100 mg. Take this as directed.  I also sent in [place] which is preferred on the [facility] Medicaid formulary.  If there are any issues with affordability, please let me know.

## 2020-01-22 NOTE — Assessment & Plan Note (Signed)
-  1 ppd smoker -not interested in quitting today

## 2020-01-24 LAB — HGB A1C W/O EAG: Hgb A1c MFr Bld: 5.3 % (ref 4.8–5.6)

## 2020-01-24 LAB — SPECIMEN STATUS REPORT

## 2020-02-03 ENCOUNTER — Other Ambulatory Visit: Payer: Self-pay

## 2020-02-03 ENCOUNTER — Ambulatory Visit (INDEPENDENT_AMBULATORY_CARE_PROVIDER_SITE_OTHER): Payer: Medicaid Other | Admitting: Nurse Practitioner

## 2020-02-03 ENCOUNTER — Encounter: Payer: Self-pay | Admitting: Nurse Practitioner

## 2020-02-03 VITALS — BP 186/116 | HR 72 | Temp 97.7°F | Resp 22 | Ht 67.0 in | Wt 143.0 lb

## 2020-02-03 DIAGNOSIS — M542 Cervicalgia: Secondary | ICD-10-CM | POA: Diagnosis not present

## 2020-02-03 DIAGNOSIS — I10 Essential (primary) hypertension: Secondary | ICD-10-CM

## 2020-02-03 MED ORDER — AMLODIPINE BESYLATE 5 MG PO TABS: 5.0000 mg | ORAL_TABLET | Freq: Every day | ORAL | 3 refills | Status: DC

## 2020-02-03 NOTE — Patient Instructions (Signed)
We will meet back up in 2 weeks for a blood pressure check. Please start the amlodipine for your BP.  Contact Dr. Marlou Sa for your neck pain, so you can stop taking so much tylenol.

## 2020-02-03 NOTE — Progress Notes (Signed)
Acute Office Visit  Subjective:    Patient ID: Shane Allison, male    DOB: 07-04-1965, 54 y.o.   MRN: [id number]  Chief Complaint  Patient presents with  . Hypertension    HPI Patient is in today for BP check.  He states that he is taking 2 of his 50 mg losartan tabs daily.  He has not smoked within 30 minutes of his appointment. He is not checking his BP at home.  Past Medical History:  Diagnosis Date  . Alcohol use   . Asthma   . Cervical radiculopathy   . Chronic neck pain   . COPD (chronic obstructive pulmonary disease) ([place])   . GERD (gastroesophageal reflux disease)   . Hypertension   . Joint ache    Right AC joint separation  . Pneumonia   . Psoriasis   . Shortness of breath dyspnea     Past Surgical History:  Procedure Laterality Date  . ACROMIO-CLAVICULAR JOINT REPAIR Right 10/26/2017   Procedure: RIGHT ACROMIO-CLAVICULAR JOINT RECONSTRUCTION;  Surgeon: Meredith Pel, MD;  Location: [place];  Service: Orthopedics;  Laterality: Right;  . BIOPSY  05/12/2015   Procedure: BIOPSY;  Surgeon: Danie Binder, MD;  [facility];  Service: Endoscopy;;  gastric biopsy  . BIOPSY  03/15/2016   Procedure: BIOPSY;  Surgeon: Danie Binder, MD;  [facility];  Service: Endoscopy;;  random colon bx's  . COLONOSCOPY WITH PROPOFOL N/A 03/15/2016   Procedure: COLONOSCOPY WITH PROPOFOL;  Surgeon: Danie Binder, MD;  [facility];  Service: Endoscopy;  Laterality: N/A;  12:30 PM  . ESOPHAGOGASTRODUODENOSCOPY (EGD) WITH PROPOFOL N/A 05/12/2015   Dr. Oneida Alar: normal esophagus, gastritis, bleeding friable duodenal mucosa  . HAND TENDON SURGERY Right   . MULTIPLE TOOTH EXTRACTIONS    . POLYPECTOMY  03/15/2016   Procedure: POLYPECTOMY;  Surgeon: Danie Binder, MD;  [facility];  Service: Endoscopy;;  rectal polyp  . THUMB AMPUTATION     Partial left thumb     Family History  Problem Relation Age of Onset  . COPD Mother   . Colon  cancer Neg Hx     Social History   Socioeconomic History  . Marital status: Single    Spouse name: Not on file  . Number of children: Not on file  . Years of education: Not on file  . Highest education level: Not on file  Occupational History  . Not on file  Tobacco Use  . Smoking status: Current Every Day Smoker    Packs/day: 2.00    Years: 40.00    Pack years: 80.00    Types: Cigarettes  . Smokeless tobacco: Never Used  Vaping Use  . Vaping Use: Never used  Substance and Sexual Activity  . Alcohol use: Yes    Comment: PATIENT REPORT 6 PACK OF BEER a day  . Drug use: No  . Sexual activity: Not Currently    Birth control/protection: None  Other Topics Concern  . Not on file  Social History Narrative  . Not on file   Social Determinants of Health   Financial Resource Strain: Not on file  Food Insecurity: Not on file  Transportation Needs: Not on file  Physical Activity: Not on file  Stress: Not on file  Social Connections: Not on file  Intimate Partner Violence: Not on file    Outpatient Medications Prior to Visit  Medication Sig Dispense Refill  . albuterol (VENTOLIN  HFA) 108 (90 Base) MCG/ACT inhaler Inhale 2 puffs into the lungs every 6 (six) hours as needed for wheezing or shortness of breath. 18 g 2  . losartan (COZAAR) 100 MG tablet Take 1 tablet (100 mg total) by mouth daily. 90 tablet 3  . meloxicam (MOBIC) 15 MG tablet Take 1 tablet (15 mg total) by mouth daily. 30 tablet 2  . TALTZ 80 MG/ML SOAJ Inject 80 mg into the skin every 30 (thirty) days.   9  . Tiotropium Bromide-Olodaterol (STIOLTO RESPIMAT) 2.5-2.5 MCG/ACT AERS Inhale 2 puffs into the lungs daily. 1 each 3   Facility-Administered Medications Prior to Visit  Medication Dose Route Frequency Provider Last Rate Last Admin  . ondansetron (ZOFRAN) 4 mg in sodium chloride 0.9 % 50 mL IVPB  4 mg Intravenous Q6H PRN Ashok Pall, MD        Allergies  Allergen Reactions  . Penicillins Rash    Has  patient had a PCN reaction causing immediate rash, facial/tongue/throat swelling, SOB or lightheadedness with hypotension: Yes Has patient had a PCN reaction causing severe rash involving mucus membranes or skin necrosis: No Has patient had a PCN reaction that required hospitalization No Has patient had a PCN reaction occurring within the last 10 years: No If all of the above answers are "NO", then may proceed with Cephalosporin use.      Review of Systems  Constitutional: Negative.   Respiratory: Negative for cough, chest tightness, shortness of breath and wheezing.   Cardiovascular: Negative.   Musculoskeletal: Positive for neck pain.       Objective:    Physical Exam Constitutional:      Appearance: Normal appearance.  Cardiovascular:     Rate and Rhythm: Normal rate and regular rhythm.     Pulses: Normal pulses.     Heart sounds: Normal heart sounds.  Pulmonary:     Effort: Pulmonary effort is normal.     Breath sounds: Normal breath sounds.  Musculoskeletal:     Comments: Neck pain with ROM exercises.  Neurological:     Mental Status: He is alert.     BP (!) 186/116   Pulse 72   Temp 97.7 F (36.5 C)   Resp (!) 22   Ht 5\' 7"  (1.702 m)   Wt 143 lb (64.9 kg)   SpO2 98%   BMI 22.40 kg/m  Wt Readings from Last 3 Encounters:  02/03/20 143 lb (64.9 kg)  01/22/20 142 lb (64.4 kg)  01/13/20 140 lb (63.5 kg)    Health Maintenance Due  Topic Date Due  . COVID-19 Vaccine (1) Never done  . HIV Screening  Never done  . TETANUS/TDAP  Never done  . INFLUENZA VACCINE  Never done    There are no preventive care reminders to display for this patient.   No results found for: TSH Lab Results  Component Value Date   WBC 7.8 01/14/2020   HGB 13.3 01/14/2020   HCT 39.2 01/14/2020   MCV 98 (H) 01/14/2020   PLT 453 (H) 01/14/2020   Lab Results  Component Value Date   NA 130 (L) 01/14/2020   K 5.2 01/14/2020   CO2 22 01/14/2020   GLUCOSE 106 (H) 01/14/2020    BUN 12 01/14/2020   CREATININE 0.93 01/14/2020   BILITOT 0.3 01/14/2020   ALKPHOS 85 01/14/2020   AST 16 01/14/2020   ALT 13 01/14/2020   PROT 7.6 01/14/2020   ALBUMIN 4.5 01/14/2020   CALCIUM 9.9 01/14/2020  ANIONGAP 13 10/26/2017   Lab Results  Component Value Date   CHOL 161 01/14/2020   Lab Results  Component Value Date   HDL 67 01/14/2020   Lab Results  Component Value Date   LDLCALC 79 01/14/2020   Lab Results  Component Value Date   TRIG 81 01/14/2020   No results found for: CHOLHDL Lab Results  Component Value Date   HGBA1C 5.3 01/14/2020       Assessment & Plan:   Problem List Items Addressed This Visit      Cardiovascular and Mediastinum   Hypertension, essential - Primary    -at last OV losartan was increased to 100 mg per day; he states he has been compliant with this -Rx. Amlodipine -f/u for BP check in 2 weeks      Relevant Medications   amLODipine (NORVASC) 5 MG tablet     Other   Neck pain    -we seen by Dr. Marlou Sa earlier this year -he is taking 10 tylenols per days (500 mg each) d/t pain -we discussed that he should see Dr. Marlou Sa again, and he will call them; no referral required          Meds ordered this encounter  Medications  . amLODipine (NORVASC) 5 MG tablet    Sig: Take 1 tablet (5 mg total) by mouth daily.    Dispense:  90 tablet    Refill:  [place], NP

## 2020-02-03 NOTE — Assessment & Plan Note (Signed)
-  we seen by Dr. Marlou Sa earlier this year -he is taking 10 tylenols per days (500 mg each) d/t pain -we discussed that he should see Dr. Marlou Sa again, and he will call them; no referral required

## 2020-02-03 NOTE — Assessment & Plan Note (Signed)
-  at last OV losartan was increased to 100 mg per day; he states he has been compliant with this -Rx. Amlodipine -f/u for BP check in 2 weeks

## 2020-02-17 ENCOUNTER — Other Ambulatory Visit: Payer: Self-pay

## 2020-02-17 ENCOUNTER — Encounter: Payer: Self-pay | Admitting: Nurse Practitioner

## 2020-02-17 ENCOUNTER — Ambulatory Visit (INDEPENDENT_AMBULATORY_CARE_PROVIDER_SITE_OTHER): Payer: Medicaid Other | Admitting: Nurse Practitioner

## 2020-02-17 VITALS — BP 151/93 | HR 87 | Temp 98.3°F | Resp 20 | Ht 67.0 in | Wt 142.0 lb

## 2020-02-17 DIAGNOSIS — I10 Essential (primary) hypertension: Secondary | ICD-10-CM | POA: Diagnosis not present

## 2020-02-17 MED ORDER — OLMESARTAN-AMLODIPINE-HCTZ 40-5-12.5 MG PO TABS: 1.0000 | ORAL_TABLET | Freq: Every day | ORAL | 1 refills | Status: DC

## 2020-02-17 NOTE — Patient Instructions (Signed)
You were seen for a blood pressure check today.  We want your BP to be < 140/90.  Your BP was better today, but not at our goal.  I prescribed a new medication that is a combination of olmesartan, amlodipine, and hydrochlorothiazide, and that should help bring your BP into the desired range.  Please STOP your losartan and amlodipine, since the new combination pill will replace them.

## 2020-02-17 NOTE — Assessment & Plan Note (Addendum)
-  at last OV losartan was increased to 100 mg per day; he states he has been compliant with this -He was started on Amlodipine at last OV -STOP losartan -STOP amlodipine -Rx. olmesartan-amlodipine-HCTZ combo -f/u for BP check in 2 weeks

## 2020-02-17 NOTE — Progress Notes (Signed)
Acute Office Visit  Subjective:    Patient ID: Shane Allison, male    DOB: 04/18/1965, 54 y.o.   MRN: [id number]  Chief Complaint  Patient presents with  . Hypertension  . Follow-up    HPI Patient is in today for BP check.  At last OV, his BP was 186/116. He is taking amlodipine as well as losartan.  No adverse medication effects.  He does not check his BP at home.  Past Medical History:  Diagnosis Date  . Alcohol use   . Asthma   . Cervical radiculopathy   . Chronic neck pain   . COPD (chronic obstructive pulmonary disease) ([place])   . GERD (gastroesophageal reflux disease)   . Hypertension   . Joint ache    Right AC joint separation  . Pneumonia   . Psoriasis   . Shortness of breath dyspnea     Past Surgical History:  Procedure Laterality Date  . ACROMIO-CLAVICULAR JOINT REPAIR Right 10/26/2017   Procedure: RIGHT ACROMIO-CLAVICULAR JOINT RECONSTRUCTION;  Surgeon: Meredith Pel, MD;  Location: [facility];  Service: Orthopedics;  Laterality: Right;  . BIOPSY  05/12/2015   Procedure: BIOPSY;  Surgeon: Danie Binder, MD;  [facility];  Service: Endoscopy;;  gastric biopsy  . BIOPSY  03/15/2016   Procedure: BIOPSY;  Surgeon: Danie Binder, MD;  [facility];  Service: Endoscopy;;  random colon bx's  . COLONOSCOPY WITH PROPOFOL N/A 03/15/2016   Procedure: COLONOSCOPY WITH PROPOFOL;  Surgeon: Danie Binder, MD;  [facility];  Service: Endoscopy;  Laterality: N/A;  12:30 PM  . ESOPHAGOGASTRODUODENOSCOPY (EGD) WITH PROPOFOL N/A 05/12/2015   Dr. Oneida Alar: normal esophagus, gastritis, bleeding friable duodenal mucosa  . HAND TENDON SURGERY Right   . MULTIPLE TOOTH EXTRACTIONS    . POLYPECTOMY  03/15/2016   Procedure: POLYPECTOMY;  Surgeon: Danie Binder, MD;  [facility];  Service: Endoscopy;;  rectal polyp  . THUMB AMPUTATION     Partial left thumb     Family History  Problem Relation Age of Onset  . COPD Mother   . Colon  cancer Neg Hx     Social History   Socioeconomic History  . Marital status: Single    Spouse name: Not on file  . Number of children: Not on file  . Years of education: Not on file  . Highest education level: Not on file  Occupational History  . Not on file  Tobacco Use  . Smoking status: Current Every Day Smoker    Packs/day: 2.00    Years: 40.00    Pack years: 80.00    Types: Cigarettes  . Smokeless tobacco: Never Used  Vaping Use  . Vaping Use: Never used  Substance and Sexual Activity  . Alcohol use: Yes    Comment: PATIENT REPORT 6 PACK OF BEER a day  . Drug use: No  . Sexual activity: Not Currently    Birth control/protection: None  Other Topics Concern  . Not on file  Social History Narrative  . Not on file   Social Determinants of Health   Financial Resource Strain: Not on file  Food Insecurity: Not on file  Transportation Needs: Not on file  Physical Activity: Not on file  Stress: Not on file  Social Connections: Not on file  Intimate Partner Violence: Not on file    Outpatient Medications Prior to Visit  Medication Sig Dispense Refill  . albuterol (VENTOLIN HFA)  108 (90 Base) MCG/ACT inhaler Inhale 2 puffs into the lungs every 6 (six) hours as needed for wheezing or shortness of breath. 18 g 2  . meloxicam (MOBIC) 15 MG tablet Take 1 tablet (15 mg total) by mouth daily. 30 tablet 2  . TALTZ 80 MG/ML SOAJ Inject 80 mg into the skin every 30 (thirty) days.   9  . Tiotropium Bromide-Olodaterol (STIOLTO RESPIMAT) 2.5-2.5 MCG/ACT AERS Inhale 2 puffs into the lungs daily. 1 each 3  . amLODipine (NORVASC) 5 MG tablet Take 1 tablet (5 mg total) by mouth daily. 90 tablet 3  . losartan (COZAAR) 100 MG tablet Take 1 tablet (100 mg total) by mouth daily. 90 tablet 3   Facility-Administered Medications Prior to Visit  Medication Dose Route Frequency Provider Last Rate Last Admin  . ondansetron (ZOFRAN) 4 mg in sodium chloride 0.9 % 50 mL IVPB  4 mg Intravenous Q6H  PRN Ashok Pall, MD        Allergies  Allergen Reactions  . Penicillins Rash    Has patient had a PCN reaction causing immediate rash, facial/tongue/throat swelling, SOB or lightheadedness with hypotension: Yes Has patient had a PCN reaction causing severe rash involving mucus membranes or skin necrosis: No Has patient had a PCN reaction that required hospitalization No Has patient had a PCN reaction occurring within the last 10 years: No If all of the above answers are "NO", then may proceed with Cephalosporin use.      Review of Systems  Constitutional: Negative.   Respiratory: Positive for wheezing.   Cardiovascular: Negative.        Objective:    Physical Exam Constitutional:      Appearance: Normal appearance.  Cardiovascular:     Rate and Rhythm: Normal rate and regular rhythm.     Pulses: Normal pulses.     Heart sounds: Normal heart sounds.  Pulmonary:     Effort: Pulmonary effort is normal. No respiratory distress.     Breath sounds: No stridor. Wheezing present. No rhonchi or rales.  Chest:     Chest wall: No tenderness.  Neurological:     Mental Status: He is alert.     BP (!) 151/93   Pulse 87   Temp 98.3 F (36.8 C)   Resp 20   Ht 5\' 7"  (1.702 m)   Wt 142 lb (64.4 kg)   SpO2 98%   BMI 22.24 kg/m  Wt Readings from Last 3 Encounters:  02/17/20 142 lb (64.4 kg)  02/03/20 143 lb (64.9 kg)  01/22/20 142 lb (64.4 kg)    Health Maintenance Due  Topic Date Due  . COVID-19 Vaccine (1) Never done  . HIV Screening  Never done  . TETANUS/TDAP  Never done  . INFLUENZA VACCINE  Never done    There are no preventive care reminders to display for this patient.   No results found for: TSH Lab Results  Component Value Date   WBC 7.8 01/14/2020   HGB 13.3 01/14/2020   HCT 39.2 01/14/2020   MCV 98 (H) 01/14/2020   PLT 453 (H) 01/14/2020   Lab Results  Component Value Date   NA 130 (L) 01/14/2020   K 5.2 01/14/2020   CO2 22 01/14/2020    GLUCOSE 106 (H) 01/14/2020   BUN 12 01/14/2020   CREATININE 0.93 01/14/2020   BILITOT 0.3 01/14/2020   ALKPHOS 85 01/14/2020   AST 16 01/14/2020   ALT 13 01/14/2020   PROT 7.6 01/14/2020  ALBUMIN 4.5 01/14/2020   CALCIUM 9.9 01/14/2020   ANIONGAP 13 10/26/2017   Lab Results  Component Value Date   CHOL 161 01/14/2020   Lab Results  Component Value Date   HDL 67 01/14/2020   Lab Results  Component Value Date   LDLCALC 79 01/14/2020   Lab Results  Component Value Date   TRIG 81 01/14/2020   No results found for: CHOLHDL Lab Results  Component Value Date   HGBA1C 5.3 01/14/2020       Assessment & Plan:   Problem List Items Addressed This Visit      Cardiovascular and Mediastinum   Hypertension, essential - Primary    -at last OV losartan was increased to 100 mg per day; he states he has been compliant with this -He was started on Amlodipine at last OV -STOP losartan -STOP amlodipine -Rx. olmesartan-amlodipine-HCTZ combo -f/u for BP check in 2 weeks      Relevant Medications   Olmesartan-amLODIPine-HCTZ 40-5-12.5 MG TABS       Meds ordered this encounter  Medications  . Olmesartan-amLODIPine-HCTZ 40-5-12.5 MG TABS    Sig: Take 1 tablet by mouth daily.    Dispense:  90 tablet    Refill:  [place], NP

## 2020-02-19 ENCOUNTER — Telehealth: Payer: Self-pay | Admitting: *Deleted

## 2020-02-19 ENCOUNTER — Other Ambulatory Visit: Payer: Self-pay

## 2020-02-19 DIAGNOSIS — L409 Psoriasis, unspecified: Secondary | ICD-10-CM

## 2020-02-19 NOTE — Telephone Encounter (Signed)
Pt called would like a referral to dr [facility] he had saw them before but his referral had already expired please advise

## 2020-02-19 NOTE — Telephone Encounter (Signed)
Okay for referral?

## 2020-02-19 NOTE — Telephone Encounter (Signed)
I spoke with pt he was seen there previously for psoriasis. Referral has been sent. Pt informed.

## 2020-02-19 NOTE — Telephone Encounter (Signed)
Sure. I don't know what condition he is being seen by them for.

## 2020-03-02 ENCOUNTER — Encounter: Payer: Self-pay | Admitting: Nurse Practitioner

## 2020-03-02 ENCOUNTER — Other Ambulatory Visit: Payer: Self-pay

## 2020-03-02 ENCOUNTER — Ambulatory Visit (INDEPENDENT_AMBULATORY_CARE_PROVIDER_SITE_OTHER): Payer: Medicaid Other | Admitting: Nurse Practitioner

## 2020-03-02 VITALS — BP 130/77 | HR 90 | Temp 98.3°F | Resp 22 | Ht 67.0 in | Wt 143.0 lb

## 2020-03-02 DIAGNOSIS — I10 Essential (primary) hypertension: Secondary | ICD-10-CM | POA: Diagnosis not present

## 2020-03-02 DIAGNOSIS — R252 Cramp and spasm: Secondary | ICD-10-CM

## 2020-03-02 DIAGNOSIS — E871 Hypo-osmolality and hyponatremia: Secondary | ICD-10-CM

## 2020-03-02 DIAGNOSIS — J432 Centrilobular emphysema: Secondary | ICD-10-CM | POA: Diagnosis not present

## 2020-03-02 MED ORDER — ALBUTEROL SULFATE HFA 108 (90 BASE) MCG/ACT IN AERS: 2.0000 | INHALATION_SPRAY | Freq: Four times a day (QID) | RESPIRATORY_TRACT | 2 refills | Status: DC | PRN

## 2020-03-02 NOTE — Assessment & Plan Note (Signed)
-  BP looks great today -he has some cramping after starting amlodipine-olmesartan-HCTZ -will check BMP and magnesium; may have low K or Mg after starting HCTZ

## 2020-03-02 NOTE — Assessment & Plan Note (Addendum)
-  refilled albuterol today -prescribed Stiolto at last OV

## 2020-03-02 NOTE — Progress Notes (Signed)
Acute Office Visit  Subjective:    Patient ID: Shane Allison, male    DOB: [birth year], 55 y.o.   MRN: [id number]  Chief Complaint  Patient presents with  . Hypertension  . Follow-up    HPI Patient is in today for BP check.  At last OV, he was he was changed from losartan and amlodipine to the combo pill of olmesartan-amlodipine-HCTZ.  At that time, his BP was 151/93.   He states that he has had some leg cramps since starting the new BP meds  Past Medical History:  Diagnosis Date  . Alcohol use   . Asthma   . Cervical radiculopathy   . Chronic neck pain   . COPD (chronic obstructive pulmonary disease) ([place])   . GERD (gastroesophageal reflux disease)   . Hypertension   . Joint ache    Right AC joint separation  . Pneumonia   . Psoriasis   . Shortness of breath dyspnea     Past Surgical History:  Procedure Laterality Date  . ACROMIO-CLAVICULAR JOINT REPAIR Right 10/26/2017   Procedure: RIGHT ACROMIO-CLAVICULAR JOINT RECONSTRUCTION;  Surgeon: Meredith Pel, MD;  Location: [facility];  Service: Orthopedics;  Laterality: Right;  . BIOPSY  05/12/2015   Procedure: BIOPSY;  Surgeon: Danie Binder, MD;  [facility];  Service: Endoscopy;;  gastric biopsy  . BIOPSY  03/15/2016   Procedure: BIOPSY;  Surgeon: Danie Binder, MD;  [facility];  Service: Endoscopy;;  random colon bx's  . COLONOSCOPY WITH PROPOFOL N/A 03/15/2016   Procedure: COLONOSCOPY WITH PROPOFOL;  Surgeon: Danie Binder, MD;  [facility];  Service: Endoscopy;  Laterality: N/A;  12:30 PM  . ESOPHAGOGASTRODUODENOSCOPY (EGD) WITH PROPOFOL N/A 05/12/2015   Dr. Oneida Alar: normal esophagus, gastritis, bleeding friable duodenal mucosa  . HAND TENDON SURGERY Right   . MULTIPLE TOOTH EXTRACTIONS    . POLYPECTOMY  03/15/2016   Procedure: POLYPECTOMY;  Surgeon: Danie Binder, MD;  [facility];  Service: Endoscopy;;  rectal polyp  . THUMB AMPUTATION     Partial left thumb      Family History  Problem Relation Age of Onset  . COPD Mother   . Colon cancer Neg Hx     Social History   Socioeconomic History  . Marital status: Single    Spouse name: Not on file  . Number of children: Not on file  . Years of education: Not on file  . Highest education level: Not on file  Occupational History  . Not on file  Tobacco Use  . Smoking status: Current Every Day Smoker    Packs/day: 2.00    Years: 40.00    Pack years: 80.00    Types: Cigarettes  . Smokeless tobacco: Never Used  Vaping Use  . Vaping Use: Never used  Substance and Sexual Activity  . Alcohol use: Yes    Comment: PATIENT REPORT 6 PACK OF BEER a day  . Drug use: No  . Sexual activity: Not Currently    Birth control/protection: None  Other Topics Concern  . Not on file  Social History Narrative  . Not on file   Social Determinants of Health   Financial Resource Strain: Not on file  Food Insecurity: Not on file  Transportation Needs: Not on file  Physical Activity: Not on file  Stress: Not on file  Social Connections: Not on file  Intimate Partner Violence: Not on file    Outpatient  Medications Prior to Visit  Medication Sig Dispense Refill  . meloxicam (MOBIC) 15 MG tablet Take 1 tablet (15 mg total) by mouth daily. 30 tablet 2  . Olmesartan-amLODIPine-HCTZ 40-5-12.5 MG TABS Take 1 tablet by mouth daily. 90 tablet 1  . TALTZ 80 MG/ML SOAJ Inject 80 mg into the skin every 30 (thirty) days.   9  . Tiotropium Bromide-Olodaterol (STIOLTO RESPIMAT) 2.5-2.5 MCG/ACT AERS Inhale 2 puffs into the lungs daily. 1 each 3  . albuterol (VENTOLIN HFA) 108 (90 Base) MCG/ACT inhaler Inhale 2 puffs into the lungs every 6 (six) hours as needed for wheezing or shortness of breath. 18 g 2   Facility-Administered Medications Prior to Visit  Medication Dose Route Frequency Provider Last Rate Last Admin  . ondansetron (ZOFRAN) 4 mg in sodium chloride 0.9 % 50 mL IVPB  4 mg Intravenous Q6H PRN Ashok Pall, MD        Allergies  Allergen Reactions  . Penicillins Rash    Has patient had a PCN reaction causing immediate rash, facial/tongue/throat swelling, SOB or lightheadedness with hypotension: Yes Has patient had a PCN reaction causing severe rash involving mucus membranes or skin necrosis: No Has patient had a PCN reaction that required hospitalization No Has patient had a PCN reaction occurring within the last 10 years: No If all of the above answers are "NO", then may proceed with Cephalosporin use.      Review of Systems  Constitutional: Negative.   Respiratory: Negative.   Cardiovascular: Negative.        Objective:    Physical Exam Constitutional:      Appearance: Normal appearance.  Cardiovascular:     Rate and Rhythm: Normal rate.     Pulses: Normal pulses.     Heart sounds: Normal heart sounds.  Pulmonary:     Effort: Pulmonary effort is normal.     Breath sounds: Normal breath sounds.  Neurological:     Mental Status: He is alert.     BP 130/77   Pulse 90   Temp 98.3 F (36.8 C)   Resp (!) 22   Ht 5\' 7"  (1.702 m)   Wt 143 lb (64.9 kg)   SpO2 98%   BMI 22.40 kg/m  Wt Readings from Last 3 Encounters:  03/02/20 143 lb (64.9 kg)  02/17/20 142 lb (64.4 kg)  02/03/20 143 lb (64.9 kg)    Health Maintenance Due  Topic Date Due  . COVID-19 Vaccine (1) Never done  . HIV Screening  Never done  . TETANUS/TDAP  Never done  . INFLUENZA VACCINE  Never done    There are no preventive care reminders to display for this patient.   No results found for: TSH Lab Results  Component Value Date   WBC 7.8 01/14/2020   HGB 13.3 01/14/2020   HCT 39.2 01/14/2020   MCV 98 (H) 01/14/2020   PLT 453 (H) 01/14/2020   Lab Results  Component Value Date   NA 130 (L) 01/14/2020   K 5.2 01/14/2020   CO2 22 01/14/2020   GLUCOSE 106 (H) 01/14/2020   BUN 12 01/14/2020   CREATININE 0.93 01/14/2020   BILITOT 0.3 01/14/2020   ALKPHOS 85 01/14/2020   AST 16  01/14/2020   ALT 13 01/14/2020   PROT 7.6 01/14/2020   ALBUMIN 4.5 01/14/2020   CALCIUM 9.9 01/14/2020   ANIONGAP 13 10/26/2017   Lab Results  Component Value Date   CHOL 161 01/14/2020   Lab Results  Component Value Date   HDL 67 01/14/2020   Lab Results  Component Value Date   LDLCALC 79 01/14/2020   Lab Results  Component Value Date   TRIG 81 01/14/2020   No results found for: CHOLHDL Lab Results  Component Value Date   HGBA1C 5.3 01/14/2020       Assessment & Plan:   Problem List Items Addressed This Visit      Cardiovascular and Mediastinum   Hypertension, essential    -BP looks great today -he has some cramping after starting amlodipine-olmesartan-HCTZ -will check BMP and magnesium; may have low K or Mg after starting HCTZ      Relevant Orders   Basic Metabolic Panel (BMET)   Magnesium     Respiratory   COPD (chronic obstructive pulmonary disease) ([facility])    -refilled albuterol today -prescribed Stiolto at last OV       Relevant Medications   albuterol (VENTOLIN HFA) 108 (90 Base) MCG/ACT inhaler    Other Visit Diagnoses    Cramps, muscle, general    -  Primary   Relevant Orders   Basic Metabolic Panel (BMET)   Magnesium       Meds ordered this encounter  Medications  . albuterol (VENTOLIN HFA) 108 (90 Base) MCG/ACT inhaler    Sig: Inhale 2 puffs into the lungs every 6 (six) hours as needed for wheezing or shortness of breath.    Dispense:  18 g    Refill:  2     Noreene Larsson, NP

## 2020-03-02 NOTE — Patient Instructions (Signed)
We will check labs today. You potassium or magnesium may be lower after starting hydrochlorothiazide.  We will call with results.  If the cramps continue, please return to the clinic.  If the cramps resolve, we will meet back up in 4 months for a lab follow-up.

## 2020-03-03 NOTE — Progress Notes (Signed)
His sodium and chloride are a little low, and I would bet that is causing his cramps. Try adding a little more table salt to his food.  We should recheck his labs in a week.

## 2020-03-03 NOTE — Addendum Note (Signed)
Addended by: Demetrius Revel on: 03/03/2020 07:44 AM   Modules accepted: Orders

## 2020-03-04 ENCOUNTER — Other Ambulatory Visit: Payer: Self-pay

## 2020-03-04 DIAGNOSIS — E871 Hypo-osmolality and hyponatremia: Secondary | ICD-10-CM

## 2020-03-04 DIAGNOSIS — I10 Essential (primary) hypertension: Secondary | ICD-10-CM

## 2020-03-04 LAB — BASIC METABOLIC PANEL
BUN/Creatinine Ratio: 12 (ref 9–20)
BUN: 11 mg/dL (ref 6–24)
CO2: 20 mmol/L (ref 20–29)
Calcium: 9.7 mg/dL (ref 8.7–10.2)
Chloride: 84 mmol/L — ABNORMAL LOW (ref 96–106)
Creatinine, Ser: 0.89 mg/dL (ref 0.76–1.27)
GFR calc Af Amer: 114 mL/min/{1.73_m2} (ref >59)
GFR calc non Af Amer: 98 mL/min/{1.73_m2} (ref >59)
Glucose: 80 mg/dL (ref 65–99)
Potassium: 4.8 mmol/L (ref 3.5–5.2)
Sodium: 122 mmol/L — ABNORMAL LOW (ref 134–144)

## 2020-03-04 LAB — MAGNESIUM: Magnesium: 1.7 mg/dL (ref 1.6–2.3)

## 2020-03-12 ENCOUNTER — Other Ambulatory Visit: Payer: Self-pay | Admitting: Nurse Practitioner

## 2020-03-12 DIAGNOSIS — I10 Essential (primary) hypertension: Secondary | ICD-10-CM | POA: Diagnosis not present

## 2020-03-12 DIAGNOSIS — E871 Hypo-osmolality and hyponatremia: Secondary | ICD-10-CM | POA: Diagnosis not present

## 2020-03-13 ENCOUNTER — Other Ambulatory Visit: Payer: Self-pay | Admitting: Nurse Practitioner

## 2020-03-13 LAB — BASIC METABOLIC PANEL
BUN/Creatinine Ratio: 8 — ABNORMAL LOW (ref 9–20)
BUN: 6 mg/dL (ref 6–24)
CO2: 21 mmol/L (ref 20–29)
Calcium: 9.3 mg/dL (ref 8.7–10.2)
Chloride: 88 mmol/L — ABNORMAL LOW (ref 96–106)
Creatinine, Ser: 0.79 mg/dL (ref 0.76–1.27)
GFR calc Af Amer: 119 mL/min/{1.73_m2} (ref >59)
GFR calc non Af Amer: 103 mL/min/{1.73_m2} (ref >59)
Glucose: 83 mg/dL (ref 65–99)
Potassium: 4 mmol/L (ref 3.5–5.2)
Sodium: 126 mmol/L — ABNORMAL LOW (ref 134–144)

## 2020-03-13 MED ORDER — AMLODIPINE-OLMESARTAN 5-40 MG PO TABS: 1.0000 | ORAL_TABLET | Freq: Every day | ORAL | 2 refills | Status: DC

## 2020-03-13 NOTE — Progress Notes (Signed)
His sodium and chloride are a little low, but better than his previous labs. I sent in a new BP medication. It is the same medicine that he is currently taking, but it does not have the hydrochlorothiazide, so he should be less likely to lose sodium in his urine.  If he is still having cramping, he can try Coq-10 or L-carnitine over-the-counter supplements.  If cramping is still bad, I should draw labs in the office again.

## 2020-05-07 DIAGNOSIS — Z5181 Encounter for therapeutic drug level monitoring: Secondary | ICD-10-CM | POA: Diagnosis not present

## 2020-05-07 DIAGNOSIS — L4050 Arthropathic psoriasis, unspecified: Secondary | ICD-10-CM | POA: Diagnosis not present

## 2020-05-07 DIAGNOSIS — L409 Psoriasis, unspecified: Secondary | ICD-10-CM | POA: Diagnosis not present

## 2020-06-08 ENCOUNTER — Other Ambulatory Visit: Payer: Self-pay | Admitting: Nurse Practitioner

## 2020-06-09 ENCOUNTER — Other Ambulatory Visit: Payer: Self-pay | Admitting: Nurse Practitioner

## 2020-06-18 ENCOUNTER — Other Ambulatory Visit: Payer: Self-pay | Admitting: Nurse Practitioner

## 2020-06-18 DIAGNOSIS — I10 Essential (primary) hypertension: Secondary | ICD-10-CM

## 2020-06-18 MED ORDER — AMLODIPINE-OLMESARTAN 5-40 MG PO TABS: 1.0000 | ORAL_TABLET | Freq: Every day | ORAL | 1 refills | Status: DC

## 2020-06-30 ENCOUNTER — Other Ambulatory Visit: Payer: Self-pay

## 2020-06-30 ENCOUNTER — Encounter: Payer: Self-pay | Admitting: Nurse Practitioner

## 2020-06-30 ENCOUNTER — Ambulatory Visit: Payer: Medicaid Other | Admitting: Nurse Practitioner

## 2020-06-30 VITALS — BP 136/89 | HR 91 | Temp 98.3°F | Resp 18 | Ht 67.0 in | Wt 140.0 lb

## 2020-06-30 DIAGNOSIS — Z79899 Other long term (current) drug therapy: Secondary | ICD-10-CM | POA: Diagnosis not present

## 2020-06-30 DIAGNOSIS — I10 Essential (primary) hypertension: Secondary | ICD-10-CM | POA: Diagnosis not present

## 2020-06-30 DIAGNOSIS — L409 Psoriasis, unspecified: Secondary | ICD-10-CM | POA: Diagnosis not present

## 2020-06-30 NOTE — Progress Notes (Signed)
Established Patient Office Visit  Subjective:  Patient ID: Shane Allison, male    DOB: 04-Jun-1965  Age: 55 y.o. MRN: [id number]  CC:  Chief Complaint  Patient presents with  . Hypertension    HPI Shane Allison presents for lab follow-up. He didn't have fasting labs drawn prior to this appt.  He has hx of HTN and takes amlodipine-olmesartan for elevated BP. No adverse medication effects. We stopped HCTZ d/t cramping at his last OV.  He states he is on Taltz and was supposed to have TB testing. He went to [facility] to see derm, and they told him he couldn't get testing because it would have to be read over the weekend. He went back on Monday, and he states they didn't test him because he didn't have an appointment.  Past Medical History:  Diagnosis Date  . Abnormal result of iron profile testing 03/03/2016  . AKI (acute kidney injury) ([place]) 05/10/2015  . Alcohol use   . Asthma   . Cervical radiculopathy   . Cervical spondylosis without myelopathy 05/14/2013  . Chronic neck pain   . COPD (chronic obstructive pulmonary disease) ([place])   . GERD (gastroesophageal reflux disease)   . GI bleed 05/10/2015  . Hypertension   . Joint ache    Right AC joint separation  . Numbness and tingling in hands 05/14/2013  . Partial tear of rotator cuff 04/02/2013  . Pneumonia   . Psoriasis   . Radicular pain in right arm 05/14/2013  . Shortness of breath dyspnea     Past Surgical History:  Procedure Laterality Date  . ACROMIO-CLAVICULAR JOINT REPAIR Right 10/26/2017   Procedure: RIGHT ACROMIO-CLAVICULAR JOINT RECONSTRUCTION;  Surgeon: Meredith Pel, MD;  Location: [facility];  Service: Orthopedics;  Laterality: Right;  . BIOPSY  05/12/2015   Procedure: BIOPSY;  Surgeon: Danie Binder, MD;  [facility];  Service: Endoscopy;;  gastric biopsy  . BIOPSY  03/15/2016   Procedure: BIOPSY;  Surgeon: Danie Binder, MD;  [facility];  Service: Endoscopy;;  random colon bx's  .  COLONOSCOPY WITH PROPOFOL N/A 03/15/2016   Procedure: COLONOSCOPY WITH PROPOFOL;  Surgeon: Danie Binder, MD;  [facility];  Service: Endoscopy;  Laterality: N/A;  12:30 PM  . ESOPHAGOGASTRODUODENOSCOPY (EGD) WITH PROPOFOL N/A 05/12/2015   Dr. Oneida Alar: normal esophagus, gastritis, bleeding friable duodenal mucosa  . HAND TENDON SURGERY Right   . MULTIPLE TOOTH EXTRACTIONS    . POLYPECTOMY  03/15/2016   Procedure: POLYPECTOMY;  Surgeon: Danie Binder, MD;  [facility];  Service: Endoscopy;;  rectal polyp  . THUMB AMPUTATION     Partial left thumb     Family History  Problem Relation Age of Onset  . COPD Mother   . Colon cancer Neg Hx     Social History   Socioeconomic History  . Marital status: Single    Spouse name: Not on file  . Number of children: Not on file  . Years of education: Not on file  . Highest education level: Not on file  Occupational History  . Not on file  Tobacco Use  . Smoking status: Current Every Day Smoker    Packs/day: 2.00    Years: 40.00    Pack years: 80.00    Types: Cigarettes  . Smokeless tobacco: Never Used  Vaping Use  . Vaping Use: Never used  Substance and Sexual Activity  . Alcohol use: Yes  Comment: PATIENT REPORT 6 PACK OF BEER a day  . Drug use: No  . Sexual activity: Not Currently    Birth control/protection: None  Other Topics Concern  . Not on file  Social History Narrative  . Not on file   Social Determinants of Health   Financial Resource Strain: Not on file  Food Insecurity: Not on file  Transportation Needs: Not on file  Physical Activity: Not on file  Stress: Not on file  Social Connections: Not on file  Intimate Partner Violence: Not on file    Outpatient Medications Prior to Visit  Medication Sig Dispense Refill  . amLODipine-olmesartan (AZOR) 5-40 MG tablet Take 1 tablet by mouth daily. 90 tablet 1  . meloxicam (MOBIC) 15 MG tablet Take 1 tablet (15 mg total) by mouth daily. 30 tablet  2  . PROAIR HFA 108 (90 Base) MCG/ACT inhaler INHALE 2 PUFFS INTO THE LUNGS EVERY 6 (SIX) HOURS AS NEEDED FOR WHEEZING OR SHORTNESS OF BREATH 8.5 g 0  . STIOLTO RESPIMAT 2.5-2.5 MCG/ACT AERS INHALE 2 PUFFS INTO THE LUNGS DAILY 4 g 3  . TALTZ 80 MG/ML SOAJ Inject 80 mg into the skin every 30 (thirty) days.   9   Facility-Administered Medications Prior to Visit  Medication Dose Route Frequency Provider Last Rate Last Admin  . ondansetron (ZOFRAN) 4 mg in sodium chloride 0.9 % 50 mL IVPB  4 mg Intravenous Q6H PRN Ashok Pall, MD        Allergies  Allergen Reactions  . Penicillins Rash    Has patient had a PCN reaction causing immediate rash, facial/tongue/throat swelling, SOB or lightheadedness with hypotension: Yes Has patient had a PCN reaction causing severe rash involving mucus membranes or skin necrosis: No Has patient had a PCN reaction that required hospitalization No Has patient had a PCN reaction occurring within the last 10 years: No If all of the above answers are "NO", then may proceed with Cephalosporin use.      ROS Review of Systems  Constitutional: Negative.   Respiratory: Negative.   Cardiovascular: Negative.   Musculoskeletal: Positive for neck pain.  Psychiatric/Behavioral: Negative.       Objective:    Physical Exam Constitutional:      Appearance: Normal appearance.  Cardiovascular:     Rate and Rhythm: Normal rate and regular rhythm.     Pulses: Normal pulses.     Heart sounds: Normal heart sounds.  Pulmonary:     Effort: Pulmonary effort is normal.     Breath sounds: Rhonchi present.  Neurological:     Mental Status: He is alert.  Psychiatric:        Mood and Affect: Mood normal.        Behavior: Behavior normal.        Thought Content: Thought content normal.        Judgment: Judgment normal.     BP 136/89   Pulse 91   Temp 98.3 F (36.8 C)   Resp 18   Ht '5\' 7"'  (1.702 m)   Wt 140 lb (63.5 kg)   SpO2 94%   BMI 21.93 kg/m  Wt  Readings from Last 3 Encounters:  06/30/20 140 lb (63.5 kg)  03/02/20 143 lb (64.9 kg)  02/17/20 142 lb (64.4 kg)     Health Maintenance Due  Topic Date Due  . HIV Screening  Never done  . TETANUS/TDAP  Never done    There are no preventive care reminders to display for  this patient.  No results found for: TSH Lab Results  Component Value Date   WBC 7.8 01/14/2020   HGB 13.3 01/14/2020   HCT 39.2 01/14/2020   MCV 98 (H) 01/14/2020   PLT 453 (H) 01/14/2020   Lab Results  Component Value Date   NA 126 (L) 03/12/2020   K 4.0 03/12/2020   CO2 21 03/12/2020   GLUCOSE 83 03/12/2020   BUN 6 03/12/2020   CREATININE 0.79 03/12/2020   BILITOT 0.3 01/14/2020   ALKPHOS 85 01/14/2020   AST 16 01/14/2020   ALT 13 01/14/2020   PROT 7.6 01/14/2020   ALBUMIN 4.5 01/14/2020   CALCIUM 9.3 03/12/2020   ANIONGAP 13 10/26/2017   Lab Results  Component Value Date   CHOL 161 01/14/2020   Lab Results  Component Value Date   HDL 67 01/14/2020   Lab Results  Component Value Date   LDLCALC 79 01/14/2020   Lab Results  Component Value Date   TRIG 81 01/14/2020   No results found for: CHOLHDL Lab Results  Component Value Date   HGBA1C 5.3 01/14/2020      Assessment & Plan:   Problem List Items Addressed This Visit      Cardiovascular and Mediastinum   Hypertension, essential    BP Readings from Last 3 Encounters:  03/02/20 130/77  02/17/20 (!) 151/93  02/03/20 (!) 186/116  -Continue taking amlodipine-olmesartan -we stopped HCTZ d/t cramping -BP well controlled today at 136/89       Relevant Orders   CBC with Differential/Platelet   CMP14+EGFR   Lipid Panel With LDL/HDL Ratio     Musculoskeletal and Integument   Psoriasis    -followed by Dr. Lyman Speller with [facility] -needs TB testing for Taltz; will save him trip to [place] and get TB testing here -PPD administered today; to be read in 48-72 hrs      Relevant Orders   PPD    Other Visit Diagnoses    High  risk medication use    -  Primary   Relevant Orders   PPD      No orders of the defined types were placed in this encounter.   Follow-up: Return in about 7 months (around 01/20/2021) for Physical Exam (same-day fasting labs).    Noreene Larsson, NP

## 2020-06-30 NOTE — Assessment & Plan Note (Addendum)
BP Readings from Last 3 Encounters:  03/02/20 130/77  02/17/20 (!) 151/93  02/03/20 (!) 186/116  -Continue taking amlodipine-olmesartan -we stopped HCTZ d/t cramping -BP well controlled today at 136/89

## 2020-06-30 NOTE — Assessment & Plan Note (Addendum)
-  followed by Dr. Lyman Speller with [facility] -needs TB testing for Donnetta Hail; will save him trip to [facility] and get TB testing here -PPD administered today; to be read in 48-72 hrs

## 2020-06-30 NOTE — Patient Instructions (Addendum)
Please return in 48-72 hours to have TB skin test read. We close at noon on Friday, so come Thursday afternoon or Friday morning.  For the physical in November, please show up to the appointment fasting, and we will draw labs at that time.

## 2020-07-02 DIAGNOSIS — I10 Essential (primary) hypertension: Secondary | ICD-10-CM | POA: Diagnosis not present

## 2020-07-02 DIAGNOSIS — R5383 Other fatigue: Secondary | ICD-10-CM | POA: Diagnosis not present

## 2020-07-03 LAB — CBC WITH DIFFERENTIAL/PLATELET
Basophils Absolute: 0.1 10*3/uL (ref 0.0–0.2)
Basos: 2 %
EOS (ABSOLUTE): 0.2 10*3/uL (ref 0.0–0.4)
Eos: 3 %
Hematocrit: 37.2 % — ABNORMAL LOW (ref 37.5–51.0)
Hemoglobin: 12.8 g/dL — ABNORMAL LOW (ref 13.0–17.7)
Immature Grans (Abs): 0 10*3/uL (ref 0.0–0.1)
Immature Granulocytes: 0 %
Lymphocytes Absolute: 1.8 10*3/uL (ref 0.7–3.1)
Lymphs: 38 %
MCH: 33.1 pg — ABNORMAL HIGH (ref 26.6–33.0)
MCHC: 34.4 g/dL (ref 31.5–35.7)
MCV: 96 fL (ref 79–97)
Monocytes Absolute: 0.6 10*3/uL (ref 0.1–0.9)
Monocytes: 13 %
Neutrophils Absolute: 2.1 10*3/uL (ref 1.4–7.0)
Neutrophils: 44 %
Platelets: 465 10*3/uL — ABNORMAL HIGH (ref 150–450)
RBC: 3.87 x10E6/uL — ABNORMAL LOW (ref 4.14–5.80)
RDW: 13 % (ref 11.6–15.4)
WBC: 4.8 10*3/uL (ref 3.4–10.8)

## 2020-07-03 LAB — LIPID PANEL WITH LDL/HDL RATIO
Cholesterol, Total: 167 mg/dL (ref 100–199)
HDL: 75 mg/dL (ref >39)
LDL Chol Calc (NIH): 79 mg/dL (ref 0–99)
LDL/HDL Ratio: 1.1 ratio (ref 0.0–3.6)
Triglycerides: 66 mg/dL (ref 0–149)
VLDL Cholesterol Cal: 13 mg/dL (ref 5–40)

## 2020-07-03 LAB — CMP14+EGFR
ALT: 13 IU/L (ref 0–44)
AST: 20 IU/L (ref 0–40)
Albumin/Globulin Ratio: 1.3 (ref 1.2–2.2)
Albumin: 4.4 g/dL (ref 3.8–4.9)
Alkaline Phosphatase: 74 IU/L (ref 44–121)
BUN/Creatinine Ratio: 9 (ref 9–20)
BUN: 8 mg/dL (ref 6–24)
Bilirubin Total: 0.3 mg/dL (ref 0.0–1.2)
CO2: 21 mmol/L (ref 20–29)
Calcium: 9.7 mg/dL (ref 8.7–10.2)
Chloride: 90 mmol/L — ABNORMAL LOW (ref 96–106)
Creatinine, Ser: 0.91 mg/dL (ref 0.76–1.27)
Globulin, Total: 3.4 g/dL (ref 1.5–4.5)
Glucose: 94 mg/dL (ref 65–99)
Potassium: 5 mmol/L (ref 3.5–5.2)
Sodium: 130 mmol/L — ABNORMAL LOW (ref 134–144)
Total Protein: 7.8 g/dL (ref 6.0–8.5)
eGFR: 101 mL/min/{1.73_m2} (ref >59)

## 2020-07-03 NOTE — Progress Notes (Signed)
I don't see anything in the labs that would cause that kind of fatigue. Anemia can cause fatigue, but his is basically insignificant. His hemoglobin is only 2 tenths of a point low. If he was 2.5-4 points low, that would be a different discussion. It isn't likely to be from iron deficiency based on his numbers.  I'll add a TSH to his labs to check his thyroid function.

## 2020-07-03 NOTE — Progress Notes (Signed)
Sodium and chloride are still a little low, but they are trending up (better than last time).  He has mild anemia, but it stable since his last visit. Does he have any signs of bleeding or blood in his stool?

## 2020-07-07 LAB — TSH: TSH: 1.18 u[IU]/mL (ref 0.450–4.500)

## 2020-07-07 LAB — SPECIMEN STATUS REPORT

## 2020-08-10 ENCOUNTER — Other Ambulatory Visit: Payer: Self-pay | Admitting: Nurse Practitioner

## 2020-09-11 ENCOUNTER — Other Ambulatory Visit: Payer: Self-pay | Admitting: Nurse Practitioner

## 2020-10-16 ENCOUNTER — Other Ambulatory Visit: Payer: Self-pay | Admitting: Nurse Practitioner

## 2020-12-03 ENCOUNTER — Other Ambulatory Visit: Payer: Self-pay | Admitting: Nurse Practitioner

## 2021-01-04 ENCOUNTER — Telehealth: Payer: Self-pay | Admitting: Nurse Practitioner

## 2021-01-04 NOTE — Telephone Encounter (Signed)
Pt Stopped by wants to know if the PCP can order the following blood test   Hepatitis Panel  And  Quantiferon-TB Gold Plus in TUBE

## 2021-01-05 NOTE — Telephone Encounter (Signed)
Has physical appt in Dec with his PCP- was told to come fasting that day and labs would be drawn that day. He can ask for those labs to be done as well at his appt

## 2021-01-11 ENCOUNTER — Telehealth: Payer: Self-pay | Admitting: Nurse Practitioner

## 2021-01-11 NOTE — Telephone Encounter (Signed)
Pt called in in regard to blood work  ( view last tele message )   Pt needs blood work done before upcoming appt , So that he can have his sorosis shot .  Pt cant have shot until blood work has been reviewed / received  by Dr Lyman Speller

## 2021-01-12 NOTE — Telephone Encounter (Signed)
Spoke with pt he will get dr Lyman Speller to fax orders to our office.

## 2021-01-22 ENCOUNTER — Other Ambulatory Visit: Payer: Self-pay | Admitting: Nurse Practitioner

## 2021-01-27 NOTE — Telephone Encounter (Signed)
Called pt multiple times with ni answer and we never got a fax from Dr Lyman Speller.

## 2021-01-28 ENCOUNTER — Encounter: Payer: Self-pay | Admitting: Nurse Practitioner

## 2021-01-28 ENCOUNTER — Ambulatory Visit (INDEPENDENT_AMBULATORY_CARE_PROVIDER_SITE_OTHER): Payer: Medicaid Other | Admitting: Nurse Practitioner

## 2021-01-28 ENCOUNTER — Other Ambulatory Visit: Payer: Self-pay

## 2021-01-28 VITALS — BP 156/97 | Temp 98.5°F | Resp 16 | Ht 67.0 in | Wt 143.8 lb

## 2021-01-28 DIAGNOSIS — E049 Nontoxic goiter, unspecified: Secondary | ICD-10-CM | POA: Insufficient documentation

## 2021-01-28 DIAGNOSIS — I10 Essential (primary) hypertension: Secondary | ICD-10-CM

## 2021-01-28 DIAGNOSIS — Z23 Encounter for immunization: Secondary | ICD-10-CM | POA: Diagnosis not present

## 2021-01-28 DIAGNOSIS — Z0001 Encounter for general adult medical examination with abnormal findings: Secondary | ICD-10-CM

## 2021-01-28 DIAGNOSIS — M545 Low back pain, unspecified: Secondary | ICD-10-CM | POA: Diagnosis not present

## 2021-01-28 DIAGNOSIS — D649 Anemia, unspecified: Secondary | ICD-10-CM | POA: Diagnosis not present

## 2021-01-28 DIAGNOSIS — E871 Hypo-osmolality and hyponatremia: Secondary | ICD-10-CM

## 2021-01-28 DIAGNOSIS — R200 Anesthesia of skin: Secondary | ICD-10-CM | POA: Diagnosis not present

## 2021-01-28 MED ORDER — TRAMADOL HCL 50 MG PO TABS: 50.0000 mg | ORAL_TABLET | Freq: Three times a day (TID) | ORAL | 0 refills | Status: AC | PRN

## 2021-01-28 NOTE — Assessment & Plan Note (Signed)
-   check labs today 

## 2021-01-28 NOTE — Assessment & Plan Note (Signed)
-  will check thyroid labs as well as get thyroid U/S

## 2021-01-28 NOTE — Patient Instructions (Signed)
Please have fasting labs drawn today. We will give you a pneumonia shot today as well.  Your BP is elevated today, so we will see you back in about a month to recheck that as well as check on the progress with the neurosurgeon.  I sent in a short course of tramadol to try to decrease your reliance on tylenol. Neurosurgery may try other medication.

## 2021-01-28 NOTE — Assessment & Plan Note (Signed)
-  to bilateral arms and hands; worse at night -has hx of neck surgery and radiculopathy -referral to neurosurgery

## 2021-01-28 NOTE — Assessment & Plan Note (Signed)
-  check iron panel today

## 2021-01-28 NOTE — Assessment & Plan Note (Addendum)
-  referral to neurosurgery -Rx. Tramadol -took 4000 mg of tylenol this AM d/t pain and this is cutting back for him; will try short course tramadol to decrease risk of acetaminophen overdose

## 2021-01-28 NOTE — Addendum Note (Signed)
Addended by: Eual Fines on: 01/28/2021 09:54 AM   Modules accepted: Orders

## 2021-01-28 NOTE — Progress Notes (Signed)
Established Patient Office Visit  Subjective:  Patient ID: Shane Allison, male    DOB: 07-Apr-1965  Age: 55 y.o. MRN: [id number]  CC:  Chief Complaint  Patient presents with   Annual Exam    HPI Shane Allison presents for physical exam.  He is having low back pain, and states that he occasionally does things that he shouldn't like lifting a 500-lb wood heater with only 1 other person. He states he took 4000 mg of tylenol this AM and that is a decrease from his previous intake. He states he has pain when he is sitting and the pain gets worse at night.  He states that his arms and fingers go numb at night, but his left takes longer to recover when he wakes up.  Past Medical History:  Diagnosis Date   Abnormal result of iron profile testing 03/03/2016   AKI (acute kidney injury) ([place]) 05/10/2015   Alcohol use    Asthma    Cervical radiculopathy    Cervical spondylosis without myelopathy 05/14/2013   Chronic neck pain    COPD (chronic obstructive pulmonary disease) ([facility])    GERD (gastroesophageal reflux disease)    GI bleed 05/10/2015   Hypertension    Joint ache    Right AC joint separation   Numbness and tingling in hands 05/14/2013   Partial tear of rotator cuff 04/02/2013   Pneumonia    Psoriasis    Radicular pain in right arm 05/14/2013   Shortness of breath dyspnea     Past Surgical History:  Procedure Laterality Date   ACROMIO-CLAVICULAR JOINT REPAIR Right 10/26/2017   Procedure: RIGHT ACROMIO-CLAVICULAR JOINT RECONSTRUCTION;  Surgeon: Meredith Pel, MD;  Location: [place];  Service: Orthopedics;  Laterality: Right;   BIOPSY  05/12/2015   Procedure: BIOPSY;  Surgeon: Danie Binder, MD;  [facility];  Service: Endoscopy;;  gastric biopsy   BIOPSY  03/15/2016   Procedure: BIOPSY;  Surgeon: Danie Binder, MD;  [facility];  Service: Endoscopy;;  random colon bx's   COLONOSCOPY WITH PROPOFOL N/A 03/15/2016   Procedure: COLONOSCOPY WITH  PROPOFOL;  Surgeon: Danie Binder, MD;  [facility];  Service: Endoscopy;  Laterality: N/A;  12:30 PM   ESOPHAGOGASTRODUODENOSCOPY (EGD) WITH PROPOFOL N/A 05/12/2015   Dr. Oneida Alar: normal esophagus, gastritis, bleeding friable duodenal mucosa   HAND TENDON SURGERY Right    MULTIPLE TOOTH EXTRACTIONS     POLYPECTOMY  03/15/2016   Procedure: POLYPECTOMY;  Surgeon: Danie Binder, MD;  [facility];  Service: Endoscopy;;  rectal polyp   THUMB AMPUTATION     Partial left thumb     Family History  Problem Relation Age of Onset   COPD Mother    Colon cancer Neg Hx     Social History   Socioeconomic History   Marital status: Single    Spouse name: Not on file   Number of children: Not on file   Years of education: Not on file   Highest education level: Not on file  Occupational History   Not on file  Tobacco Use   Smoking status: Every Day    Packs/day: 2.00    Years: 40.00    Pack years: 80.00    Types: Cigarettes   Smokeless tobacco: Never  Vaping Use   Vaping Use: Never used  Substance and Sexual Activity   Alcohol use: Yes    Comment: PATIENT REPORT 6 PACK OF BEER  a day   Drug use: No   Sexual activity: Not Currently    Birth control/protection: None  Other Topics Concern   Not on file  Social History Narrative   Not on file   Social Determinants of Health   Financial Resource Strain: Not on file  Food Insecurity: Not on file  Transportation Needs: Not on file  Physical Activity: Not on file  Stress: Not on file  Social Connections: Not on file  Intimate Partner Violence: Not on file    Outpatient Medications Prior to Visit  Medication Sig Dispense Refill   acetaminophen (TYLENOL) 500 MG tablet Take 500 mg by mouth every 6 (six) hours as needed. Takes as needed     amLODipine-olmesartan (AZOR) 5-40 MG tablet Take 1 tablet by mouth daily. 90 tablet 1   PROAIR HFA 108 (90 Base) MCG/ACT inhaler INHALE 2 PUFFS INTO THE LUNGS EVERY 6 (SIX)  HOURS AS NEEDED FOR WHEEZING OR SHORTNESS OF BREATH 8.5 g 0   STIOLTO RESPIMAT 2.5-2.5 MCG/ACT AERS INHALE 2 PUFFS INTO THE LUNGS DAILY 4 g 3   TALTZ 80 MG/ML SOAJ Inject 80 mg into the skin every 30 (thirty) days.   9   meloxicam (MOBIC) 15 MG tablet Take 1 tablet (15 mg total) by mouth daily. (Patient not taking: Reported on 01/28/2021) 30 tablet 2   Facility-Administered Medications Prior to Visit  Medication Dose Route Frequency Provider Last Rate Last Admin   ondansetron (ZOFRAN) 4 mg in sodium chloride 0.9 % 50 mL IVPB  4 mg Intravenous Q6H PRN Ashok Pall, MD        Allergies  Allergen Reactions   Penicillins Rash    Has patient had a PCN reaction causing immediate rash, facial/tongue/throat swelling, SOB or lightheadedness with hypotension: Yes Has patient had a PCN reaction causing severe rash involving mucus membranes or skin necrosis: No Has patient had a PCN reaction that required hospitalization No Has patient had a PCN reaction occurring within the last 10 years: No If all of the above answers are "NO", then may proceed with Cephalosporin use.      ROS Review of Systems  Constitutional: Negative.   HENT:         Enlarged neck glands  Eyes: Negative.   Respiratory: Negative.    Cardiovascular: Negative.   Gastrointestinal: Negative.   Endocrine: Negative.   Genitourinary: Negative.   Musculoskeletal:  Positive for back pain and neck pain.  Skin: Negative.   Allergic/Immunologic: Negative.   Neurological:  Positive for numbness.       To both hands/arms; worse at night  Hematological: Negative.   Psychiatric/Behavioral: Negative.       Objective:    Physical Exam Constitutional:      Appearance: Normal appearance.  HENT:     Head: Normocephalic and atraumatic.     Right Ear: Tympanic membrane, ear canal and external ear normal.     Left Ear: Tympanic membrane, ear canal and external ear normal.     Nose: Nose normal.     Mouth/Throat:     Mouth:  Mucous membranes are moist.     Pharynx: Oropharynx is clear.  Eyes:     Extraocular Movements: Extraocular movements intact.     Conjunctiva/sclera: Conjunctivae normal.     Pupils: Pupils are equal, round, and reactive to light.  Neck:     Comments: Enlarged glands; symmetrical but large; submandibular salivary gland vs enlarged thyroid; no nodules palpated Cardiovascular:     Rate  and Rhythm: Normal rate and regular rhythm.     Pulses: Normal pulses.     Heart sounds: Normal heart sounds.  Pulmonary:     Effort: Pulmonary effort is normal.     Breath sounds: Normal breath sounds.  Abdominal:     General: Abdomen is flat. Bowel sounds are normal.     Palpations: Abdomen is soft.  Musculoskeletal:        General: Tenderness present.     Comments: To lower back over the spine and paraspinal muscles  Skin:    General: Skin is warm and dry.     Capillary Refill: Capillary refill takes less than 2 seconds.  Neurological:     General: No focal deficit present.     Mental Status: He is alert and oriented to person, place, and time.     Cranial Nerves: No cranial nerve deficit.     Sensory: No sensory deficit.     Motor: No weakness.     Coordination: Coordination normal.     Gait: Gait normal.  Psychiatric:        Mood and Affect: Mood normal.        Behavior: Behavior normal.        Thought Content: Thought content normal.        Judgment: Judgment normal.    BP (!) 156/97   Temp 98.5 F (36.9 C) (Oral)   Resp 16   Ht '5\' 7"'  (1.702 m)   Wt 143 lb 12.8 oz (65.2 kg)   SpO2 95%   BMI 22.52 kg/m  Wt Readings from Last 3 Encounters:  01/28/21 143 lb 12.8 oz (65.2 kg)  06/30/20 140 lb (63.5 kg)  03/02/20 143 lb (64.9 kg)     Health Maintenance Due  Topic Date Due   Pneumococcal Vaccine 45-41 Years old (1 - PCV) Never done   HIV Screening  Never done   Zoster Vaccines- Shingrix (1 of 2) Never done    There are no preventive care reminders to display for this  patient.  Lab Results  Component Value Date   TSH 1.180 07/02/2020   Lab Results  Component Value Date   WBC 4.8 07/02/2020   HGB 12.8 (L) 07/02/2020   HCT 37.2 (L) 07/02/2020   MCV 96 07/02/2020   PLT 465 (H) 07/02/2020   Lab Results  Component Value Date   NA 130 (L) 07/02/2020   K 5.0 07/02/2020   CO2 21 07/02/2020   GLUCOSE 94 07/02/2020   BUN 8 07/02/2020   CREATININE 0.91 07/02/2020   BILITOT 0.3 07/02/2020   ALKPHOS 74 07/02/2020   AST 20 07/02/2020   ALT 13 07/02/2020   PROT 7.8 07/02/2020   ALBUMIN 4.4 07/02/2020   CALCIUM 9.7 07/02/2020   ANIONGAP 13 10/26/2017   EGFR 101 07/02/2020   Lab Results  Component Value Date   CHOL 167 07/02/2020   Lab Results  Component Value Date   HDL 75 07/02/2020   Lab Results  Component Value Date   LDLCALC 79 07/02/2020   Lab Results  Component Value Date   TRIG 66 07/02/2020   No results found for: [facility] Lab Results  Component Value Date   HGBA1C 5.3 01/14/2020      Assessment & Plan:   Problem List Items Addressed This Visit       Cardiovascular and Mediastinum   Hypertension, essential    BP Readings from Last 3 Encounters:  01/28/21 (!) 156/97  06/30/20 136/89  03/02/20 130/77  -elevated today; recheck with next set of labs      Relevant Orders   CBC with Differential/Platelet   CMP14+EGFR   Lipid Panel With LDL/HDL Ratio     Endocrine   Enlarged thyroid    -will check thyroid labs as well as get thyroid U/S      Relevant Orders   TSH+T4F+T3Free   US THYROID     Other   Numbness    -to bilateral arms and hands; worse at night -has hx of neck surgery and radiculopathy -referral to neurosurgery      Relevant Orders   Ambulatory referral to Neurosurgery   Hyponatremia    -check labs today      Relevant Orders   CMP14+EGFR   Anemia    -check iron panel today      Relevant Orders   Iron, TIBC and Ferritin Panel   Low back pain of over 3 months duration    -referral to  neurosurgery -Rx. Tramadol -took 4000 mg of tylenol this AM d/t pain and this is cutting back for him; will try short course tramadol to decrease risk of acetaminophen overdose      Relevant Medications   acetaminophen (TYLENOL) 500 MG tablet   traMADol (ULTRAM) 50 MG tablet   Other Relevant Orders   Ambulatory referral to Neurosurgery   Other Visit Diagnoses     Encounter for general adult medical examination with abnormal findings    -  Primary   Relevant Orders   CBC with Differential/Platelet   CMP14+EGFR   Lipid Panel With LDL/HDL Ratio   Iron, TIBC and Ferritin Panel   TSH+T4F+T3Free   HIV Antibody (routine testing w rflx)       Meds ordered this encounter  Medications   traMADol (ULTRAM) 50 MG tablet    Sig: Take 1 tablet (50 mg total) by mouth every 8 (eight) hours as needed for up to 5 days.    Dispense:  15 tablet    Refill:  0     Follow-up: Return in about 1 month (around 02/28/2021) for Follow-up (HTN, pain).    Noreene Larsson, NP

## 2021-01-28 NOTE — Assessment & Plan Note (Signed)
BP Readings from Last 3 Encounters:  01/28/21 (!) 156/97  06/30/20 136/89  03/02/20 130/77   -elevated today; recheck with next set of labs

## 2021-01-29 LAB — CBC WITH DIFFERENTIAL/PLATELET
Basophils Absolute: 0.1 10*3/uL (ref 0.0–0.2)
Basos: 1 %
EOS (ABSOLUTE): 0 10*3/uL (ref 0.0–0.4)
Eos: 1 %
Hematocrit: 36.6 % — ABNORMAL LOW (ref 37.5–51.0)
Hemoglobin: 13 g/dL (ref 13.0–17.7)
Immature Grans (Abs): 0 10*3/uL (ref 0.0–0.1)
Immature Granulocytes: 0 %
Lymphocytes Absolute: 1.4 10*3/uL (ref 0.7–3.1)
Lymphs: 28 %
MCH: 34.8 pg — ABNORMAL HIGH (ref 26.6–33.0)
MCHC: 35.5 g/dL (ref 31.5–35.7)
MCV: 98 fL — ABNORMAL HIGH (ref 79–97)
Monocytes Absolute: 0.5 10*3/uL (ref 0.1–0.9)
Monocytes: 11 %
Neutrophils Absolute: 3 10*3/uL (ref 1.4–7.0)
Neutrophils: 59 %
Platelets: 316 10*3/uL (ref 150–450)
RBC: 3.74 x10E6/uL — ABNORMAL LOW (ref 4.14–5.80)
RDW: 11.8 % (ref 11.6–15.4)
WBC: 5 10*3/uL (ref 3.4–10.8)

## 2021-01-29 LAB — HIV ANTIBODY (ROUTINE TESTING W REFLEX): HIV Screen 4th Generation wRfx: NONREACTIVE

## 2021-01-29 LAB — TSH+T4F+T3FREE
Free T4: 1.29 ng/dL (ref 0.82–1.77)
T3, Free: 2.7 pg/mL (ref 2.0–4.4)
TSH: 0.793 u[IU]/mL (ref 0.450–4.500)

## 2021-01-29 LAB — CMP14+EGFR
ALT: 16 IU/L (ref 0–44)
AST: 26 IU/L (ref 0–40)
Albumin/Globulin Ratio: 2 (ref 1.2–2.2)
Albumin: 4.7 g/dL (ref 3.8–4.9)
Alkaline Phosphatase: 59 IU/L (ref 44–121)
BUN/Creatinine Ratio: 12 (ref 9–20)
BUN: 12 mg/dL (ref 6–24)
Bilirubin Total: 0.5 mg/dL (ref 0.0–1.2)
CO2: 23 mmol/L (ref 20–29)
Calcium: 9.4 mg/dL (ref 8.7–10.2)
Chloride: 95 mmol/L — ABNORMAL LOW (ref 96–106)
Creatinine, Ser: 0.97 mg/dL (ref 0.76–1.27)
Globulin, Total: 2.4 g/dL (ref 1.5–4.5)
Glucose: 116 mg/dL — ABNORMAL HIGH (ref 70–99)
Potassium: 5.5 mmol/L — ABNORMAL HIGH (ref 3.5–5.2)
Sodium: 132 mmol/L — ABNORMAL LOW (ref 134–144)
Total Protein: 7.1 g/dL (ref 6.0–8.5)
eGFR: 93 mL/min/{1.73_m2} (ref >59)

## 2021-01-29 LAB — IRON,TIBC AND FERRITIN PANEL
Ferritin: 136 ng/mL (ref 30–400)
Iron Saturation: 37 % (ref 15–55)
Iron: 112 ug/dL (ref 38–169)
Total Iron Binding Capacity: 302 ug/dL (ref 250–450)
UIBC: 190 ug/dL (ref 111–343)

## 2021-01-29 LAB — LIPID PANEL WITH LDL/HDL RATIO
Cholesterol, Total: 196 mg/dL (ref 100–199)
HDL: 85 mg/dL (ref >39)
LDL Chol Calc (NIH): 94 mg/dL (ref 0–99)
LDL/HDL Ratio: 1.1 ratio (ref 0.0–3.6)
Triglycerides: 98 mg/dL (ref 0–149)
VLDL Cholesterol Cal: 17 mg/dL (ref 5–40)

## 2021-01-29 NOTE — Progress Notes (Signed)
Liver function is looking good. Blood counts are stable, and his iron panel looks great. His potassium is up a little since his last visit, so if he is having cramps, he should return to the clinic and we can recheck his potassium. Otherwise, we will monitor it with routine labs.

## 2021-02-26 ENCOUNTER — Other Ambulatory Visit: Payer: Self-pay | Admitting: Nurse Practitioner

## 2021-03-01 ENCOUNTER — Other Ambulatory Visit: Payer: Self-pay

## 2021-03-01 ENCOUNTER — Encounter: Payer: Self-pay | Admitting: Nurse Practitioner

## 2021-03-01 ENCOUNTER — Ambulatory Visit (INDEPENDENT_AMBULATORY_CARE_PROVIDER_SITE_OTHER): Payer: Medicaid Other | Admitting: Nurse Practitioner

## 2021-03-01 DIAGNOSIS — J069 Acute upper respiratory infection, unspecified: Secondary | ICD-10-CM | POA: Insufficient documentation

## 2021-03-01 DIAGNOSIS — M545 Low back pain, unspecified: Secondary | ICD-10-CM | POA: Diagnosis not present

## 2021-03-01 DIAGNOSIS — T7840XA Allergy, unspecified, initial encounter: Secondary | ICD-10-CM | POA: Diagnosis not present

## 2021-03-01 DIAGNOSIS — I10 Essential (primary) hypertension: Secondary | ICD-10-CM | POA: Diagnosis not present

## 2021-03-01 MED ORDER — BENZONATATE 100 MG PO CAPS: 100.0000 mg | ORAL_CAPSULE | Freq: Two times a day (BID) | ORAL | 0 refills | Status: DC | PRN

## 2021-03-01 MED ORDER — CETIRIZINE HCL 10 MG PO TABS: 10.0000 mg | ORAL_TABLET | Freq: Every day | ORAL | 11 refills | Status: DC

## 2021-03-01 NOTE — Assessment & Plan Note (Signed)
Take cetrizine 10mg  daily.

## 2021-03-01 NOTE — Assessment & Plan Note (Signed)
Pt will com for covid and flu test tomorrow. Take teasslon 100mg  BID prn cough

## 2021-03-01 NOTE — Assessment & Plan Note (Signed)
Continue current me. Avoid salt

## 2021-03-01 NOTE — Assessment & Plan Note (Signed)
Pt told to take tylenol 1000mg  every 6 hours as needed for pain. Pt told to not take more than 2 tylenol(1000mg  ) in 6 hours, risk of tylenol overdose discussed with pt, he verbalized understanding. Neurosurgery referral pending

## 2021-03-01 NOTE — Progress Notes (Signed)
Virtual Visit via Telephone Note  I connected with Shane Allison @ on 03/01/21 at 9:28am  by telephone and verified that I am speaking with the correct person using two identifiers.I spent 9 minutes talking to pt and reviewing his chart.   Location: Patient: home Provider: office   I discussed the limitations, risks, security and privacy concerns of performing an evaluation and management service by telephone and the availability of in person appointments. I also discussed with the patient that there may be a patient responsible charge related to this service. The patient expressed understanding and agreed to proceed.   History of Present Illness: Pt c/o non productive, cough that started 2 days ago. He has been having , itchy throat, sneezing and  running nose. Denies fever, chills, bloody sputum, body aches, wheezing, sob , has not been around anyone sick. He has taken otc tylenol, not taken cough med. . Pt stated that he can not take flu,covid vaccine per dermatologists, he was around a little girl that has a cold. Pt states that he will come for flu and covid test tomorrow, he is not able to come today.   Pt has been taking his bp med but has not been checking BP at home.  Pt states that he is Still having Low back pain 5/10, took 6 tylenol this mooring. He has not seen neurosurgery yet   Observations/Objective:   Assessment and Plan: URI :take tessaolon 100mg  BID PRN, do covid and flu test. Chronic low back pain. Pt told to take tylenol 1000mg  every 6 hours as needed for pain. Pt told to not take more than 2 tylenol(1000mg  ) in 6 hours, risk of tylenol overdose discussed with pt, he verbalized understanding. Neurosurgery referral pending   BP continue Amlodipine-olmersartan 5-40mg . Avoid salt  Follow Up Instructions:  I discussed the assessment and treatment plan with the patient. The patient was provided an opportunity to ask questions and all were answered. The patient agreed  with the plan and demonstrated an understanding of the instructions.   The patient was advised to call back or seek an in-person evaluation if the symptoms worsen or if the condition fails to improve as anticipated.

## 2021-03-02 ENCOUNTER — Other Ambulatory Visit: Payer: Self-pay

## 2021-03-02 ENCOUNTER — Encounter: Payer: Self-pay | Admitting: *Deleted

## 2021-03-02 ENCOUNTER — Ambulatory Visit (INDEPENDENT_AMBULATORY_CARE_PROVIDER_SITE_OTHER): Payer: Medicaid Other | Admitting: *Deleted

## 2021-03-02 DIAGNOSIS — J069 Acute upper respiratory infection, unspecified: Secondary | ICD-10-CM | POA: Diagnosis not present

## 2021-03-02 LAB — POCT INFLUENZA A/B
Influenza A, POC: NEGATIVE
Influenza B, POC: NEGATIVE

## 2021-03-04 ENCOUNTER — Other Ambulatory Visit: Payer: Self-pay | Admitting: Nurse Practitioner

## 2021-03-04 DIAGNOSIS — I10 Essential (primary) hypertension: Secondary | ICD-10-CM

## 2021-03-04 LAB — SARS-COV-2, NAA 2 DAY TAT

## 2021-03-04 LAB — NOVEL CORONAVIRUS, NAA: SARS-CoV-2, NAA: NOT DETECTED

## 2021-03-04 NOTE — Progress Notes (Signed)
Covid test was negative Please schedule a F/U for BP in 3 months, get lab do work done 3-5 days before next visit.  Thanks

## 2021-03-04 NOTE — Progress Notes (Signed)
Called and notified patient of results, he verbalized understanding. 3 month F/U has been scheduled

## 2021-03-17 ENCOUNTER — Other Ambulatory Visit: Payer: Self-pay | Admitting: Nurse Practitioner

## 2021-03-17 DIAGNOSIS — I10 Essential (primary) hypertension: Secondary | ICD-10-CM

## 2021-04-05 ENCOUNTER — Other Ambulatory Visit: Payer: Self-pay | Admitting: Nurse Practitioner

## 2021-05-17 ENCOUNTER — Other Ambulatory Visit: Payer: Self-pay | Admitting: Nurse Practitioner

## 2021-06-02 ENCOUNTER — Ambulatory Visit: Payer: Medicaid Other | Admitting: Nurse Practitioner

## 2021-06-02 ENCOUNTER — Encounter: Payer: Self-pay | Admitting: Nurse Practitioner

## 2021-06-02 VITALS — BP 124/82 | HR 62 | Ht 67.0 in | Wt 141.0 lb

## 2021-06-02 DIAGNOSIS — M25511 Pain in right shoulder: Secondary | ICD-10-CM | POA: Insufficient documentation

## 2021-06-02 DIAGNOSIS — J432 Centrilobular emphysema: Secondary | ICD-10-CM

## 2021-06-02 DIAGNOSIS — Z720 Tobacco use: Secondary | ICD-10-CM

## 2021-06-02 DIAGNOSIS — I10 Essential (primary) hypertension: Secondary | ICD-10-CM

## 2021-06-02 DIAGNOSIS — G8929 Other chronic pain: Secondary | ICD-10-CM

## 2021-06-02 NOTE — Patient Instructions (Addendum)

## 2021-06-02 NOTE — Progress Notes (Signed)
? ?  Shane Allison     MRN: [id number]      DOB: Nov 11, 1965 ? ? ?HPI ?Mr. Schicker with past medical history of COPD, hypertension, GERD, tobacco abuse is here for follow up and re-evaluation of chronic medical conditions,   ? ?The PT denies any adverse reactions to current medications since the last visit.  ? ? ?Pt c/o right shoulder pain , fell down the ramp 3 days ago and landed on his right hand, , had right shoulder surgery 2 years ago. Has aching pain 3/10 takes tylenol $RemoveBefor'500mg'twfFDaideVoH$  PRN. Able to move shoulder, denies numbness, tingling.  ? ?Due for shingles vaccine, Tdap vaccine states that he can not have live vaccines due to getting treatment for psoriasis.  ? ? ? ? ?ROS ?Denies recent fever or chills. ?Denies sinus pressure, nasal congestion, ear pain or sore throat. ?Denies chest congestion, productive cough or wheezing. ?Denies chest pains, palpitations and leg swelling ?Denies abdominal pain, nausea, vomiting,diarrhea or constipation.   ?Denies headaches, seizures, numbness, or tingling. ?Denies depression, anxiety or insomnia. ? ? ? ?PE ? ?BP 124/82 (BP Location: Left Arm, Patient Position: Sitting, Cuff Size: Large)   Pulse 62   Ht $R'5\' 7"'qP$  (1.702 m)   Wt 141 lb (64 kg)   SpO2 (!) 89%   BMI 22.08 kg/m?  ? ?Patient alert and oriented and in no cardiopulmonary distress. ? ?Chest: Clear to auscultation bilaterally. ? ?CVS: S1, S2 no murmurs, no S3.Regular rate. ? ?ABD: Soft non tender.  ? ?Ext: No edema ? ?MS: Adequate ROM spine, shoulders, hips and knees tenderness on palpation of right shoulder.  ? ?Psych: Good eye contact, normal affect. Memory intact not anxious or depressed appearing. ? ?CNS: CN 2-12 intact, power,  normal throughout.no focal deficits noted. ? ?Assessment & Plan ? ?Tobacco abuse ?Has tried to quit chantix made him feel wired. Has tried nicotine patch it did not work. Smokes for about 46 years  ?States that he used to do 2 to 3 packs daily but has cut down to 1 pack daily.  Patient  encouraged to continue to cut back on smoking to reduce risk of lung cancer he verbalized understanding. ?We will order low dose CT scanning at his next annual physical examination ? ?COPD (chronic obstructive pulmonary disease) ([place]) ?Current everyday smoker, smokes 1 pack a day ?Need to quit smoking discussed with patient he verbalized understanding ?On Ventolin inhaler prn, stiolto 2.5-2.29mcg/act aers 2 puffs daily.  Stable condition continue current medications ? ?Right shoulder pain ?Chronic condition worsened by recent fall ?No concerns for fracture ?Continue OTC Tylenol as needed ? ?Hypertension, essential ?BP Readings from Last 3 Encounters:  ?06/02/21 124/82  ?01/28/21 (!) 156/97  ?06/30/20 136/89  ?Chronic condition well-controlled ?Continue amlodipine-olmesartan 5-40-40 mg tablet daily ?DASH diet advised engage in regular exercises warmth 150 minutes weekly ?CMP plus EGFR today  ?

## 2021-06-02 NOTE — Assessment & Plan Note (Addendum)
Has tried to quit chantix made him feel wired. Has tried nicotine patch it did not work. Smokes for about 46 years  ?States that he used to do 2 to 3 packs daily but has cut down to 1 pack daily.  Patient encouraged to continue to cut back on smoking to reduce risk of lung cancer he verbalized understanding. ?We will order low dose CT scanning at his next annual physical examination ?

## 2021-06-02 NOTE — Assessment & Plan Note (Signed)
Chronic condition worsened by recent fall ?No concerns for fracture ?Continue OTC Tylenol as needed ?

## 2021-06-02 NOTE — Assessment & Plan Note (Signed)
BP Readings from Last 3 Encounters:  ?06/02/21 124/82  ?01/28/21 (!) 156/97  ?06/30/20 136/89  ?Chronic condition well-controlled ?Continue amlodipine-olmesartan 5-40-40 mg tablet daily ?DASH diet advised engage in regular exercises warmth 150 minutes weekly ?CMP plus EGFR today ?

## 2021-06-02 NOTE — Assessment & Plan Note (Signed)
Current everyday smoker, smokes 1 pack a day ?Need to quit smoking discussed with patient he verbalized understanding ?On Ventolin inhaler prn, stiolto 2.5-2.82mg/act aers 2 puffs daily.  Stable condition continue current medications ?

## 2021-06-03 ENCOUNTER — Telehealth: Payer: Self-pay | Admitting: Nurse Practitioner

## 2021-06-03 ENCOUNTER — Other Ambulatory Visit: Payer: Self-pay | Admitting: Nurse Practitioner

## 2021-06-03 DIAGNOSIS — D649 Anemia, unspecified: Secondary | ICD-10-CM

## 2021-06-03 DIAGNOSIS — E875 Hyperkalemia: Secondary | ICD-10-CM

## 2021-06-03 LAB — CBC
Hematocrit: 37.4 % — ABNORMAL LOW (ref 37.5–51.0)
Hemoglobin: 13.1 g/dL (ref 13.0–17.7)
MCH: 34 pg — ABNORMAL HIGH (ref 26.6–33.0)
MCHC: 35 g/dL (ref 31.5–35.7)
MCV: 97 fL (ref 79–97)
Platelets: 336 10*3/uL (ref 150–450)
RBC: 3.85 x10E6/uL — ABNORMAL LOW (ref 4.14–5.80)
RDW: 12.9 % (ref 11.6–15.4)
WBC: 5.9 10*3/uL (ref 3.4–10.8)

## 2021-06-03 LAB — CMP14+EGFR
ALT: 14 IU/L (ref 0–44)
AST: 25 IU/L (ref 0–40)
Albumin/Globulin Ratio: 1.6 (ref 1.2–2.2)
Albumin: 4.7 g/dL (ref 3.8–4.9)
Alkaline Phosphatase: 78 IU/L (ref 44–121)
BUN/Creatinine Ratio: 15 (ref 9–20)
BUN: 15 mg/dL (ref 6–24)
Bilirubin Total: 0.3 mg/dL (ref 0.0–1.2)
CO2: 21 mmol/L (ref 20–29)
Calcium: 9.6 mg/dL (ref 8.7–10.2)
Chloride: 97 mmol/L (ref 96–106)
Creatinine, Ser: 1.03 mg/dL (ref 0.76–1.27)
Globulin, Total: 2.9 g/dL (ref 1.5–4.5)
Glucose: 90 mg/dL (ref 70–99)
Potassium: 6.5 mmol/L — ABNORMAL HIGH (ref 3.5–5.2)
Sodium: 133 mmol/L — ABNORMAL LOW (ref 134–144)
Total Protein: 7.6 g/dL (ref 6.0–8.5)
eGFR: 87 mL/min/{1.73_m2} (ref >59)

## 2021-06-03 MED ORDER — LOKELMA 5 G PO PACK: 5.0000 g | PACK | Freq: Once | ORAL | 0 refills | Status: AC

## 2021-06-03 NOTE — Progress Notes (Signed)
Avoid banana, and other foods and supplements containing potassium

## 2021-06-03 NOTE — Progress Notes (Signed)
Potassium level is elevated patient should take Lokelma 5 mg one-time dose, have labs done in a week, patient should avoid potassium supplement and foods containing potassium.  Take ordered medications 2 hours before or after leukemia ? ?I have ordered labs to further evaluate his anemia.

## 2021-06-03 NOTE — Telephone Encounter (Signed)
Spoke with pt's mother.

## 2021-06-03 NOTE — Telephone Encounter (Signed)
Patient returning call for lab results. ? ?Okay to leave results with mother ?

## 2021-06-14 ENCOUNTER — Other Ambulatory Visit: Payer: Self-pay | Admitting: Nurse Practitioner

## 2021-06-15 ENCOUNTER — Other Ambulatory Visit: Payer: Self-pay | Admitting: Nurse Practitioner

## 2021-06-15 DIAGNOSIS — D649 Anemia, unspecified: Secondary | ICD-10-CM | POA: Diagnosis not present

## 2021-06-15 DIAGNOSIS — E875 Hyperkalemia: Secondary | ICD-10-CM | POA: Diagnosis not present

## 2021-06-15 MED ORDER — VENTOLIN HFA 108 (90 BASE) MCG/ACT IN AERS: 2.0000 | INHALATION_SPRAY | Freq: Four times a day (QID) | RESPIRATORY_TRACT | 3 refills | Status: DC | PRN

## 2021-06-16 ENCOUNTER — Other Ambulatory Visit: Payer: Self-pay | Admitting: Nurse Practitioner

## 2021-06-16 DIAGNOSIS — I10 Essential (primary) hypertension: Secondary | ICD-10-CM

## 2021-06-16 LAB — BASIC METABOLIC PANEL
BUN/Creatinine Ratio: 11 (ref 9–20)
BUN: 11 mg/dL (ref 6–24)
CO2: 20 mmol/L (ref 20–29)
Calcium: 9.6 mg/dL (ref 8.7–10.2)
Chloride: 91 mmol/L — ABNORMAL LOW (ref 96–106)
Creatinine, Ser: 1.03 mg/dL (ref 0.76–1.27)
Glucose: 99 mg/dL (ref 70–99)
Potassium: 5.5 mmol/L — ABNORMAL HIGH (ref 3.5–5.2)
Sodium: 127 mmol/L — ABNORMAL LOW (ref 134–144)
eGFR: 87 mL/min/{1.73_m2} (ref >59)

## 2021-06-16 LAB — B12 AND FOLATE PANEL
Folate: 8.4 ng/mL (ref >3.0)
Vitamin B-12: 325 pg/mL (ref 232–1245)

## 2021-06-16 MED ORDER — AMLODIPINE BESYLATE 10 MG PO TABS: 10.0000 mg | ORAL_TABLET | Freq: Every day | ORAL | 1 refills | Status: DC

## 2021-06-16 NOTE — Progress Notes (Signed)
Patient is to stop taking current blood pressure medication due to high potassium level ?I have sent in all prescription for amlodipine 10 mg to be taken  daily, patient should follow-up in 4 weeks to recheck blood pressure. ? ?Sodium level remains low, patient should avoid alcohol and patient should go to the ER if he starts feeling lethargic nauseous, vomiting dizziness confusion or muscle cramps. He should increase intake of salt and reduce the amount of water he is taking. ? ?

## 2021-06-16 NOTE — Progress Notes (Unsigned)
dipine ?

## 2021-07-22 ENCOUNTER — Ambulatory Visit: Payer: Medicaid Other | Admitting: Nurse Practitioner

## 2021-07-23 ENCOUNTER — Emergency Department (HOSPITAL_COMMUNITY): Payer: Medicaid Other

## 2021-07-23 ENCOUNTER — Encounter (HOSPITAL_COMMUNITY): Payer: Self-pay | Admitting: *Deleted

## 2021-07-23 ENCOUNTER — Other Ambulatory Visit: Payer: Self-pay

## 2021-07-23 ENCOUNTER — Emergency Department (HOSPITAL_COMMUNITY)
Admission: EM | Admit: 2021-07-23 | Discharge: 2021-07-23 | Disposition: A | Discharge status: Home / Self Care | Payer: Medicaid Other | Attending: Emergency Medicine | Admitting: Emergency Medicine

## 2021-07-23 DIAGNOSIS — I10 Essential (primary) hypertension: Secondary | ICD-10-CM | POA: Insufficient documentation

## 2021-07-23 DIAGNOSIS — M25572 Pain in left ankle and joints of left foot: Secondary | ICD-10-CM | POA: Insufficient documentation

## 2021-07-23 DIAGNOSIS — W010XXA Fall on same level from slipping, tripping and stumbling without subsequent striking against object, initial encounter: Secondary | ICD-10-CM | POA: Diagnosis not present

## 2021-07-23 DIAGNOSIS — F1090 Alcohol use, unspecified, uncomplicated: Secondary | ICD-10-CM | POA: Diagnosis not present

## 2021-07-23 DIAGNOSIS — Z789 Other specified health status: Secondary | ICD-10-CM

## 2021-07-23 DIAGNOSIS — J449 Chronic obstructive pulmonary disease, unspecified: Secondary | ICD-10-CM | POA: Insufficient documentation

## 2021-07-23 NOTE — ED Notes (Signed)
Applied ice to left ankle

## 2021-07-23 NOTE — Discharge Instructions (Signed)
Left ankle pain-placed in a brace please wear during the day may take off at nighttime.  May use over-the-counter pain medication as needed.  Please apply ice to the area help decrease pain and inflammation.  Follow-up with orthopedics. Alcohol use-giving up patient resources for alcohol use please review  Come back to the emergency department if you develop chest pain, shortness of breath, severe abdominal pain, uncontrolled nausea, vomiting, diarrhea.

## 2021-07-23 NOTE — ED Triage Notes (Signed)
Pt in c/o L ankle pain onset x 2 days ago after tripping over a hose, pt reports swelling and pain and inability to put pressure on the L foot, pt A&O x4

## 2021-07-23 NOTE — ED Provider Notes (Signed)
[facility] EMERGENCY DEPARTMENT Provider Note   CSN: [id number] Arrival date & time: 07/23/21  1024     History  Chief Complaint  Patient presents with   Ankle Pain    Shane Allison is a 56 y.o. male.  HPI  Patient with medical history including hypertension, COPD, GERD, alcohol dependency presents with complaint of a fall.  Patient said he fell on Wednesday, states that he was in his shop and tripped over the air hose causing fall onto his right side.  He states he did strike his head but denies loss of conscious, he states afterwards he was able to get up without difficulty, states later he started have pain in his left ankle, is mainly on the lateral malleolus, does not radiate, states he is ambulatory but has to lean on the medial side, denies any paresthesia or weakness moving down his toes, he denies any pain in his knees hips back neck wrists elbows.  He denies any headache change in vision paresthesia or weakness of lower extremities.  He has no other complaints at this time.  Patient does endorse that he was drinking at that time, he states he typically drinks around twelve to 18 of beer daily, states he is never hospitalized for alcohol withdrawals, denies any suicidal homicidal ideations, he states that he is stopped drinking since Wednesday and does not want to drink again.     Home Medications Prior to Admission medications   Medication Sig Start Date End Date Taking? Authorizing Provider  acetaminophen (TYLENOL) 500 MG tablet Take 500 mg by mouth every 6 (six) hours as needed. Takes as needed    [provider]  amLODipine (NORVASC) 10 MG tablet Take 1 tablet (10 mg total) by mouth daily. 06/16/21   Paseda, Dewaine Conger, FNP  benzonatate (TESSALON) 100 MG capsule Take 1 capsule (100 mg total) by mouth 2 (two) times daily as needed for cough. Patient not taking: Reported on 06/02/2021 03/01/21   Renee Rival, FNP  cetirizine (ZYRTEC) 10 MG tablet Take 1  tablet (10 mg total) by mouth daily. Patient not taking: Reported on 06/02/2021 03/01/21   Paseda, Dewaine Conger, FNP  STIOLTO RESPIMAT 2.5-2.5 MCG/ACT AERS INHALE 2 PUFFS INTO THE LUNGS DAILY 04/05/21   Paseda, Dewaine Conger, FNP  TALTZ 80 MG/ML SOAJ Inject 80 mg into the skin every 30 (thirty) days.  04/03/17   [provider]  VENTOLIN HFA 108 (90 Base) MCG/ACT inhaler Inhale 2 puffs into the lungs every 6 (six) hours as needed for wheezing or shortness of breath. 06/15/21   Paseda, Dewaine Conger, FNP      Allergies    Penicillins    Review of Systems   Review of Systems  Constitutional:  Negative for chills and fever.  Respiratory:  Negative for shortness of breath.   Cardiovascular:  Negative for chest pain.  Gastrointestinal:  Negative for abdominal pain.  Neurological:  Negative for headaches.   Physical Exam Updated Vital Signs Pulse 99   Temp 98.9 F (37.2 C) (Oral)   Resp 18   SpO2 100%  Physical Exam Vitals and nursing note reviewed.  Constitutional:      General: He is not in acute distress.    Appearance: He is not ill-appearing.  HENT:     Head: Normocephalic and atraumatic.     Comments: No deformity the head present no raccoon eyes or battle sign noted.    Nose: No congestion.     Mouth/Throat:  Mouth: Mucous membranes are moist.     Pharynx: Oropharynx is clear.     Comments: No trismus no torticollis, no oral trauma Eyes:     Extraocular Movements: Extraocular movements intact.     Conjunctiva/sclera: Conjunctivae normal.     Pupils: Pupils are equal, round, and reactive to light.  Cardiovascular:     Rate and Rhythm: Normal rate and regular rhythm.     Pulses: Normal pulses.     Heart sounds: No murmur heard.   No friction rub. No gallop.  Pulmonary:     Effort: No respiratory distress.     Breath sounds: No wheezing, rhonchi or rales.  Musculoskeletal:     Comments: Spine was palpated nontender to palpation no step-off deformities noted, no  pelvis instability, no leg shortening patient's upper or lower extremities were palpated they are all nontender to patient.  Patient had focalized tenderness in his left ankle, is on the lateral malleolus, there is no crepitus or deformities noted.  He had 2+ dorsal pedal pulses, full range of motion his toes ankle and knee.  Skin:    General: Skin is warm and dry.  Neurological:     Mental Status: He is alert.  Psychiatric:        Mood and Affect: Mood normal.    ED Results / Procedures / Treatments   Labs (all labs ordered are listed, but only abnormal results are displayed) Labs Reviewed - No data to display  EKG None  Radiology DG Ankle Left Port  Result Date: 07/23/2021 CLINICAL DATA:  Recent fall with left ankle pain and swelling. EXAM: PORTABLE LEFT ANKLE - 2 VIEW COMPARISON:  None Available. FINDINGS: There is no evidence of fracture, dislocation, or joint effusion. There is no evidence of arthropathy or other focal bone abnormality. Soft tissues are unremarkable. IMPRESSION: No left ankle fracture or subluxation. Electronically Signed   By: Ilona Sorrel M.D.   On: 07/23/2021 11:07    Procedures Procedures    Medications Ordered in ED Medications - No data to display  ED Course/ Medical Decision Making/ A&P                           Medical Decision Making Amount and/or Complexity of Data Reviewed Radiology: ordered.   This patient presents to the ED for concern of left ankle pain, this involves an extensive number of treatment options, and is a complaint that carries with it a high risk of complications and morbidity.  The differential diagnosis includes fracture, dislocation, compartment syndrome    Additional history obtained:  Additional history obtained from N/A External records from outside source obtained and reviewed including patient's medical history, medications recent imaging   Co morbidities that complicate the patient evaluation  N/A  Social  Determinants of Health:  Alcohol dependency    Lab Tests:  I Ordered, and personally interpreted labs.  The pertinent results include: N/A   Imaging Studies ordered:  I ordered imaging studies including DG of left ankle I independently visualized and interpreted imaging which showed negative acute findings I agree with the radiologist interpretation   Cardiac Monitoring:  The patient was maintained on a cardiac monitor.  I personally viewed and interpreted the cardiac monitored which showed an underlying rhythm of: N/A   Medicines ordered and prescription drug management:  I ordered medication including N/A I have reviewed the patients home medicines and have made adjustments as needed  Critical Interventions:  N/A   Reevaluation:  Presents with left ankle pain, she was obtain imaging which I personally viewed there unremarkable.  Patient benign physical exam.  He is agreement with plan to discharge at this time.  Consultations Obtained:  N/A   Test Considered:  CT head, C-spine-I recommended obtaining CT head and C-spine as patient was intoxicated while he fall and explained that he is at increased risk for subdural bleed, patient states that he does not want this as he has no complaints, and will come back in if he needs to.    Rule out I have low suspicion for septic arthritis as patient denies IV drug use, skin exam was performed no erythematous, edematous, warm joints noted on exam, no new heart murmur heard on exam.  Low suspicion for fracture or dislocation as x-ray does not feel any significant findings. low suspicion for ligament or tendon damage as area was palpated no gross defects noted, they had full range of motion as well as 5/5 strength.  Low suspicion for compartment syndrome as area was palpated it was soft to the touch, neurovascular fully intact.  Low suspicion for alcohol withdrawal is nontremulous on exam vital signs reassuring.  Low suspicion  for psychiatric emergency denies any suicidal homicidal ideations.     Dispostion and problem list  After consideration of the diagnostic results and the patients response to treatment, I feel that the patent would benefit from discharge.  Left ankle pain-likely a strain, placed in a brace, follow-up with Ortho for further evaluation. Alcohol dependency-provided with outpatient resources for substance dependency.            Final Clinical Impression(s) / ED Diagnoses Final diagnoses:  Acute left ankle pain  Alcohol use    Rx / DC Orders ED Discharge Orders     None         Johnluke, [place], PA-C 07/23/21 1259    Sherwood Gambler, MD 07/25/21 1353

## 2021-07-30 ENCOUNTER — Ambulatory Visit: Payer: Medicaid Other | Admitting: Orthopedic Surgery

## 2021-07-30 ENCOUNTER — Encounter: Payer: Self-pay | Admitting: Orthopedic Surgery

## 2021-07-30 VITALS — Ht 67.0 in | Wt 141.0 lb

## 2021-07-30 DIAGNOSIS — S93402A Sprain of unspecified ligament of left ankle, initial encounter: Secondary | ICD-10-CM | POA: Diagnosis not present

## 2021-07-30 NOTE — Progress Notes (Signed)
New Patient Visit  Assessment: Shane Allison is a 56 y.o. male with the following: 1. Inversion sprain of left ankle, initial encounter  Plan: Shane Allison sustained a left ankle sprain.  On physical exam, he has tenderness over the anterior and lateral ankle.  No swelling.  No bruising.  He should wean out of the walking boot.  We have provided him with a short walking boot today.  Medications as needed.  I provided him with home exercises for his left ankle sprain.  I am not sure why he is having some numbness in the proximal lower leg.  This may or may not be related to his current injury.  Follow-up: Return if symptoms worsen or fail to improve.  Subjective:  Chief Complaint  Patient presents with   Ankle Injury    Lt ankle injury. Unsure of how the injury happened but is having numbness up side of leg since.     History of Present Illness: Shane Allison is a 56 y.o. male who presents for evaluation of left ankle pain.  He was seen in the emergency department, approximately 1 week ago for left ankle pain.  It is unclear what caused this pain.  He states he blacked out after taking stronger medication.  Afterwards, he started to have pain in the left ankle.  He continues to have some pain, which radiates proximally.  He also notes some numbness over the anterior lateral proximal tibia area.  He has been using a boot to assist with ambulation   Review of Systems: No fevers or chills No numbness or tingling No chest pain No shortness of breath No bowel or bladder dysfunction No GI distress No headaches   Medical History:  Past Medical History:  Diagnosis Date   Abnormal result of iron profile testing 03/03/2016   AKI (acute kidney injury) ([place]) 05/10/2015   Alcohol use    Asthma    Cervical radiculopathy    Cervical spondylosis without myelopathy 05/14/2013   Chronic neck pain    COPD (chronic obstructive pulmonary disease) (H[facility])    GERD (gastroesophageal  reflux disease)    GI bleed 05/10/2015   Hypertension    Joint ache    Right AC joint separation   Numbness and tingling in hands 05/14/2013   Partial tear of rotator cuff 04/02/2013   Pneumonia    Psoriasis    Radicular pain in right arm 05/14/2013   Shortness of breath dyspnea     Past Surgical History:  Procedure Laterality Date   ACROMIO-CLAVICULAR JOINT REPAIR Right 10/26/2017   Procedure: RIGHT ACROMIO-CLAVICULAR JOINT RECONSTRUCTION;  Surgeon: Meredith Pel, MD;  Location: [place];  Service: Orthopedics;  Laterality: Right;   BIOPSY  05/12/2015   Procedure: BIOPSY;  Surgeon: Danie Binder, MD;  [facility];  Service: Endoscopy;;  gastric biopsy   BIOPSY  03/15/2016   Procedure: BIOPSY;  Surgeon: Danie Binder, MD;  [facility];  Service: Endoscopy;;  random colon bx's   COLONOSCOPY WITH PROPOFOL N/A 03/15/2016   Procedure: COLONOSCOPY WITH PROPOFOL;  Surgeon: Danie Binder, MD;  [facility];  Service: Endoscopy;  Laterality: N/A;  12:30 PM   ESOPHAGOGASTRODUODENOSCOPY (EGD) WITH PROPOFOL N/A 05/12/2015   Dr. Oneida Alar: normal esophagus, gastritis, bleeding friable duodenal mucosa   HAND TENDON SURGERY Right    MULTIPLE TOOTH EXTRACTIONS     POLYPECTOMY  03/15/2016   Procedure: POLYPECTOMY;  Surgeon: Danie Binder, MD;  [facility];  Service: Endoscopy;;  rectal polyp   THUMB AMPUTATION     Partial left thumb     Family History  Problem Relation Age of Onset   COPD Mother    Colon cancer Neg Hx    Social History   Tobacco Use   Smoking status: Every Day    Packs/day: 2.00    Years: 40.00    Total pack years: 80.00    Types: Cigarettes   Smokeless tobacco: Never   Tobacco comments:    Now smoking one pack a day 06/02/21  Vaping Use   Vaping Use: Never used  Substance Use Topics   Alcohol use: Yes    Comment: PATIENT REPORT 6 PACK OF BEER a day, last use x 2 days ago on 07/21/21   Drug use: No    Allergies  Allergen  Reactions   Penicillins Rash    Has patient had a PCN reaction causing immediate rash, facial/tongue/throat swelling, SOB or lightheadedness with hypotension: Yes Has patient had a PCN reaction causing severe rash involving mucus membranes or skin necrosis: No Has patient had a PCN reaction that required hospitalization No Has patient had a PCN reaction occurring within the last 10 years: No If all of the above answers are "NO", then may proceed with Cephalosporin use.      Current Meds  Medication Sig   acetaminophen (TYLENOL) 500 MG tablet Take 500 mg by mouth every 6 (six) hours as needed. Takes as needed   amLODipine (NORVASC) 10 MG tablet Take 1 tablet (10 mg total) by mouth daily.   benzonatate (TESSALON) 100 MG capsule Take 1 capsule (100 mg total) by mouth 2 (two) times daily as needed for cough.   cetirizine (ZYRTEC) 10 MG tablet Take 1 tablet (10 mg total) by mouth daily.   STIOLTO RESPIMAT 2.5-2.5 MCG/ACT AERS INHALE 2 PUFFS INTO THE LUNGS DAILY   TALTZ 80 MG/ML SOAJ Inject 80 mg into the skin every 30 (thirty) days.    VENTOLIN HFA 108 (90 Base) MCG/ACT inhaler Inhale 2 puffs into the lungs every 6 (six) hours as needed for wheezing or shortness of breath.    Objective: Ht '5\' 7"'$  (1.702 m)   Wt 141 lb (64 kg)   BMI 22.08 kg/m   Physical Exam:  General: Alert and oriented. and No acute distress. Gait: Left sided antalgic gait.  In a walking boot.  Left ankle without bruising.  No swelling.  Tenderness palpation over the anterior lateral ankle.  Good strength with dorsiflexion plantarflexion inversion and eversion.  Toes warm and well-perfused.  Decreased sensation over the proximal TA muscle belly.  No bruising in this area.  No tenderness palpation about the proximal fibular head.    IMAGING: I personally reviewed images previously obtained from the ED  Left ankle without fracture.  No acute injuries noted.  New Medications:  No orders of the defined types were  placed in this encounter.     [name], MD  07/30/2021 2:56 PM

## 2021-07-30 NOTE — Patient Instructions (Signed)

## 2021-08-05 ENCOUNTER — Encounter: Payer: Self-pay | Admitting: Nurse Practitioner

## 2021-08-05 ENCOUNTER — Ambulatory Visit: Payer: Medicaid Other | Admitting: Nurse Practitioner

## 2021-08-05 VITALS — BP 132/74 | HR 74 | Ht 67.0 in | Wt 141.0 lb

## 2021-08-05 DIAGNOSIS — E875 Hyperkalemia: Secondary | ICD-10-CM

## 2021-08-05 DIAGNOSIS — I10 Essential (primary) hypertension: Secondary | ICD-10-CM

## 2021-08-05 DIAGNOSIS — F1010 Alcohol abuse, uncomplicated: Secondary | ICD-10-CM

## 2021-08-05 DIAGNOSIS — E871 Hypo-osmolality and hyponatremia: Secondary | ICD-10-CM | POA: Diagnosis not present

## 2021-08-05 DIAGNOSIS — Z720 Tobacco use: Secondary | ICD-10-CM

## 2021-08-05 NOTE — Assessment & Plan Note (Signed)
Metrics: Intervention Frequency ACO  Smokes about 1 pack/day  Asked about quitting: confirms that he/she currently smokes cigarettes Advise to quit smoking: Educated about QUITTING to reduce the risk of cancer, cardio and cerebrovascular disease. Assess willingness: Unwilling to quit at this time, but is working on cutting back. Assist with counseling and pharmacotherapy: Counseled for 5 minutes and literature provided. Arrange for follow up: follow up in 4 months and continue to offer help.

## 2021-08-05 NOTE — Assessment & Plan Note (Signed)
Chronic condition He has quit drinking alcohol since 15 days ago Check CMP today Patient encouraged to continue to abstain from alcohol.

## 2021-08-05 NOTE — Assessment & Plan Note (Signed)
BP Readings from Last 3 Encounters:  08/05/21 132/74  07/23/21 138/70  06/02/21 124/82  Chronic condition well-controlled on amlodipine 10 mg daily, he was taken off amlodipine-olmersartan at last visit.  DASH diet advised engage in regular exercises at least 150 minutes weekly Had hyperkalemia at last visit Check CMP today Lab Results  Component Value Date   NA 127 (L) 06/15/2021   K 5.5 (H) 06/15/2021   CO2 20 06/15/2021   GLUCOSE 99 06/15/2021   BUN 11 06/15/2021   CREATININE 1.03 06/15/2021   CALCIUM 9.6 06/15/2021   EGFR 87 06/15/2021   GFRNONAA 103 03/12/2020

## 2021-08-05 NOTE — Assessment & Plan Note (Signed)
He has quit drinking alcohol since May 31.  Patient encouraged to continue to abstain from alcohol, risk of alcohol intoxication due to drinking alcohol discussed with patient he verbalized understanding

## 2021-08-05 NOTE — Progress Notes (Signed)
Shane Allison     MRN: [id number]      DOB: [birth year]   HPI Shane Allison with past medical history of hypertension, COPD, GERD, hyponatremia, alcohol abuse, tobacco abuse is here for follow up for hypertension, hyponatremia, hyperkalemia.    Hypertension.  Currently taking amlodipine 10 mg daily, denies chest pain, shortness of breath, edema.    Alcohol abuse . he has quit drinking alcohol since 5/31 , states that someone gave him him some pain pill which he thinks was illicit drug, he got overdosed and fell , hitting  his head on the floor, he went to the ER after the incidence.  Has left wrist pain, took  tylenol this morning Tylenol helps his pain, currently denies wrist pain, denies numbness, tingling, headaches, confusion, seizures, changes in his vision.      ROS Denies recent fever or chills. Denies sinus pressure, nasal congestion, ear pain or sore throat. Denies chest congestion, productive cough or wheezing. Denies chest pains, palpitations and leg swelling Denies abdominal pain, nausea, vomiting,diarrhea or constipation.   Denies dysuria, frequency, hesitancy or incontinence. Denies headaches, seizures, numbness, or tingling. Denies depression, anxiety or insomnia.    PE  BP 132/74 (BP Location: Right Arm, Cuff Size: Normal)   Pulse 74   Ht '5\' 7"'  (1.702 m)   Wt 141 lb (64 kg)   SpO2 92%   BMI 22.08 kg/m   Patient alert and oriented and in no cardiopulmonary distress.  HEENT: No facial asymmetry, EOMI,     Neck supple .  Chest: Clear to auscultation bilaterally.  CVS: S1, S2 no murmurs, no S3.Regular rate.  ABD: Soft non tender.   Ext: No edema  MS: Adequate ROM spine, shoulders, hips and knees, able to do ROM of right wrist, no tenderness or swelling noted on palpation   Psych: Good eye contact, normal affect. Memory intact not anxious or depressed appearing.  CNS: CN 2-12 intact, power,  normal throughout.no focal deficits noted.   Assessment  & Plan  Hypertension, essential BP Readings from Last 3 Encounters:  08/05/21 132/74  07/23/21 138/70  06/02/21 124/82  Chronic condition well-controlled on amlodipine 10 mg daily, he was taken off amlodipine-olmersartan at last visit.  DASH diet advised engage in regular exercises at least 150 minutes weekly Had hyperkalemia at last visit Check CMP today Lab Results  Component Value Date   NA 127 (L) 06/15/2021   K 5.5 (H) 06/15/2021   CO2 20 06/15/2021   GLUCOSE 99 06/15/2021   BUN 11 06/15/2021   CREATININE 1.03 06/15/2021   CALCIUM 9.6 06/15/2021   EGFR 87 06/15/2021   GFRNONAA 103 03/12/2020    Alcohol abuse He has quit drinking alcohol since May 31.  Patient encouraged to continue to abstain from alcohol, risk of alcohol intoxication due to drinking alcohol discussed with patient he verbalized understanding  Hyponatremia Chronic condition He has quit drinking alcohol since 15 days ago Check CMP today Patient encouraged to continue to abstain from alcohol.    Tobacco abuse Metrics: Intervention Frequency ACO  Smokes about 1 pack/day  Asked about quitting: confirms that he/she currently smokes cigarettes Advise to quit smoking: Educated about QUITTING to reduce the risk of cancer, cardio and cerebrovascular disease. Assess willingness: Unwilling to quit at this time, but is working on cutting back. Assist with counseling and pharmacotherapy: Counseled for 5 minutes and literature provided. Arrange for follow up: follow up in 4 months and continue to offer help.   Hyperkalemia  Lab Results  Component Value Date   NA 127 (L) 06/15/2021   K 5.5 (H) 06/15/2021   CO2 20 06/15/2021   GLUCOSE 99 06/15/2021   BUN 11 06/15/2021   CREATININE 1.03 06/15/2021   CALCIUM 9.6 06/15/2021   EGFR 87 06/15/2021   GFRNONAA 103 03/12/2020  He has since stopped taking olmesartan, currently on amlodipine 10 mg daily.  Not taking potassium supplements Recheck CMP today

## 2021-08-05 NOTE — Assessment & Plan Note (Signed)
Lab Results  Component Value Date   NA 127 (L) 06/15/2021   K 5.5 (H) 06/15/2021   CO2 20 06/15/2021   GLUCOSE 99 06/15/2021   BUN 11 06/15/2021   CREATININE 1.03 06/15/2021   CALCIUM 9.6 06/15/2021   EGFR 87 06/15/2021   GFRNONAA 103 03/12/2020  He has since stopped taking olmesartan, currently on amlodipine 10 mg daily.  Not taking potassium supplements Recheck CMP today

## 2021-08-05 NOTE — Patient Instructions (Signed)

## 2021-08-06 ENCOUNTER — Telehealth: Payer: Self-pay | Admitting: Nurse Practitioner

## 2021-08-06 LAB — BASIC METABOLIC PANEL
BUN/Creatinine Ratio: 10 (ref 9–20)
BUN: 8 mg/dL (ref 6–24)
CO2: 20 mmol/L (ref 20–29)
Calcium: 9.4 mg/dL (ref 8.7–10.2)
Chloride: 99 mmol/L (ref 96–106)
Creatinine, Ser: 0.8 mg/dL (ref 0.76–1.27)
Glucose: 93 mg/dL (ref 70–99)
Potassium: 4.4 mmol/L (ref 3.5–5.2)
Sodium: 136 mmol/L (ref 134–144)
eGFR: 106 mL/min/{1.73_m2} (ref >59)

## 2021-08-06 NOTE — Telephone Encounter (Signed)
Pt returned call for labs 

## 2021-08-06 NOTE — Progress Notes (Signed)
Normal sodium normal potassium please continue to take current medications Please do not go back to drinking alcohol, you had the best labs in months, alcohol was contributing to your low sodium levels so please continue to avoid alcohol.

## 2021-08-06 NOTE — Telephone Encounter (Signed)
Spoke with pt

## 2021-08-31 ENCOUNTER — Other Ambulatory Visit: Payer: Self-pay | Admitting: Nurse Practitioner

## 2021-11-02 ENCOUNTER — Encounter: Payer: Self-pay | Admitting: Nurse Practitioner

## 2021-11-02 ENCOUNTER — Ambulatory Visit: Payer: Medicaid Other | Admitting: Nurse Practitioner

## 2021-11-02 VITALS — BP 142/88 | HR 83 | Ht 67.0 in | Wt 141.0 lb

## 2021-11-02 DIAGNOSIS — J449 Chronic obstructive pulmonary disease, unspecified: Secondary | ICD-10-CM | POA: Diagnosis not present

## 2021-11-02 DIAGNOSIS — I10 Essential (primary) hypertension: Secondary | ICD-10-CM

## 2021-11-02 DIAGNOSIS — M25532 Pain in left wrist: Secondary | ICD-10-CM

## 2021-11-02 DIAGNOSIS — R911 Solitary pulmonary nodule: Secondary | ICD-10-CM | POA: Diagnosis not present

## 2021-11-02 DIAGNOSIS — F1010 Alcohol abuse, uncomplicated: Secondary | ICD-10-CM

## 2021-11-02 DIAGNOSIS — Z720 Tobacco use: Secondary | ICD-10-CM

## 2021-11-02 DIAGNOSIS — Z2821 Immunization not carried out because of patient refusal: Secondary | ICD-10-CM

## 2021-11-02 MED ORDER — VENTOLIN HFA 108 (90 BASE) MCG/ACT IN AERS: 2.0000 | INHALATION_SPRAY | Freq: Four times a day (QID) | RESPIRATORY_TRACT | 3 refills | Status: DC | PRN

## 2021-11-02 MED ORDER — METHYLPREDNISOLONE ACETATE 80 MG/ML IJ SUSP
80.0000 mg | Freq: Once | INTRAMUSCULAR | Status: AC
  Administered 2021-11-02: 80 mg via INTRAMUSCULAR

## 2021-11-02 MED ORDER — DICLOFENAC SODIUM 1 % EX GEL: 2.0000 g | Freq: Four times a day (QID) | CUTANEOUS | 0 refills | Status: DC

## 2021-11-02 MED ORDER — KETOROLAC TROMETHAMINE 60 MG/2ML IM SOLN
60.0000 mg | Freq: Once | INTRAMUSCULAR | Status: AC
  Administered 2021-11-02: 60 mg via INTRAMUSCULAR

## 2021-11-02 NOTE — Assessment & Plan Note (Signed)
Smokes about 2 packs pack/day  Asked about quitting: confirms that he/she currently smokes cigarettes Advise to quit smoking: Educated about QUITTING to reduce the risk of cancer, cardio and cerebrovascular disease. Assess willingness: Unwilling to quit at this time, not  working on cutting back. Assist with counseling and pharmacotherapy: Counseled for 5 minutes and literature provided. Arrange for follow up: follow up in 4 months and continue to offer help.

## 2021-11-02 NOTE — Addendum Note (Signed)
Addended by: Jill Side on: 11/02/2021 12:21 PM   Modules accepted: Orders

## 2021-11-02 NOTE — Assessment & Plan Note (Addendum)
He has since resumed drinking Currently drinks 8 bottles of beer daily Need to quit drinking alcohol including risk of liver disease, falls discussed with patient, materials for AA given to the patient

## 2021-11-02 NOTE — Assessment & Plan Note (Signed)
Chronic condition well-controlled, denies cough shortness of breath or wheezing On Stiolto 2.5-2.5 mcg 2 puffs daily, Ventolin 90 mcg/ACT inhaler 2 puffs every 6 hours as needed Patient counseled on smoking cessation

## 2021-11-02 NOTE — Patient Instructions (Addendum)
You were given depomedrol and toradol injection today   Please continue consider quitting alcohol and cigarettes smoking   Apply Voltaren gel 2 g up to 4 times daily as needed for your wrist pain, Tylenol 500 mg every 6 hours as needed and follow-up with orthopedics  It is important that you exercise regularly at least 30 minutes 5 times a week.  Think about what you will eat, plan ahead. Choose " clean, green, fresh or frozen" over canned, processed or packaged foods which are more sugary, salty and fatty. 70 to 75% of food eaten should be vegetables and fruit. Three meals at set times with snacks allowed between meals, but they must be fruit or vegetables. Aim to eat over a 12 hour period , example 7 am to 7 pm, and STOP after  your last meal of the day. Drink water,generally about 64 ounces per day, no other drink is as healthy. Fruit juice is best enjoyed in a healthy way, by EATING the fruit.  Thanks for choosing [facility], we consider it a privelige to serve you.

## 2021-11-02 NOTE — Progress Notes (Signed)
Established Patient Office Visit  Subjective   Patient ID: Shane Allison, male    DOB: 02/04/1966  Age: 56 y.o. MRN: [id number]  Chief Complaint  Patient presents with   Hypertension    5 month f/u   Wrist Pain    Since 08/22/21 knot on the top of it     [name] with past medical history of hypertension, COPD, GERD, alcohol abuse, tobacco abuse presents for follow-up for his chronic medical conditions.    Wrist pain has a knot on the top of his left hand, joints popping since about 2 months has aching pain 5/10, he has taken 6 tylenol already today ,pain is worse at night , denies numbness, tingling . Injured his ankle 3 months ago but he doies not remember injuring his hand. Wrst pain is geeting is getting worse.     COPD . smokes 2 packs of cigarettes daily, started smoking at age 41 years , patient denies shortness of breath, wheezing, cough  Alcohol abuse drank 6 beers last night . He quits for 2 weeks at one time but has since resumed drinking  , he was poreviously drinking 18 bootles of alcohol daily.  Patient denies nausea, vomiting, abdominal pain.    Hypertension currently on amlodipine 10 mg daily patient reports that he has been taking the medication daily as ordered he denies dizziness, edema, syncope.   He refused flu vaccine today ,states that he was told by dermatology not to take flu vaccines.    Review of Systems  Constitutional:  Negative for chills, fever and weight loss.  Respiratory:  Negative for cough, hemoptysis, sputum production, shortness of breath and wheezing.   Cardiovascular:  Negative for chest pain, palpitations, orthopnea and claudication.  Musculoskeletal:  Positive for joint pain.  Neurological:  Negative for dizziness, tingling and headaches.  Psychiatric/Behavioral:  Negative for depression, hallucinations, substance abuse and suicidal ideas.       Objective:     BP (!) 142/88   Pulse 83   Ht '5\' 7"'  (1.702 m)   Wt 141 lb (64 kg)    SpO2 93%   BMI 22.08 kg/m    Physical Exam Constitutional:      General: He is not in acute distress.    Appearance: He is not ill-appearing, toxic-appearing or diaphoretic.  Cardiovascular:     Pulses: Normal pulses.     Heart sounds: Normal heart sounds. No murmur heard.    No friction rub. No gallop.  Pulmonary:     Effort: No respiratory distress.     Breath sounds: Normal breath sounds. No stridor. No wheezing, rhonchi or rales.  Chest:     Chest wall: No tenderness.  Musculoskeletal:     Comments: Left wrist tenderness on ROM , no swelling noted, skin warm and dry, has palpable radial pulse . Crepitus noted   Skin:    General: Skin is warm and dry.     Capillary Refill: Capillary refill takes less than 2 seconds.     Coloration: Skin is not jaundiced or pale.     Findings: No bruising, erythema, lesion or rash.  Neurological:     Mental Status: He is alert and oriented to person, place, and time.     Cranial Nerves: No cranial nerve deficit.     Sensory: No sensory deficit.     Motor: No weakness.     Gait: Gait normal.  Psychiatric:        Mood and Affect:  Mood normal.        Behavior: Behavior normal.        Thought Content: Thought content normal.      No results found for any visits on 11/02/21.    The 10-year ASCVD risk score (Arnett DK, et al., 2019) is: 8.3%    Assessment & Plan:   Problem List Items Addressed This Visit       Cardiovascular and Mediastinum   Hypertension, essential    BP Readings from Last 3 Encounters:  11/02/21 (!) 142/88  08/05/21 132/74  07/23/21 138/70  Blood pressures slightly elevated in the office today probably due to his wrist pain Currently on amlodipine 10 mg daily Continue current medication DASH diet advised engage in regular daily moderate exercises at least 150 minutes weekly Follow-up in 4 months Has history of hyponatremia and since he has started drinking again will check CMP today        Relevant  Orders   CMP14+EGFR     Respiratory   COPD (chronic obstructive pulmonary disease) (HCC)    Chronic condition well-controlled, denies cough shortness of breath or wheezing On Stiolto 2.5-2.5 mcg 2 puffs daily, Ventolin 90 mcg/ACT inhaler 2 puffs every 6 hours as needed Patient counseled on smoking cessation       Relevant Medications   VENTOLIN HFA 108 (90 Base) MCG/ACT inhaler   Lung nodule seen on imaging study    Noted on imaging done in 2018 Follow-up CT chest ordered today, smokes 2 packs of cigarettes daily  Counseled on smoking cessation      Relevant Orders   CT CHEST NODULE FOLLOW UP LOW DOSE W/O     Other   Alcohol abuse    He has since resumed drinking Currently drinks 8 bottles of beer daily Need to quit drinking alcohol including risk of liver disease, falls discussed with patient, materials for AA given to the patient       Relevant Orders   CMP14+EGFR   Tobacco abuse    Smokes about 2 packs pack/day  Asked about quitting: confirms that he/she currently smokes cigarettes Advise to quit smoking: Educated about QUITTING to reduce the risk of cancer, cardio and cerebrovascular disease. Assess willingness: Unwilling to quit at this time, not  working on cutting back. Assist with counseling and pharmacotherapy: Counseled for 5 minutes and literature provided. Arrange for follow up: follow up in 4 months and continue to offer help.      Refused influenza vaccine - Primary   Left wrist pain    Need to avoid excess use of Tylenol discussed with patient Take Tylenol 500 mg every 6 hours as needed, Voltaren gel 2 g 4 times daily as needed ordered Depo-Medrol 23m IM and Toradol 646mIM injections given in the office today Follow-up with orthopedics      Relevant Medications   diclofenac Sodium (VOLTAREN) 1 % GEL    Return in about 4 months (around 03/04/2022) for CPE.    FoRenee RivalFNP

## 2021-11-02 NOTE — Assessment & Plan Note (Addendum)
Noted on imaging done in 2018 Follow-up CT chest ordered today, smokes 2 packs of cigarettes daily  Counseled on smoking cessation

## 2021-11-02 NOTE — Assessment & Plan Note (Signed)
Need to avoid excess use of Tylenol discussed with patient Take Tylenol 500 mg every 6 hours as needed, Voltaren gel 2 g 4 times daily as needed ordered Depo-Medrol '80mg'$  IM and Toradol '60mg'$  IM injections given in the office today Follow-up with orthopedics

## 2021-11-02 NOTE — Assessment & Plan Note (Addendum)
BP Readings from Last 3 Encounters:  11/02/21 (!) 142/88  08/05/21 132/74  07/23/21 138/70  Blood pressures slightly elevated in the office today probably due to his wrist pain Currently on amlodipine 10 mg daily Continue current medication DASH diet advised engage in regular daily moderate exercises at least 150 minutes weekly Follow-up in 4 months Has history of hyponatremia and since he has started drinking again will check CMP today

## 2021-11-03 LAB — CMP14+EGFR
ALT: 11 IU/L (ref 0–44)
AST: 20 IU/L (ref 0–40)
Albumin/Globulin Ratio: 1.8 (ref 1.2–2.2)
Albumin: 4.7 g/dL (ref 3.8–4.9)
Alkaline Phosphatase: 60 IU/L (ref 44–121)
BUN/Creatinine Ratio: 11 (ref 9–20)
BUN: 9 mg/dL (ref 6–24)
Bilirubin Total: 0.4 mg/dL (ref 0.0–1.2)
CO2: 21 mmol/L (ref 20–29)
Calcium: 9.4 mg/dL (ref 8.7–10.2)
Chloride: 97 mmol/L (ref 96–106)
Creatinine, Ser: 0.81 mg/dL (ref 0.76–1.27)
Globulin, Total: 2.6 g/dL (ref 1.5–4.5)
Glucose: 81 mg/dL (ref 70–99)
Potassium: 4.4 mmol/L (ref 3.5–5.2)
Sodium: 134 mmol/L (ref 134–144)
Total Protein: 7.3 g/dL (ref 6.0–8.5)
eGFR: 105 mL/min/{1.73_m2} (ref >59)

## 2021-11-03 NOTE — Progress Notes (Signed)
Stable labs continue current medication

## 2021-11-03 NOTE — Progress Notes (Signed)
Stable labs continue current meds

## 2021-11-08 ENCOUNTER — Ambulatory Visit (HOSPITAL_COMMUNITY)
Admission: RE | Admit: 2021-11-08 | Discharge: 2021-11-08 | Disposition: A | Discharge status: Home / Self Care | Payer: Medicaid Other | Source: Ambulatory Visit | Attending: Nurse Practitioner | Admitting: Nurse Practitioner

## 2021-11-08 ENCOUNTER — Other Ambulatory Visit: Payer: Self-pay | Admitting: Nurse Practitioner

## 2021-11-08 DIAGNOSIS — R911 Solitary pulmonary nodule: Secondary | ICD-10-CM | POA: Insufficient documentation

## 2021-11-10 NOTE — Progress Notes (Signed)
Several new pulmonary nodules found, nodules favored to be infectious or inflammatory in nature most likely due to his COPD, we will follow up with Non-contrast chest CT  in 12 months.

## 2021-11-12 ENCOUNTER — Ambulatory Visit (INDEPENDENT_AMBULATORY_CARE_PROVIDER_SITE_OTHER): Payer: Medicaid Other

## 2021-11-12 ENCOUNTER — Encounter: Payer: Self-pay | Admitting: Orthopedic Surgery

## 2021-11-12 ENCOUNTER — Ambulatory Visit: Payer: Medicaid Other | Admitting: Orthopedic Surgery

## 2021-11-12 DIAGNOSIS — M25532 Pain in left wrist: Secondary | ICD-10-CM

## 2021-11-12 DIAGNOSIS — S66902A Unspecified injury of unspecified muscle, fascia and tendon at wrist and hand level, left hand, initial encounter: Secondary | ICD-10-CM | POA: Diagnosis not present

## 2021-11-12 DIAGNOSIS — S6982XA Other specified injuries of left wrist, hand and finger(s), initial encounter: Secondary | ICD-10-CM

## 2021-11-13 ENCOUNTER — Encounter: Payer: Self-pay | Admitting: Orthopedic Surgery

## 2021-11-13 NOTE — Progress Notes (Signed)
Orthopaedic Clinic Return  Assessment: Shane Allison is a 56 y.o. male with the following: Ulnar-sided left wrist pain, possible TFCC injury Snapping of the left long finger extensor tendon  Plan: [name] has progressively worsening left wrist and hand pain after a fall, several months ago.  X-rays are negative.  He continues to have pain in the ulnar side of the left wrist, which gets worse with palpation of the TFCC, as well as ulnar deviation.  He also notes progressively worsening snapping of the extensor tendon of the long finger.  The more he uses his hand and when this bothers him.  At this point, I would like him to be evaluated by a hand specialist in order to determine the next steps.  Follow-up: Return for Referral to hand specialist.   Subjective:  Chief Complaint  Patient presents with   Wrist Pain    LEFT wrist// patient thinks he injured his wrist at the same time as he did his ankle but didn't realize it at the time Pain increases with movement    History of Present Illness: Shane Allison is a 56 y.o. male who returns to clinic for evaluation of left hand pain.  He fell several months ago, and sustained an ankle sprain.  I saw him for this injury, and he is improved.  However, he has progressively worsening pain in his left wrist and hand.  He thinks that this happened at the same time as his ankle injury.  However, he continues to describe worsening pain.  He has pain in the ulnar side of his wrist, as well as the dorsum of his hand.  He notes a snapping sensation in the dorsum of his hand which gets worse the more he uses his hand.  He also notes symptoms consistent with carpal tunnel syndrome.   Review of Systems: No fevers or chills + numbness or tingling No chest pain No shortness of breath No bowel or bladder dysfunction No GI distress No headaches   Objective: There were no vitals taken for this visit.  Physical Exam:  Left hand without  deformity.  No bruising.  No swelling.  He has tenderness to palpation within the TFCC.  Pain with axial loading and ulnar deviation.  With extension and making a fist of the left hand, he has a snapping sensation on the dorsum of the hand.  The extensor tendon appears to be unstable.  He is able to make a full fist.  He currently has decreased sensation within the median nerve distribution.  IMAGING: I personally ordered and reviewed the following images:  X-rays of the left wrist were obtained in clinic today.  No acute injuries are noted.  Minimal degenerative changes.  No evidence of an injury to the ulnar styloid.  Impression: Negative left wrist x-ray.   Shane Rasmussen, MD 11/13/2021 11:58 AM

## 2021-11-29 DIAGNOSIS — M79642 Pain in left hand: Secondary | ICD-10-CM | POA: Diagnosis not present

## 2021-11-29 DIAGNOSIS — W19XXXA Unspecified fall, initial encounter: Secondary | ICD-10-CM | POA: Diagnosis not present

## 2021-12-02 ENCOUNTER — Encounter: Payer: Self-pay | Admitting: Internal Medicine

## 2021-12-02 ENCOUNTER — Ambulatory Visit: Payer: Medicaid Other | Admitting: Internal Medicine

## 2021-12-02 VITALS — BP 138/78 | HR 89 | Ht 67.0 in | Wt 140.2 lb

## 2021-12-02 DIAGNOSIS — I10 Essential (primary) hypertension: Secondary | ICD-10-CM

## 2021-12-02 NOTE — Assessment & Plan Note (Signed)
BP 149/89 initially, improved to 138/78 on repeat.  He is currently prescribed amlodipine 10 mg daily.  However, today he took amlodipine-olmesartan 5-40 mg because he was concerned that his blood pressure would be elevated.  He does not currently check his blood pressure at home.  He did not understand why amlodipine-olmesartan was discontinued.  I explained to him that it was because of hyperkalemia. -No medication changes today.  I have asked Shane Allison to take amlodipine 10 mg daily as previously prescribed over the next 2 weeks.  He will also check his blood pressure daily and record readings in a BP log.  He will bring these readings to his next appointment.  If his blood pressure remains above goal, we will consider further medication changes.  He is rather anxious today and I do suspect that this is contributing to his elevated blood pressure readings.

## 2021-12-02 NOTE — Progress Notes (Signed)
Established Patient Office Visit  Subjective   Patient ID: Shane Allison, male    DOB: [birth year]  Age: 56 y.o. MRN: [id number]  Chief Complaint  Patient presents with   Follow-up    HTN   Shane Allison returns for care today.  He is a 56 year old male with a past medical history significant for hypertension, COPD, and alcohol abuse.  He was last seen at [facility] by Vena Rua, NP on 9/12.  He returns today for HTN follow-up.  Shane Allison is prescribed amlodipine 10 mg daily.  He was previously prescribed amlodipine-olmesartan 5-40 mg daily, but this was discontinued in April due to hyperkalemia.  He was instead prescribed amlodipine 10 mg daily.  Shane Allison does not check his blood pressure at home.  He states that today he took amlodipine-olmesartan that was left over because he was concerned that his blood pressure was elevated.  He is otherwise asymptomatic and has no acute concerns.  Past Medical History:  Diagnosis Date   Abnormal result of iron profile testing 03/03/2016   AKI (acute kidney injury) ([place]) 05/10/2015   Alcohol use    Asthma    Cervical radiculopathy    Cervical spondylosis without myelopathy 05/14/2013   Chronic neck pain    COPD (chronic obstructive pulmonary disease) (HCC)    GERD (gastroesophageal reflux disease)    GI bleed 05/10/2015   Hypertension    Joint ache    Right AC joint separation   Numbness and tingling in hands 05/14/2013   Partial tear of rotator cuff 04/02/2013   Pneumonia    Psoriasis    Radicular pain in right arm 05/14/2013   Shortness of breath dyspnea    Past Surgical History:  Procedure Laterality Date   ACROMIO-CLAVICULAR JOINT REPAIR Right 10/26/2017   Procedure: RIGHT ACROMIO-CLAVICULAR JOINT RECONSTRUCTION;  Surgeon: Meredith Pel, MD;  Location: [place];  Service: Orthopedics;  Laterality: Right;   BIOPSY  05/12/2015   Procedure: BIOPSY;  Surgeon: Danie Binder, MD;  [facility];  Service: Endoscopy;;  gastric  biopsy   BIOPSY  03/15/2016   Procedure: BIOPSY;  Surgeon: Danie Binder, MD;  [facility];  Service: Endoscopy;;  random colon bx's   COLONOSCOPY WITH PROPOFOL N/A 03/15/2016   Procedure: COLONOSCOPY WITH PROPOFOL;  Surgeon: Danie Binder, MD;  [facility];  Service: Endoscopy;  Laterality: N/A;  12:30 PM   ESOPHAGOGASTRODUODENOSCOPY (EGD) WITH PROPOFOL N/A 05/12/2015   Dr. Oneida Alar: normal esophagus, gastritis, bleeding friable duodenal mucosa   HAND TENDON SURGERY Right    MULTIPLE TOOTH EXTRACTIONS     POLYPECTOMY  03/15/2016   Procedure: POLYPECTOMY;  Surgeon: Danie Binder, MD;  Location[facility];  Service: Endoscopy;;  rectal polyp   THUMB AMPUTATION     Partial left thumb    Social History   Tobacco Use   Smoking status: Every Day    Packs/day: 2.00    Years: 40.00    Total pack years: 80.00    Types: Cigarettes   Smokeless tobacco: Never   Tobacco comments:    Now smoking one pack a day 06/02/21  Vaping Use   Vaping Use: Never used  Substance Use Topics   Alcohol use: Yes    Comment: PATIENT REPORT 6 PACK OF BEER a day, last use x 2 days ago on 07/21/21   Drug use: No   Family History  Problem Relation Age of Onset   COPD Mother  Colon cancer Neg Hx    Allergies  Allergen Reactions   Penicillins Rash    Has patient had a PCN reaction causing immediate rash, facial/tongue/throat swelling, SOB or lightheadedness with hypotension: Yes Has patient had a PCN reaction causing severe rash involving mucus membranes or skin necrosis: No Has patient had a PCN reaction that required hospitalization No Has patient had a PCN reaction occurring within the last 10 years: No If all of the above answers are "NO", then may proceed with Cephalosporin use.     Review of Systems  Constitutional:  Negative for chills and fever.  HENT:  Negative for sore throat.   Respiratory:  Negative for cough and shortness of breath.   Cardiovascular:  Negative for  chest pain, palpitations and leg swelling.  Gastrointestinal:  Negative for abdominal pain, blood in stool, constipation, diarrhea, nausea and vomiting.  Genitourinary:  Negative for dysuria and hematuria.  Musculoskeletal:  Negative for myalgias.  Skin:  Negative for itching and rash.  Neurological:  Negative for dizziness and headaches.  Psychiatric/Behavioral:  Negative for depression and suicidal ideas. The patient is nervous/anxious.      Objective:     BP 138/78 (BP Location: Right Arm, Cuff Size: Normal)   Pulse 89   Ht '5\' 7"'  (1.702 m)   Wt 140 lb 3.2 oz (63.6 kg)   SpO2 97%   BMI 21.96 kg/m  BP Readings from Last 3 Encounters:  12/02/21 138/78  11/02/21 (!) 142/88  08/05/21 132/74   Physical Exam Vitals reviewed.  Constitutional:      General: He is not in acute distress.    Appearance: Normal appearance. He is not ill-appearing.  HENT:     Head: Normocephalic and atraumatic.     Right Ear: External ear normal.     Left Ear: External ear normal.     Nose: Nose normal. No congestion or rhinorrhea.     Mouth/Throat:     Mouth: Mucous membranes are moist.     Pharynx: Oropharynx is clear.  Eyes:     Extraocular Movements: Extraocular movements intact.     Conjunctiva/sclera: Conjunctivae normal.     Pupils: Pupils are equal, round, and reactive to light.  Cardiovascular:     Rate and Rhythm: Normal rate and regular rhythm.     Pulses: Normal pulses.     Heart sounds: Normal heart sounds. No murmur heard. Pulmonary:     Effort: Pulmonary effort is normal.     Breath sounds: Normal breath sounds. No wheezing, rhonchi or rales.  Abdominal:     General: Abdomen is flat. Bowel sounds are normal. There is no distension.     Palpations: Abdomen is soft.     Tenderness: There is no abdominal tenderness.  Musculoskeletal:        General: No swelling or deformity. Normal range of motion.     Cervical back: Normal range of motion.  Skin:    General: Skin is warm and  dry.     Capillary Refill: Capillary refill takes less than 2 seconds.  Neurological:     General: No focal deficit present.     Mental Status: He is alert and oriented to person, place, and time.     Motor: No weakness.  Psychiatric:     Comments: Anxious affect    Last CBC Lab Results  Component Value Date   WBC 5.9 06/02/2021   HGB 13.1 06/02/2021   HCT 37.4 (L) 06/02/2021   MCV 97 06/02/2021  MCH 34.0 (H) 06/02/2021   RDW 12.9 06/02/2021   PLT 336 80/22/3361   Last metabolic panel Lab Results  Component Value Date   GLUCOSE 81 11/02/2021   NA 134 11/02/2021   K 4.4 11/02/2021   CL 97 11/02/2021   CO2 21 11/02/2021   BUN 9 11/02/2021   CREATININE 0.81 11/02/2021   EGFR 105 11/02/2021   CALCIUM 9.4 11/02/2021   PROT 7.3 11/02/2021   ALBUMIN 4.7 11/02/2021   LABGLOB 2.6 11/02/2021   AGRATIO 1.8 11/02/2021   BILITOT 0.4 11/02/2021   ALKPHOS 60 11/02/2021   AST 20 11/02/2021   ALT 11 11/02/2021   ANIONGAP 13 10/26/2017   Last lipids Lab Results  Component Value Date   CHOL 196 01/28/2021   HDL 85 01/28/2021   LDLCALC 94 01/28/2021   TRIG 98 01/28/2021   Last hemoglobin A1c Lab Results  Component Value Date   HGBA1C 5.3 01/14/2020   Last thyroid functions Lab Results  Component Value Date   TSH 0.793 01/28/2021   Last vitamin B12 and Folate Lab Results  Component Value Date   VITAMINB12 325 06/15/2021   FOLATE 8.4 06/15/2021     Assessment & Plan:   Problem List Items Addressed This Visit       Hypertension, essential - Primary    BP 149/89 initially, improved to 138/78 on repeat.  He is currently prescribed amlodipine 10 mg daily.  However, today he took amlodipine-olmesartan 5-40 mg because he was concerned that his blood pressure would be elevated.  He does not currently check his blood pressure at home.  He did not understand why amlodipine-olmesartan was discontinued.  I explained to him that it was because of hyperkalemia. -No medication  changes today.  I have asked Shane Allison to take amlodipine 10 mg daily as previously prescribed over the next 2 weeks.  He will also check his blood pressure daily and record readings in a BP log.  He will bring these readings to his next appointment.  If his blood pressure remains above goal, we will consider further medication changes.  He is rather anxious today and I suspect that this is contributing to his elevated blood pressure readings.       Return in about 2 weeks (around 12/16/2021).    Johnette Abraham, MD

## 2021-12-02 NOTE — Patient Instructions (Signed)
It was a pleasure to see you today.  Thank you for giving [place] the opportunity to be involved in your care.  Below is a brief recap of your visit and next steps.  We will plan to see you again in 2 weeks.  Summary No medication changes today. I recommend you take amlodipine 10 mg daily as prescribed and check your blood pressure each day for the next two weeks. Bring a log to your next appointment. If your blood pressure remains elevated then we will need to make adjustments.

## 2021-12-13 ENCOUNTER — Other Ambulatory Visit: Payer: Self-pay | Admitting: Nurse Practitioner

## 2021-12-13 DIAGNOSIS — I10 Essential (primary) hypertension: Secondary | ICD-10-CM

## 2021-12-16 ENCOUNTER — Encounter: Payer: Self-pay | Admitting: Internal Medicine

## 2021-12-16 ENCOUNTER — Ambulatory Visit: Payer: Medicaid Other | Admitting: Internal Medicine

## 2021-12-16 VITALS — BP 138/82 | HR 65 | Ht 67.0 in | Wt 142.2 lb

## 2021-12-16 DIAGNOSIS — I10 Essential (primary) hypertension: Secondary | ICD-10-CM

## 2021-12-16 MED ORDER — AMLODIPINE-OLMESARTAN 10-20 MG PO TABS: 1.0000 | ORAL_TABLET | Freq: Every day | ORAL | 2 refills | Status: DC

## 2021-12-16 NOTE — Assessment & Plan Note (Signed)
BP 138/82 today.  He is currently prescribed amlodipine 10 mg daily.  Goal BP is less than 130/80.  He was previously prescribed amlodipine-olmesartan 5-40 mg daily but this was discontinued due to hyperkalemia. -I discussed with Mr. Pauwels that his blood pressure remains above goal today and he needs additional antihypertensive treatment.  Medication options were reviewed.  He is in agreement with starting amlodipine-olmesartan 10-20 mg daily.  Of note, he had previously been on this medication for over 1 year without electrolyte issues. -Follow up in 4 weeks for BP check and repeat labs

## 2021-12-16 NOTE — Progress Notes (Signed)
Established Patient Office Visit  Subjective   Patient ID: Shane Allison, male    DOB: 01-25-1966  Age: 56 y.o. MRN: [id number]  Chief Complaint  Patient presents with   Follow-up   Shane Allison returns to care today.  He is a 56 year old male with a past medical history significant for hypertension, COPD, and alcohol abuse.  He was last seen by me on 10/12 for HTN follow-up.  His blood pressure was elevated at that time.  He was currently prescribed amlodipine 10 mg daily, however he had taken 1 tablet of amlodipine-olmesartan 5-40 mg because he was concerned his blood pressure was elevated.  He had previously been prescribed amlodipine-olmesartan 5-40 mg daily but this was not continued due to hyperkalemia.  2-week follow-up was arranged and in the interim I asked him to take amlodipine 10 mg daily as prescribed and to check his blood pressure at home and keep a log.  Today Shane Allison states that he feels well.  He is asymptomatic and has no acute concerns.  He has been taking amlodipine 10 mg daily as prescribed and has been checking his blood pressure at home.  He brings a log with him to review today.  The majority of his systolic blood pressure readings are in the 140s.  He does have two readings in the 110s.  Past Medical History:  Diagnosis Date   Abnormal result of iron profile testing 03/03/2016   AKI (acute kidney injury) ([place]) 05/10/2015   Alcohol use    Asthma    Cervical radiculopathy    Cervical spondylosis without myelopathy 05/14/2013   Chronic neck pain    COPD (chronic obstructive pulmonary disease) (HCC)    GERD (gastroesophageal reflux disease)    GI bleed 05/10/2015   Hypertension    Joint ache    Right AC joint separation   Numbness and tingling in hands 05/14/2013   Partial tear of rotator cuff 04/02/2013   Pneumonia    Psoriasis    Radicular pain in right arm 05/14/2013   Shortness of breath dyspnea    Past Surgical History:  Procedure Laterality Date    ACROMIO-CLAVICULAR JOINT REPAIR Right 10/26/2017   Procedure: RIGHT ACROMIO-CLAVICULAR JOINT RECONSTRUCTION;  Surgeon: Meredith Pel, MD;  Location: [facility];  Service: Orthopedics;  Laterality: Right;   BIOPSY  05/12/2015   Procedure: BIOPSY;  Surgeon: Danie Binder, MD;  [facility];  Service: Endoscopy;;  gastric biopsy   BIOPSY  03/15/2016   Procedure: BIOPSY;  Surgeon: Danie Binder, MD;  [facility];  Service: Endoscopy;;  random colon bx's   COLONOSCOPY WITH PROPOFOL N/A 03/15/2016   Procedure: COLONOSCOPY WITH PROPOFOL;  Surgeon: Danie Binder, MD;  [facility];  Service: Endoscopy;  Laterality: N/A;  12:30 PM   ESOPHAGOGASTRODUODENOSCOPY (EGD) WITH PROPOFOL N/A 05/12/2015   Dr. Oneida Alar: normal esophagus, gastritis, bleeding friable duodenal mucosa   HAND TENDON SURGERY Right    MULTIPLE TOOTH EXTRACTIONS     POLYPECTOMY  03/15/2016   Procedure: POLYPECTOMY;  Surgeon: Danie Binder, MD;  [facility];  Service: Endoscopy;;  rectal polyp   THUMB AMPUTATION     Partial left thumb    Social History   Tobacco Use   Smoking status: Every Day    Packs/day: 2.00    Years: 40.00    Total pack years: 80.00    Types: Cigarettes   Smokeless tobacco: Never   Tobacco comments:  Now smoking one pack a day 06/02/21  Vaping Use   Vaping Use: Never used  Substance Use Topics   Alcohol use: Yes    Comment: PATIENT REPORT 6 PACK OF BEER a day, last use x 2 days ago on 07/21/21   Drug use: No   Family History  Problem Relation Age of Onset   COPD Mother    Colon cancer Neg Hx    Allergies  Allergen Reactions   Penicillins Rash    Has patient had a PCN reaction causing immediate rash, facial/tongue/throat swelling, SOB or lightheadedness with hypotension: Yes Has patient had a PCN reaction causing severe rash involving mucus membranes or skin necrosis: No Has patient had a PCN reaction that required hospitalization No Has patient had a  PCN reaction occurring within the last 10 years: No If all of the above answers are "NO", then may proceed with Cephalosporin use.     Review of Systems  Constitutional:  Negative for chills and fever.  HENT:  Negative for sore throat.   Respiratory:  Negative for cough and shortness of breath.   Cardiovascular:  Negative for chest pain, palpitations and leg swelling.  Gastrointestinal:  Negative for abdominal pain, blood in stool, constipation, diarrhea, nausea and vomiting.  Genitourinary:  Negative for dysuria and hematuria.  Musculoskeletal:  Negative for myalgias.  Skin:  Negative for itching and rash.  Neurological:  Negative for dizziness and headaches.  Psychiatric/Behavioral:  Negative for depression and suicidal ideas.      Objective:     BP 138/82   Pulse 65   Ht '5\' 7"'  (1.702 m)   Wt 142 lb 3.2 oz (64.5 kg)   SpO2 96%   BMI 22.27 kg/m  BP Readings from Last 3 Encounters:  12/16/21 138/82  12/02/21 138/78  11/02/21 (!) 142/88   Physical Exam Vitals reviewed.  Constitutional:      General: He is not in acute distress.    Appearance: Normal appearance. He is not ill-appearing.  HENT:     Head: Normocephalic and atraumatic.     Nose: Nose normal. No congestion or rhinorrhea.     Mouth/Throat:     Mouth: Mucous membranes are moist.     Pharynx: Oropharynx is clear.  Eyes:     Extraocular Movements: Extraocular movements intact.     Conjunctiva/sclera: Conjunctivae normal.     Pupils: Pupils are equal, round, and reactive to light.  Cardiovascular:     Rate and Rhythm: Normal rate and regular rhythm.     Pulses: Normal pulses.     Heart sounds: Normal heart sounds. No murmur heard. Pulmonary:     Effort: Pulmonary effort is normal.     Breath sounds: Normal breath sounds. No wheezing, rhonchi or rales.  Abdominal:     General: Abdomen is flat. Bowel sounds are normal. There is no distension.     Palpations: Abdomen is soft.     Tenderness: There is no  abdominal tenderness.  Musculoskeletal:        General: No swelling or deformity. Normal range of motion.     Cervical back: Normal range of motion.  Skin:    General: Skin is warm and dry.     Capillary Refill: Capillary refill takes less than 2 seconds.  Neurological:     General: No focal deficit present.     Mental Status: He is alert and oriented to person, place, and time.     Motor: No weakness.  Psychiatric:  Mood and Affect: Mood normal.        Behavior: Behavior normal.        Thought Content: Thought content normal.    Last CBC Lab Results  Component Value Date   WBC 5.9 06/02/2021   HGB 13.1 06/02/2021   HCT 37.4 (L) 06/02/2021   MCV 97 06/02/2021   MCH 34.0 (H) 06/02/2021   RDW 12.9 06/02/2021   PLT 336 28/83/3744   Last metabolic panel Lab Results  Component Value Date   GLUCOSE 81 11/02/2021   NA 134 11/02/2021   K 4.4 11/02/2021   CL 97 11/02/2021   CO2 21 11/02/2021   BUN 9 11/02/2021   CREATININE 0.81 11/02/2021   EGFR 105 11/02/2021   CALCIUM 9.4 11/02/2021   PROT 7.3 11/02/2021   ALBUMIN 4.7 11/02/2021   LABGLOB 2.6 11/02/2021   AGRATIO 1.8 11/02/2021   BILITOT 0.4 11/02/2021   ALKPHOS 60 11/02/2021   AST 20 11/02/2021   ALT 11 11/02/2021   ANIONGAP 13 10/26/2017   Last lipids Lab Results  Component Value Date   CHOL 196 01/28/2021   HDL 85 01/28/2021   LDLCALC 94 01/28/2021   TRIG 98 01/28/2021   Last hemoglobin A1c Lab Results  Component Value Date   HGBA1C 5.3 01/14/2020   Last thyroid functions Lab Results  Component Value Date   TSH 0.793 01/28/2021   Last vitamin B12 and Folate Lab Results  Component Value Date   VITAMINB12 325 06/15/2021   FOLATE 8.4 06/15/2021   The 10-year ASCVD risk score (Arnett DK, et al., 2019) is: 7.9%    Assessment & Plan:   Problem List Items Addressed This Visit       Hypertension, essential - Primary    BP 138/82 today.  He is currently prescribed amlodipine 10 mg daily.   Goal BP is less than 130/80.  He was previously prescribed amlodipine-olmesartan 5-40 mg daily but this was discontinued due to hyperkalemia. -I discussed with Shane Allison that his blood pressure remains above goal today and he needs additional antihypertensive treatment.  Medication options were reviewed.  He is in agreement with starting amlodipine-olmesartan 10-20 mg daily.  Of note, he had previously been on this medication for over 1 year without electrolyte issues. -Follow up in 4 weeks for BP check and repeat labs      Relevant Medications   amlodipine-olmesartan (AZOR) 10-20 MG tablet    Return in about 4 weeks (around 01/13/2022).    Johnette Abraham, MD

## 2021-12-16 NOTE — Patient Instructions (Signed)
It was a pleasure to see you today.  Thank you for giving [place] the opportunity to be involved in your care.  Below is a brief recap of your visit and next steps.  We will plan to see you again in 4 weeks.  Summary I have prescribed amlodipine-olmesartan 10-20 mg daily today Follow up in 4 weeks for BP check Stop amlodipine 10 mg daily

## 2022-01-10 ENCOUNTER — Ambulatory Visit: Payer: Medicaid Other | Admitting: Internal Medicine

## 2022-01-10 ENCOUNTER — Encounter: Payer: Self-pay | Admitting: Internal Medicine

## 2022-01-10 VITALS — BP 127/84 | HR 77 | Ht 67.0 in | Wt 142.2 lb

## 2022-01-10 DIAGNOSIS — I10 Essential (primary) hypertension: Secondary | ICD-10-CM | POA: Diagnosis not present

## 2022-01-10 DIAGNOSIS — E875 Hyperkalemia: Secondary | ICD-10-CM

## 2022-01-10 NOTE — Progress Notes (Signed)
Established Patient Office Visit  Subjective   Patient ID: Shane Allison, male    DOB: August 05, 1965  Age: 56 y.o. MRN: [id number]  Chief Complaint  Patient presents with   Follow-up   Shane Allison returns to care today for HTN follow up.  He was last seen by me on 10/26 for HTN follow-up.  His blood pressure remained above goal at that time, 138/82.  He was switched from amlodipine 10 mg daily to amlodipine-olmesartan 10-20 mg daily.  4-week follow-up was arranged for BP check.  There have been no acute interval events.  Today Shane Allison states that he feels well.  He is asymptomatic and has no acute concerns to discuss.  He has been checking his blood pressure regularly at home and brings a log to review today.  Past Medical History:  Diagnosis Date   Abnormal result of iron profile testing 03/03/2016   AKI (acute kidney injury) ([place]) 05/10/2015   Alcohol use    Asthma    Cervical radiculopathy    Cervical spondylosis without myelopathy 05/14/2013   Chronic neck pain    COPD (chronic obstructive pulmonary disease) (HCC)    GERD (gastroesophageal reflux disease)    GI bleed 05/10/2015   Hypertension    Joint ache    Right AC joint separation   Numbness and tingling in hands 05/14/2013   Partial tear of rotator cuff 04/02/2013   Pneumonia    Psoriasis    Radicular pain in right arm 05/14/2013   Shortness of breath dyspnea    Past Surgical History:  Procedure Laterality Date   ACROMIO-CLAVICULAR JOINT REPAIR Right 10/26/2017   Procedure: RIGHT ACROMIO-CLAVICULAR JOINT RECONSTRUCTION;  Surgeon: Meredith Pel, MD;  Location: [facility];  Service: Orthopedics;  Laterality: Right;   BIOPSY  05/12/2015   Procedure: BIOPSY;  Surgeon: Danie Binder, MD;  [facility];  Service: Endoscopy;;  gastric biopsy   BIOPSY  03/15/2016   Procedure: BIOPSY;  Surgeon: Danie Binder, MD;  [facility];  Service: Endoscopy;;  random colon bx's   COLONOSCOPY WITH PROPOFOL N/A  03/15/2016   Procedure: COLONOSCOPY WITH PROPOFOL;  Surgeon: Danie Binder, MD;  [facility];  Service: Endoscopy;  Laterality: N/A;  12:30 PM   ESOPHAGOGASTRODUODENOSCOPY (EGD) WITH PROPOFOL N/A 05/12/2015   Dr. Oneida Alar: normal esophagus, gastritis, bleeding friable duodenal mucosa   HAND TENDON SURGERY Right    MULTIPLE TOOTH EXTRACTIONS     POLYPECTOMY  03/15/2016   Procedure: POLYPECTOMY;  Surgeon: Danie Binder, MD;  [facility];  Service: Endoscopy;;  rectal polyp   THUMB AMPUTATION     Partial left thumb    Social History   Tobacco Use   Smoking status: Every Day    Packs/day: 2.00    Years: 40.00    Total pack years: 80.00    Types: Cigarettes   Smokeless tobacco: Never   Tobacco comments:    Now smoking one pack a day 06/02/21  Vaping Use   Vaping Use: Never used  Substance Use Topics   Alcohol use: Yes    Comment: PATIENT REPORT 6 PACK OF BEER a day, last use x 2 days ago on 07/21/21   Drug use: No   Family History  Problem Relation Age of Onset   COPD Mother    Colon cancer Neg Hx    Allergies  Allergen Reactions   Penicillins Rash    Has patient had a PCN  reaction causing immediate rash, facial/tongue/throat swelling, SOB or lightheadedness with hypotension: Yes Has patient had a PCN reaction causing severe rash involving mucus membranes or skin necrosis: No Has patient had a PCN reaction that required hospitalization No Has patient had a PCN reaction occurring within the last 10 years: No If all of the above answers are "NO", then may proceed with Cephalosporin use.     Review of Systems  Constitutional:  Negative for chills and fever.  HENT:  Negative for sore throat.   Respiratory:  Negative for cough and shortness of breath.   Cardiovascular:  Negative for chest pain, palpitations and leg swelling.  Gastrointestinal:  Negative for abdominal pain, blood in stool, constipation, diarrhea, nausea and vomiting.  Genitourinary:   Negative for dysuria and hematuria.  Musculoskeletal:  Negative for myalgias.  Skin:  Negative for itching and rash.  Neurological:  Negative for dizziness and headaches.  Psychiatric/Behavioral:  Negative for depression and suicidal ideas.      Objective:     BP 127/84   Pulse 77   Ht _0  (1.702 m)   Wt 142 lb 3.2 oz (64.5 kg)   SpO2 97%   BMI 22.27 kg/m  BP Readings from Last 3 Encounters:  01/10/22 127/84  12/16/21 138/82  12/02/21 138/78   Physical Exam Vitals reviewed.  Constitutional:      General: He is not in acute distress.    Appearance: Normal appearance. He is not ill-appearing.  HENT:     Head: Normocephalic and atraumatic.     Nose: Nose normal. No congestion or rhinorrhea.     Mouth/Throat:     Mouth: Mucous membranes are moist.     Pharynx: Oropharynx is clear.  Eyes:     Extraocular Movements: Extraocular movements intact.     Conjunctiva/sclera: Conjunctivae normal.     Pupils: Pupils are equal, round, and reactive to light.  Cardiovascular:     Rate and Rhythm: Normal rate and regular rhythm.     Pulses: Normal pulses.     Heart sounds: Normal heart sounds. No murmur heard. Pulmonary:     Effort: Pulmonary effort is normal.     Breath sounds: Normal breath sounds. No wheezing, rhonchi or rales.  Abdominal:     General: Abdomen is flat. Bowel sounds are normal. There is no distension.     Palpations: Abdomen is soft.     Tenderness: There is no abdominal tenderness.  Musculoskeletal:        General: No swelling or deformity. Normal range of motion.     Cervical back: Normal range of motion.  Skin:    General: Skin is warm and dry.     Capillary Refill: Capillary refill takes less than 2 seconds.  Neurological:     General: No focal deficit present.     Mental Status: He is alert and oriented to person, place, and time.     Motor: No weakness.  Psychiatric:        Mood and Affect: Mood normal.        Behavior: Behavior normal.         Thought Content: Thought content normal.    Last CBC Lab Results  Component Value Date   WBC 5.9 06/02/2021   HGB 13.1 06/02/2021   HCT 37.4 (L) 06/02/2021   MCV 97 06/02/2021   MCH 34.0 (H) 06/02/2021   RDW 12.9 06/02/2021   PLT 336 01/74/9449   Last metabolic panel Lab Results  Component Value  Date   GLUCOSE 81 11/02/2021   NA 134 11/02/2021   K 4.4 11/02/2021   CL 97 11/02/2021   CO2 21 11/02/2021   BUN 9 11/02/2021   CREATININE 0.81 11/02/2021   EGFR 105 11/02/2021   CALCIUM 9.4 11/02/2021   PROT 7.3 11/02/2021   ALBUMIN 4.7 11/02/2021   LABGLOB 2.6 11/02/2021   AGRATIO 1.8 11/02/2021   BILITOT 0.4 11/02/2021   ALKPHOS 60 11/02/2021   AST 20 11/02/2021   ALT 11 11/02/2021   ANIONGAP 13 10/26/2017   Last lipids Lab Results  Component Value Date   CHOL 196 01/28/2021   HDL 85 01/28/2021   LDLCALC 94 01/28/2021   TRIG 98 01/28/2021   Last hemoglobin A1c Lab Results  Component Value Date   HGBA1C 5.3 01/14/2020   Last thyroid functions Lab Results  Component Value Date   TSH 0.793 01/28/2021   Last vitamin B12 and Folate Lab Results  Component Value Date   VITAMINB12 325 06/15/2021   FOLATE 8.4 06/15/2021   The 10-year ASCVD risk score (Arnett DK, et al., 2019) is: 6.8%    Assessment & Plan:   Problem List Items Addressed This Visit       Hypertension, essential - Primary    Amlodipine-olmesartan 10-20 mg daily was started at his last appointment.  His blood pressure today is 127/84.  He has been checking his blood pressure at home since his last appointment and his readings are consistently within goal. -No medication changes today -BMP has been ordered to monitor electrolytes and renal function -Follow-up in 3 months       Return in about 3 months (around 04/12/2022).    Johnette Abraham, MD

## 2022-01-10 NOTE — Patient Instructions (Signed)
It was a pleasure to see you today.  Thank you for giving [place] the opportunity to be involved in your care.  Below is a brief recap of your visit and next steps.  We will plan to see you again in 3 months.  Summary No medication changes today. Your blood pressure looks great. We will check labs to make sure your potassium level and kidney function are ok. Follow up in 3 months

## 2022-01-10 NOTE — Assessment & Plan Note (Signed)
Amlodipine-olmesartan 10-20 mg daily was started at his last appointment.  His blood pressure today is 127/84.  He has been checking his blood pressure at home since his last appointment and his readings are consistently within goal. -No medication changes today -BMP has been ordered to monitor electrolytes and renal function -Follow-up in 3 months

## 2022-01-11 LAB — BASIC METABOLIC PANEL
BUN/Creatinine Ratio: 12 (ref 9–20)
BUN: 10 mg/dL (ref 6–24)
CO2: 21 mmol/L (ref 20–29)
Calcium: 9.1 mg/dL (ref 8.7–10.2)
Chloride: 92 mmol/L — ABNORMAL LOW (ref 96–106)
Creatinine, Ser: 0.82 mg/dL (ref 0.76–1.27)
Glucose: 111 mg/dL — ABNORMAL HIGH (ref 70–99)
Potassium: 4.6 mmol/L (ref 3.5–5.2)
Sodium: 129 mmol/L — ABNORMAL LOW (ref 134–144)
eGFR: 104 mL/min/{1.73_m2} (ref >59)

## 2022-03-04 ENCOUNTER — Ambulatory Visit (INDEPENDENT_AMBULATORY_CARE_PROVIDER_SITE_OTHER): Payer: Medicaid Other | Admitting: Internal Medicine

## 2022-03-04 ENCOUNTER — Encounter: Payer: Self-pay | Admitting: Internal Medicine

## 2022-03-04 VITALS — BP 116/80 | HR 60 | Ht 67.0 in | Wt 143.4 lb

## 2022-03-04 DIAGNOSIS — J449 Chronic obstructive pulmonary disease, unspecified: Secondary | ICD-10-CM | POA: Diagnosis not present

## 2022-03-04 DIAGNOSIS — Z1321 Encounter for screening for nutritional disorder: Secondary | ICD-10-CM

## 2022-03-04 DIAGNOSIS — Z1329 Encounter for screening for other suspected endocrine disorder: Secondary | ICD-10-CM

## 2022-03-04 DIAGNOSIS — Z13220 Encounter for screening for lipoid disorders: Secondary | ICD-10-CM | POA: Diagnosis not present

## 2022-03-04 DIAGNOSIS — E871 Hypo-osmolality and hyponatremia: Secondary | ICD-10-CM

## 2022-03-04 DIAGNOSIS — Z0001 Encounter for general adult medical examination with abnormal findings: Secondary | ICD-10-CM | POA: Diagnosis not present

## 2022-03-04 DIAGNOSIS — I10 Essential (primary) hypertension: Secondary | ICD-10-CM | POA: Diagnosis not present

## 2022-03-04 DIAGNOSIS — Z131 Encounter for screening for diabetes mellitus: Secondary | ICD-10-CM | POA: Diagnosis not present

## 2022-03-04 DIAGNOSIS — Z2821 Immunization not carried out because of patient refusal: Secondary | ICD-10-CM | POA: Diagnosis not present

## 2022-03-04 MED ORDER — STIOLTO RESPIMAT 2.5-2.5 MCG/ACT IN AERS: 2.0000 | INHALATION_SPRAY | Freq: Every day | RESPIRATORY_TRACT | 3 refills | Status: DC

## 2022-03-04 MED ORDER — PREDNISONE 20 MG PO TABS: 40.0000 mg | ORAL_TABLET | Freq: Every day | ORAL | 0 refills | Status: AC

## 2022-03-04 NOTE — Assessment & Plan Note (Addendum)
Presenting today for his annual exam.  Previous records and labs been reviewed. -Repeat labs been ordered today -Outstanding vaccines have been declined -Cancer screenings are up-to-date -He has previously scheduled follow-up with me next month (2/20) -Treating COPD exacerbation as documented elsewhere.

## 2022-03-04 NOTE — Assessment & Plan Note (Signed)
Currently prescribed Stiolto for daily use.  He has an albuterol inhaler for as needed use.  He endorses recent shortness of breath following a viral respiratory infection.  He is also currently out of [place].  He has needed to use his albuterol inhaler more frequently as of late.  Denies cough or sputum production currently. -I have prescribed prednisone 40 mg daily x 5 days for treatment of COPD exacerbation -Stiolto has been refilled

## 2022-03-04 NOTE — Progress Notes (Signed)
Complete physical exam  Patient: Shane Allison   DOB: 02-20-1966   57 y.o. Male  MRN: [id number]  Subjective:    Chief Complaint  Patient presents with   Annual Exam    Patient said since christmas he has not been able to breathe very good, feels like he's wheezing and not being able to cough anything up   Shane Allison is a 57 y.o. male who presents today for a complete physical exam. He reports consuming a general diet. The patient does not participate in regular exercise at present. He generally feels well. He reports sleeping fairly well. He does have additional problems to discuss today. Today he endorses shortness of breath associated with wheezing and a dry cough. He had a viral upper respiratory infection over the holidays and is currently out of Stiolto.   Most recent fall risk assessment:    01/10/2022   10:18 AM  Fall Risk   Falls in the past year? 0  Number falls in past yr: 0  Injury with Fall? 0  Risk for fall due to : No Fall Risks  Follow up Falls evaluation completed     Most recent depression screenings:    01/10/2022   10:18 AM 12/16/2021    9:21 AM  PHQ 2/9 Scores  PHQ - 2 Score 0 0    Vision:Not within last year  and Dental: Current dental problems and No regular dental care   Past Medical History:  Diagnosis Date   Abnormal result of iron profile testing 03/03/2016   AKI (acute kidney injury) ([place]) 05/10/2015   Alcohol use    Asthma    Cervical radiculopathy    Cervical spondylosis without myelopathy 05/14/2013   Chronic neck pain    COPD (chronic obstructive pulmonary disease) ([facility])    GERD (gastroesophageal reflux disease)    GI bleed 05/10/2015   Hypertension    Joint ache    Right AC joint separation   Numbness and tingling in hands 05/14/2013   Partial tear of rotator cuff 04/02/2013   Pneumonia    Psoriasis    Radicular pain in right arm 05/14/2013   Shortness of breath dyspnea    Past Surgical History:  Procedure Laterality  Date   ACROMIO-CLAVICULAR JOINT REPAIR Right 10/26/2017   Procedure: RIGHT ACROMIO-CLAVICULAR JOINT RECONSTRUCTION;  Surgeon: Meredith Pel, MD;  Location: [facility];  Service: Orthopedics;  Laterality: Right;   BIOPSY  05/12/2015   Procedure: BIOPSY;  Surgeon: Danie Binder, MD;  [facility];  Service: Endoscopy;;  gastric biopsy   BIOPSY  03/15/2016   Procedure: BIOPSY;  Surgeon: Danie Binder, MD;  [facility];  Service: Endoscopy;;  random colon bx's   COLONOSCOPY WITH PROPOFOL N/A 03/15/2016   Procedure: COLONOSCOPY WITH PROPOFOL;  Surgeon: Danie Binder, MD;  [facility];  Service: Endoscopy;  Laterality: N/A;  12:30 PM   ESOPHAGOGASTRODUODENOSCOPY (EGD) WITH PROPOFOL N/A 05/12/2015   Dr. Oneida Alar: normal esophagus, gastritis, bleeding friable duodenal mucosa   HAND TENDON SURGERY Right    MULTIPLE TOOTH EXTRACTIONS     POLYPECTOMY  03/15/2016   Procedure: POLYPECTOMY;  Surgeon: Danie Binder, MD;  [facility];  Service: Endoscopy;;  rectal polyp   THUMB AMPUTATION     Partial left thumb    Social History   Tobacco Use   Smoking status: Every Day    Packs/day: 2.00    Years: 40.00  Total pack years: 80.00    Types: Cigarettes   Smokeless tobacco: Never   Tobacco comments:    Now smoking one pack a day 06/02/21  Vaping Use   Vaping Use: Never used  Substance Use Topics   Alcohol use: Yes    Comment: PATIENT REPORT 6 PACK OF BEER a day, last use x 2 days ago on 07/21/21   Drug use: No   Family History  Problem Relation Age of Onset   COPD Mother    Colon cancer Neg Hx    Allergies  Allergen Reactions   Penicillins Rash    Has patient had a PCN reaction causing immediate rash, facial/tongue/throat swelling, SOB or lightheadedness with hypotension: Yes Has patient had a PCN reaction causing severe rash involving mucus membranes or skin necrosis: No Has patient had a PCN reaction that required hospitalization No Has patient  had a PCN reaction occurring within the last 10 years: No If all of the above answers are "NO", then may proceed with Cephalosporin use.     Patient Care Team: Johnette Abraham, MD as PCP - General (Internal Medicine)   Outpatient Medications Prior to Visit  Medication Sig   cetirizine (ZYRTEC) 10 MG tablet Take 1 tablet by mouth daily.   [DISCONTINUED] amLODipine (NORVASC) 10 MG tablet Take 1 tablet by mouth daily.   acetaminophen (TYLENOL) 500 MG tablet Take 500 mg by mouth every 6 (six) hours as needed. Takes as needed   amlodipine-olmesartan (AZOR) 10-20 MG tablet Take 1 tablet by mouth daily.   TALTZ 80 MG/ML SOAJ Inject 80 mg into the skin every 30 (thirty) days.  (Patient not taking: Reported on 01/10/2022)   VENTOLIN HFA 108 (90 Base) MCG/ACT inhaler Inhale 2 puffs into the lungs every 6 (six) hours as needed for wheezing or shortness of breath.   [DISCONTINUED] lisinopril (ZESTRIL) 10 MG tablet Take 1 tablet by mouth daily.   [DISCONTINUED] STIOLTO RESPIMAT 2.5-2.5 MCG/ACT AERS INHALE 2 PUFFS INTO THE LUNGS DAILY   [DISCONTINUED] ondansetron (ZOFRAN) 4 mg in sodium chloride 0.9 % 50 mL IVPB    No facility-administered medications prior to visit.   Review of Systems  Constitutional:  Negative for chills and fever.  Respiratory:  Positive for cough, shortness of breath and wheezing. Negative for sputum production.   All other systems reviewed and are negative.     Objective:     BP 116/80 (BP Location: Right Arm, Patient Position: Sitting, Cuff Size: Normal)   Pulse 60   Ht '5\' 7"'$  (1.702 m)   Wt 143 lb 6.4 oz (65 kg)   SpO2 97%   BMI 22.46 kg/m  BP Readings from Last 3 Encounters:  03/04/22 116/80  01/10/22 127/84  12/16/21 138/82   Physical Exam Vitals reviewed.  Constitutional:      General: He is not in acute distress.    Appearance: Normal appearance. He is not ill-appearing.  HENT:     Head: Normocephalic and atraumatic.     Nose: Nose normal. No  congestion or rhinorrhea.     Mouth/Throat:     Mouth: Mucous membranes are moist.     Pharynx: Oropharynx is clear.  Eyes:     Extraocular Movements: Extraocular movements intact.     Conjunctiva/sclera: Conjunctivae normal.     Pupils: Pupils are equal, round, and reactive to light.  Cardiovascular:     Rate and Rhythm: Normal rate and regular rhythm.     Pulses: Normal pulses.  Heart sounds: Normal heart sounds. No murmur heard. Pulmonary:     Effort: Pulmonary effort is normal.     Breath sounds: Wheezing (diffuse, bilateral) present. No rhonchi or rales.  Abdominal:     General: Abdomen is flat. Bowel sounds are normal. There is no distension.     Palpations: Abdomen is soft.     Tenderness: There is no abdominal tenderness.  Musculoskeletal:        General: No swelling or deformity. Normal range of motion.     Cervical back: Normal range of motion.  Skin:    General: Skin is warm and dry.     Capillary Refill: Capillary refill takes less than 2 seconds.  Neurological:     General: No focal deficit present.     Mental Status: He is alert and oriented to person, place, and time.     Motor: No weakness.  Psychiatric:        Mood and Affect: Mood normal.        Behavior: Behavior normal.        Thought Content: Thought content normal.     Last CBC Lab Results  Component Value Date   WBC 5.9 06/02/2021   HGB 13.1 06/02/2021   HCT 37.4 (L) 06/02/2021   MCV 97 06/02/2021   MCH 34.0 (H) 06/02/2021   RDW 12.9 06/02/2021   PLT 336 10/62/6948   Last metabolic panel Lab Results  Component Value Date   GLUCOSE 111 (H) 01/10/2022   NA 129 (L) 01/10/2022   K 4.6 01/10/2022   CL 92 (L) 01/10/2022   CO2 21 01/10/2022   BUN 10 01/10/2022   CREATININE 0.82 01/10/2022   EGFR 104 01/10/2022   CALCIUM 9.1 01/10/2022   PROT 7.3 11/02/2021   ALBUMIN 4.7 11/02/2021   LABGLOB 2.6 11/02/2021   AGRATIO 1.8 11/02/2021   BILITOT 0.4 11/02/2021   ALKPHOS 60 11/02/2021   AST  20 11/02/2021   ALT 11 11/02/2021   ANIONGAP 13 10/26/2017   Last lipids Lab Results  Component Value Date   CHOL 196 01/28/2021   HDL 85 01/28/2021   LDLCALC 94 01/28/2021   TRIG 98 01/28/2021   Last hemoglobin A1c Lab Results  Component Value Date   HGBA1C 5.3 01/14/2020   Last thyroid functions Lab Results  Component Value Date   TSH 0.793 01/28/2021   Last vitamin B12 and Folate Lab Results  Component Value Date   VITAMINB12 325 06/15/2021   FOLATE 8.4 06/15/2021        Assessment & Plan:    Routine Health Maintenance and Physical Exam  Immunization History  Administered Date(s) Administered   PNEUMOCOCCAL CONJUGATE-20 01/28/2021   PPD Test 01/03/2015, 04/28/2016, 06/30/2020    Health Maintenance  Topic Date Due   DTaP/Tdap/Td (1 - Tdap) Never done   INFLUENZA VACCINE  05/22/2022 (Originally 09/21/2021)   Lung Cancer Screening  11/09/2022   COLONOSCOPY (Pts 45-4yr Insurance coverage will need to be confirmed)  03/15/2026   Hepatitis C Screening  Completed   HIV Screening  Completed   HPV VACCINES  Aged Out   COVID-19 Vaccine  Discontinued   Zoster Vaccines- Shingrix  Discontinued    Discussed health benefits of physical activity, and encouraged him to engage in regular exercise appropriate for his age and condition.  Problem List Items Addressed This Visit       COPD (chronic obstructive pulmonary disease) ([place] - Primary    Currently prescribed Stiolto for daily use.  He has an albuterol inhaler for as needed use.  He endorses recent shortness of breath following a viral respiratory infection.  He is also currently out of [place].  He has needed to use his albuterol inhaler more frequently as of late.  Denies cough or sputum production currently. -I have prescribed prednisone 40 mg daily x 5 days for treatment of COPD exacerbation -Stiolto has been refilled      Encounter for routine adult health examination with abnormal findings    Presenting  today for his annual exam.  Previous records and labs been reviewed. -Repeat labs been ordered today -Outstanding vaccines have been declined -Cancer screenings are up-to-date -He has previously scheduled follow-up with me next month (2/20) -Treating COPD exacerbation as documented elsewhere.      Return in about 6 weeks (around 04/12/2022).  Johnette Abraham, MD

## 2022-03-04 NOTE — Patient Instructions (Signed)
It was a pleasure to see you today.  Thank you for giving [place] the opportunity to be involved in your care.  Below is a brief recap of your visit and next steps.  We will plan to see you again in February  Summary I have prescribed prednisone 40 mg daily x 5 days for COPD Stiolto has been refilled Repeat labs ordered today We completed your annual exam We will follow up on 2/20

## 2022-03-06 LAB — CMP14+EGFR
ALT: 10 IU/L (ref 0–44)
AST: 13 IU/L (ref 0–40)
Albumin/Globulin Ratio: 1.1 — ABNORMAL LOW (ref 1.2–2.2)
Albumin: 3.8 g/dL (ref 3.8–4.9)
Alkaline Phosphatase: 86 IU/L (ref 44–121)
BUN/Creatinine Ratio: 12 (ref 9–20)
BUN: 9 mg/dL (ref 6–24)
Bilirubin Total: 0.3 mg/dL (ref 0.0–1.2)
CO2: 20 mmol/L (ref 20–29)
Calcium: 9.5 mg/dL (ref 8.7–10.2)
Chloride: 95 mmol/L — ABNORMAL LOW (ref 96–106)
Creatinine, Ser: 0.75 mg/dL — ABNORMAL LOW (ref 0.76–1.27)
Globulin, Total: 3.4 g/dL (ref 1.5–4.5)
Glucose: 82 mg/dL (ref 70–99)
Potassium: 4.9 mmol/L (ref 3.5–5.2)
Sodium: 131 mmol/L — ABNORMAL LOW (ref 134–144)
Total Protein: 7.2 g/dL (ref 6.0–8.5)
eGFR: 107 mL/min/{1.73_m2} (ref >59)

## 2022-03-06 LAB — LIPID PANEL
Chol/HDL Ratio: 2.3 ratio (ref 0.0–5.0)
Cholesterol, Total: 121 mg/dL (ref 100–199)
HDL: 52 mg/dL (ref >39)
LDL Chol Calc (NIH): 53 mg/dL (ref 0–99)
Triglycerides: 82 mg/dL (ref 0–149)
VLDL Cholesterol Cal: 16 mg/dL (ref 5–40)

## 2022-03-06 LAB — VITAMIN D 25 HYDROXY (VIT D DEFICIENCY, FRACTURES): Vit D, 25-Hydroxy: 24.5 ng/mL — ABNORMAL LOW (ref 30.0–100.0)

## 2022-03-06 LAB — CBC WITH DIFFERENTIAL/PLATELET
Basophils Absolute: 0.1 10*3/uL (ref 0.0–0.2)
Basos: 1 %
EOS (ABSOLUTE): 0.1 10*3/uL (ref 0.0–0.4)
Eos: 1 %
Hematocrit: 35.2 % — ABNORMAL LOW (ref 37.5–51.0)
Hemoglobin: 12 g/dL — ABNORMAL LOW (ref 13.0–17.7)
Immature Grans (Abs): 0 10*3/uL (ref 0.0–0.1)
Immature Granulocytes: 0 %
Lymphocytes Absolute: 2.1 10*3/uL (ref 0.7–3.1)
Lymphs: 26 %
MCH: 32.4 pg (ref 26.6–33.0)
MCHC: 34.1 g/dL (ref 31.5–35.7)
MCV: 95 fL (ref 79–97)
Monocytes Absolute: 0.7 10*3/uL (ref 0.1–0.9)
Monocytes: 8 %
Neutrophils Absolute: 5.2 10*3/uL (ref 1.4–7.0)
Neutrophils: 64 %
Platelets: 578 10*3/uL — ABNORMAL HIGH (ref 150–450)
RBC: 3.7 x10E6/uL — ABNORMAL LOW (ref 4.14–5.80)
RDW: 11.1 % — ABNORMAL LOW (ref 11.6–15.4)
WBC: 8.2 10*3/uL (ref 3.4–10.8)

## 2022-03-06 LAB — HEMOGLOBIN A1C
Est. average glucose Bld gHb Est-mCnc: 120 mg/dL
Hgb A1c MFr Bld: 5.8 % — ABNORMAL HIGH (ref 4.8–5.6)

## 2022-03-06 LAB — B12 AND FOLATE PANEL
Folate: 4.8 ng/mL (ref >3.0)
Vitamin B-12: 461 pg/mL (ref 232–1245)

## 2022-03-06 LAB — TSH+FREE T4
Free T4: 1.31 ng/dL (ref 0.82–1.77)
TSH: 0.951 u[IU]/mL (ref 0.450–4.500)

## 2022-03-15 ENCOUNTER — Other Ambulatory Visit: Payer: Self-pay | Admitting: Family Medicine

## 2022-04-11 ENCOUNTER — Other Ambulatory Visit: Payer: Self-pay | Admitting: Nurse Practitioner

## 2022-04-12 ENCOUNTER — Ambulatory Visit: Payer: Medicaid Other | Admitting: Internal Medicine

## 2022-04-12 ENCOUNTER — Encounter: Payer: Self-pay | Admitting: Internal Medicine

## 2022-04-12 VITALS — BP 136/84 | HR 100 | Ht 67.0 in | Wt 136.6 lb

## 2022-04-12 DIAGNOSIS — E559 Vitamin D deficiency, unspecified: Secondary | ICD-10-CM

## 2022-04-12 DIAGNOSIS — I10 Essential (primary) hypertension: Secondary | ICD-10-CM | POA: Diagnosis not present

## 2022-04-12 DIAGNOSIS — D75839 Thrombocytosis, unspecified: Secondary | ICD-10-CM | POA: Diagnosis not present

## 2022-04-12 DIAGNOSIS — J449 Chronic obstructive pulmonary disease, unspecified: Secondary | ICD-10-CM | POA: Diagnosis not present

## 2022-04-12 DIAGNOSIS — R7303 Prediabetes: Secondary | ICD-10-CM | POA: Diagnosis not present

## 2022-04-12 DIAGNOSIS — D649 Anemia, unspecified: Secondary | ICD-10-CM | POA: Diagnosis not present

## 2022-04-12 MED ORDER — AMLODIPINE-OLMESARTAN 10-20 MG PO TABS: 1.0000 | ORAL_TABLET | Freq: Every day | ORAL | 1 refills | Status: DC

## 2022-04-12 NOTE — Assessment & Plan Note (Addendum)
Recently treated for COPD exacerbation.  He is currently prescribed Stiolto for daily use and has an albuterol rescue inhaler.  He has not needed to use his albuterol inhaler recently.  He is asymptomatic currently and his pulmonary exam today was unremarkable. -No medication changes today.  Continue current inhaler regimen. -He continues to work on smoking cessation.  He was counseled on complete cessation again today.

## 2022-04-12 NOTE — Patient Instructions (Signed)
It was a pleasure to see you today.  Thank you for giving [place] the opportunity to be involved in your care.  Below is a brief recap of your visit and next steps.  We will plan to see you again in 6 months.  Summary STOP amlodipine 10 mg daily  RESUME amlodipine-olmesartan 10-20 mg daily  Repeat labs ordered Follow up in 6 months

## 2022-04-12 NOTE — Assessment & Plan Note (Signed)
Noted on recent labs.  A1c 5.8. -Counseled on limiting fatty/fried foods and attempting to incorporate regular exercise into his weekly routine in order to limit risk for progressing to diabetes.

## 2022-04-12 NOTE — Assessment & Plan Note (Signed)
Mild normocytic anemia and thrombocytosis noted on CBC from last month. -Repeat CBC and iron studies ordered today

## 2022-04-12 NOTE — Assessment & Plan Note (Signed)
BP 136/84 today.  He has most recently been prescribed amlodipine-olmesartan 10-20 mg daily.  On review of his medication dispense report, it appears that he recently filled a prescription for amlodipine 10 mg daily for unclear reasons.  He has not been taking amlodipine-olmesartan. -Discontinue amlodipine 10 mg daily and resume amlodipine-olmesartan 10-20 mg daily as previously prescribed.

## 2022-04-12 NOTE — Progress Notes (Signed)
Established Patient Office Visit  Subjective   Patient ID: Shane Allison, male    DOB: [birth year]  Age: 58 y.o. MRN: [id number]  Chief Complaint  Patient presents with   Hypertension    Three month follow up   Shane Allison returns to care today.  He was last seen by me on 1/12 for his annual exam.  He was prescribed prednisone for treatment of COPD exacerbation at that time.  He was also out of Stiolto and this was refilled.  There have been no acute interval events.  Shane Allison reports feeling well today.  He is asymptomatic and has no acute concerns to discuss.  Past Medical History:  Diagnosis Date   Abnormal result of iron profile testing 03/03/2016   AKI (acute kidney injury) ([place]) 05/10/2015   Alcohol use    Asthma    Cervical radiculopathy    Cervical spondylosis without myelopathy 05/14/2013   Chronic neck pain    COPD (chronic obstructive pulmonary disease) ([facility])    GERD (gastroesophageal reflux disease)    GI bleed 05/10/2015   Hypertension    Joint ache    Right AC joint separation   Numbness and tingling in hands 05/14/2013   Partial tear of rotator cuff 04/02/2013   Pneumonia    Psoriasis    Radicular pain in right arm 05/14/2013   Shortness of breath dyspnea    Past Surgical History:  Procedure Laterality Date   ACROMIO-CLAVICULAR JOINT REPAIR Right 10/26/2017   Procedure: RIGHT ACROMIO-CLAVICULAR JOINT RECONSTRUCTION;  Surgeon: Meredith Pel, MD;  Location: [facility];  Service: Orthopedics;  Laterality: Right;   BIOPSY  05/12/2015   Procedure: BIOPSY;  Surgeon: Danie Binder, MD;  [facility];  Service: Endoscopy;;  gastric biopsy   BIOPSY  03/15/2016   Procedure: BIOPSY;  Surgeon: Danie Binder, MD;  [facility];  Service: Endoscopy;;  random colon bx's   COLONOSCOPY WITH PROPOFOL N/A 03/15/2016   Procedure: COLONOSCOPY WITH PROPOFOL;  Surgeon: Danie Binder, MD;  [facility];  Service: Endoscopy;  Laterality: N/A;  12:30  PM   ESOPHAGOGASTRODUODENOSCOPY (EGD) WITH PROPOFOL N/A 05/12/2015   Dr. Oneida Alar: normal esophagus, gastritis, bleeding friable duodenal mucosa   HAND TENDON SURGERY Right    MULTIPLE TOOTH EXTRACTIONS     POLYPECTOMY  03/15/2016   Procedure: POLYPECTOMY;  Surgeon: Danie Binder, MD;  [facility];  Service: Endoscopy;;  rectal polyp   THUMB AMPUTATION     Partial left thumb    Social History   Tobacco Use   Smoking status: Every Day    Packs/day: 2.00    Years: 40.00    Total pack years: 80.00    Types: Cigarettes   Smokeless tobacco: Never   Tobacco comments:    Now smoking one pack a day 06/02/21  Vaping Use   Vaping Use: Never used  Substance Use Topics   Alcohol use: Yes    Comment: PATIENT REPORT 6 PACK OF BEER a day, last use x 2 days ago on 07/21/21   Drug use: No   Family History  Problem Relation Age of Onset   COPD Mother    Colon cancer Neg Hx    Allergies  Allergen Reactions   Penicillins Rash    Has patient had a PCN reaction causing immediate rash, facial/tongue/throat swelling, SOB or lightheadedness with hypotension: Yes Has patient had a PCN reaction causing severe rash involving mucus membranes or  skin necrosis: No Has patient had a PCN reaction that required hospitalization No Has patient had a PCN reaction occurring within the last 10 years: No If all of the above answers are "NO", then may proceed with Cephalosporin use.     Review of Systems  Constitutional:  Negative for chills and fever.  HENT:  Negative for sore throat.   Respiratory:  Negative for cough and shortness of breath.   Cardiovascular:  Negative for chest pain, palpitations and leg swelling.  Gastrointestinal:  Negative for abdominal pain, blood in stool, constipation, diarrhea, nausea and vomiting.  Genitourinary:  Negative for dysuria and hematuria.  Musculoskeletal:  Negative for myalgias.  Skin:  Negative for itching and rash.  Neurological:  Negative for dizziness  and headaches.  Psychiatric/Behavioral:  Negative for depression and suicidal ideas.      Objective:     BP 136/84 (BP Location: Right Arm, Patient Position: Sitting, Cuff Size: Normal)   Pulse 100   Ht 5' 7"$  (1.702 m)   Wt 136 lb 9.6 oz (62 kg)   SpO2 93%   BMI 21.39 kg/m  BP Readings from Last 3 Encounters:  04/12/22 136/84  03/04/22 116/80  01/10/22 127/84   Physical Exam Vitals reviewed.  Constitutional:      General: He is not in acute distress.    Appearance: Normal appearance. He is not ill-appearing.  HENT:     Head: Normocephalic and atraumatic.     Right Ear: External ear normal.     Left Ear: External ear normal.     Nose: Nose normal. No congestion or rhinorrhea.     Mouth/Throat:     Mouth: Mucous membranes are moist.     Pharynx: Oropharynx is clear.  Eyes:     General: No scleral icterus.    Extraocular Movements: Extraocular movements intact.     Conjunctiva/sclera: Conjunctivae normal.     Pupils: Pupils are equal, round, and reactive to light.  Cardiovascular:     Rate and Rhythm: Normal rate and regular rhythm.     Pulses: Normal pulses.     Heart sounds: Normal heart sounds. No murmur heard. Pulmonary:     Effort: Pulmonary effort is normal.     Breath sounds: Normal breath sounds. No wheezing, rhonchi or rales.  Abdominal:     General: Abdomen is flat. Bowel sounds are normal. There is no distension.     Palpations: Abdomen is soft.     Tenderness: There is no abdominal tenderness.  Musculoskeletal:        General: No swelling or deformity. Normal range of motion.     Cervical back: Normal range of motion.  Skin:    General: Skin is warm and dry.     Capillary Refill: Capillary refill takes less than 2 seconds.  Neurological:     General: No focal deficit present.     Mental Status: He is alert and oriented to person, place, and time.     Motor: No weakness.  Psychiatric:        Mood and Affect: Mood normal.        Behavior: Behavior  normal.        Thought Content: Thought content normal.   Last CBC Lab Results  Component Value Date   WBC 8.2 03/04/2022   HGB 12.0 (L) 03/04/2022   HCT 35.2 (L) 03/04/2022   MCV 95 03/04/2022   MCH 32.4 03/04/2022   RDW 11.1 (L) 03/04/2022   PLT 578 (H)  0000000   Last metabolic panel Lab Results  Component Value Date   GLUCOSE 82 03/04/2022   NA 131 (L) 03/04/2022   K 4.9 03/04/2022   CL 95 (L) 03/04/2022   CO2 20 03/04/2022   BUN 9 03/04/2022   CREATININE 0.75 (L) 03/04/2022   EGFR 107 03/04/2022   CALCIUM 9.5 03/04/2022   PROT 7.2 03/04/2022   ALBUMIN 3.8 03/04/2022   LABGLOB 3.4 03/04/2022   AGRATIO 1.1 (L) 03/04/2022   BILITOT 0.3 03/04/2022   ALKPHOS 86 03/04/2022   AST 13 03/04/2022   ALT 10 03/04/2022   ANIONGAP 13 10/26/2017   Last lipids Lab Results  Component Value Date   CHOL 121 03/04/2022   HDL 52 03/04/2022   LDLCALC 53 03/04/2022   TRIG 82 03/04/2022   CHOLHDL 2.3 03/04/2022   Last hemoglobin A1c Lab Results  Component Value Date   HGBA1C 5.8 (H) 03/04/2022   Last thyroid functions Lab Results  Component Value Date   TSH 0.951 03/04/2022   Last vitamin D Lab Results  Component Value Date   VD25OH 24.5 (L) 03/04/2022   Last vitamin B12 and Folate Lab Results  Component Value Date   VITAMINB12 461 03/04/2022   FOLATE 4.8 03/04/2022     Assessment & Plan:   Problem List Items Addressed This Visit       Hypertension, essential    BP 136/84 today.  He has most recently been prescribed amlodipine-olmesartan 10-20 mg daily.  On review of his medication dispense report, it appears that he recently filled a prescription for amlodipine 10 mg daily for unclear reasons.  He has not been taking amlodipine-olmesartan. -Discontinue amlodipine 10 mg daily and resume amlodipine-olmesartan 10-20 mg daily as previously prescribed.      COPD (chronic obstructive pulmonary disease) ([place])    Recently treated for COPD exacerbation.  He is  currently prescribed Stiolto for daily use and has an albuterol rescue inhaler.  He has not needed to use his albuterol inhaler recently.  He is asymptomatic currently and his pulmonary exam today was unremarkable. -No medication changes today.  Continue current inhaler regimen. -He continues to work on smoking cessation.  He was counseled on complete cessation again today.      Normocytic anemia - Primary    Mild normocytic anemia and thrombocytosis noted on CBC from last month. -Repeat CBC and iron studies ordered today      Prediabetes    Noted on recent labs.  A1c 5.8. -Counseled on limiting fatty/fried foods and attempting to incorporate regular exercise into his weekly routine in order to limit risk for progressing to diabetes.      Vitamin D insufficiency    Vitamin D level 24.5 on labs from last month. -Daily vitamin D supplementation recommended      Return in about 6 months (around 10/11/2022).    Johnette Abraham, MD

## 2022-04-12 NOTE — Assessment & Plan Note (Signed)
Vitamin D level 24.5 on labs from last month. -Daily vitamin D supplementation recommended

## 2022-04-13 ENCOUNTER — Other Ambulatory Visit: Payer: Self-pay

## 2022-04-13 LAB — CBC WITH DIFFERENTIAL/PLATELET
Basophils Absolute: 0.1 10*3/uL (ref 0.0–0.2)
Basos: 1 %
EOS (ABSOLUTE): 0.1 10*3/uL (ref 0.0–0.4)
Eos: 1 %
Hematocrit: 36 % — ABNORMAL LOW (ref 37.5–51.0)
Hemoglobin: 12.6 g/dL — ABNORMAL LOW (ref 13.0–17.7)
Immature Grans (Abs): 0 10*3/uL (ref 0.0–0.1)
Immature Granulocytes: 0 %
Lymphocytes Absolute: 1.6 10*3/uL (ref 0.7–3.1)
Lymphs: 27 %
MCH: 33.2 pg — ABNORMAL HIGH (ref 26.6–33.0)
MCHC: 35 g/dL (ref 31.5–35.7)
MCV: 95 fL (ref 79–97)
Monocytes Absolute: 0.6 10*3/uL (ref 0.1–0.9)
Monocytes: 9 %
Neutrophils Absolute: 3.6 10*3/uL (ref 1.4–7.0)
Neutrophils: 62 %
Platelets: 340 10*3/uL (ref 150–450)
RBC: 3.8 x10E6/uL — ABNORMAL LOW (ref 4.14–5.80)
RDW: 12.2 % (ref 11.6–15.4)
WBC: 6 10*3/uL (ref 3.4–10.8)

## 2022-04-13 LAB — IRON,TIBC AND FERRITIN PANEL
Ferritin: 188 ng/mL (ref 30–400)
Iron Saturation: 20 % (ref 15–55)
Iron: 61 ug/dL (ref 38–169)
Total Iron Binding Capacity: 298 ug/dL (ref 250–450)
UIBC: 237 ug/dL (ref 111–343)

## 2022-04-13 MED ORDER — VENTOLIN HFA 108 (90 BASE) MCG/ACT IN AERS
2.0000 | INHALATION_SPRAY | Freq: Four times a day (QID) | RESPIRATORY_TRACT | 3 refills | Status: DC | PRN
Start: 2022-04-13 — End: 2022-08-31

## 2022-04-15 ENCOUNTER — Ambulatory Visit: Payer: Medicaid Other | Admitting: Internal Medicine

## 2022-04-21 ENCOUNTER — Encounter: Payer: Self-pay | Admitting: Radiology

## 2022-07-25 ENCOUNTER — Other Ambulatory Visit: Payer: Self-pay | Admitting: Internal Medicine

## 2022-07-25 DIAGNOSIS — J449 Chronic obstructive pulmonary disease, unspecified: Secondary | ICD-10-CM

## 2022-08-31 ENCOUNTER — Other Ambulatory Visit: Payer: Self-pay | Admitting: Internal Medicine

## 2022-10-10 ENCOUNTER — Other Ambulatory Visit: Payer: Self-pay | Admitting: Internal Medicine

## 2022-10-10 DIAGNOSIS — I10 Essential (primary) hypertension: Secondary | ICD-10-CM

## 2022-10-11 ENCOUNTER — Encounter: Payer: Self-pay | Admitting: Internal Medicine

## 2022-10-11 ENCOUNTER — Ambulatory Visit: Payer: Medicaid Other | Admitting: Internal Medicine

## 2022-10-11 VITALS — BP 112/76 | HR 84 | Ht 67.0 in | Wt 123.4 lb

## 2022-10-11 DIAGNOSIS — I10 Essential (primary) hypertension: Secondary | ICD-10-CM

## 2022-10-11 DIAGNOSIS — R7303 Prediabetes: Secondary | ICD-10-CM

## 2022-10-11 DIAGNOSIS — D649 Anemia, unspecified: Secondary | ICD-10-CM | POA: Diagnosis not present

## 2022-10-11 DIAGNOSIS — R918 Other nonspecific abnormal finding of lung field: Secondary | ICD-10-CM | POA: Diagnosis not present

## 2022-10-11 DIAGNOSIS — R911 Solitary pulmonary nodule: Secondary | ICD-10-CM

## 2022-10-11 DIAGNOSIS — R634 Abnormal weight loss: Secondary | ICD-10-CM

## 2022-10-11 DIAGNOSIS — J449 Chronic obstructive pulmonary disease, unspecified: Secondary | ICD-10-CM | POA: Diagnosis not present

## 2022-10-11 DIAGNOSIS — E559 Vitamin D deficiency, unspecified: Secondary | ICD-10-CM

## 2022-10-11 NOTE — Assessment & Plan Note (Signed)
HTN is adequately controlled on current antihypertensive regimen of amlodipine-olmesartan.  No medication changes are indicated today.

## 2022-10-11 NOTE — Progress Notes (Signed)
Established Patient Office Visit  Subjective   Patient ID: Shane Allison, male    DOB: 10-15-1965  Age: 57 y.o. MRN: [id number]  Chief Complaint  Patient presents with   Hypertension    Follow up   Shane Allison returns to care today for routine follow-up.  He was last evaluated by me on 2/20.  Amlodipine 10 mg daily was discontinued at that time and he was instructed to resume amlodipine-olmesartan 10-20 mg daily as previously prescribed.  No additional medication changes were made and 59-month follow-up was arranged.  There have been no acute interval events.  Shane Allison acute concern today is weakness in his legs.  He states that 3-4 times daily his legs will give out while he is standing.  He endorses associated numbness/tingling.  Further denies associated symptoms.  He has lost approximately 20 pounds since January unintentionally.  He denies fever/chills, fatigue, night sweats, melena/hematochezia, and has not appreciated any lymphadenopathy.  He states that he eats one meal per day.  Past Medical History:  Diagnosis Date   Abnormal result of iron profile testing 03/03/2016   AKI (acute kidney injury) (HCC) 05/10/2015   Alcohol use    Asthma    Cervical radiculopathy    Cervical spondylosis without myelopathy 05/14/2013   Chronic neck pain    COPD (chronic obstructive pulmonary disease) (HCC)    GERD (gastroesophageal reflux disease)    GI bleed 05/10/2015   Hypertension    Joint ache    Right AC joint separation   Numbness and tingling in hands 05/14/2013   Partial tear of rotator cuff 04/02/2013   Pneumonia    Psoriasis    Radicular pain in right arm 05/14/2013   Shortness of breath dyspnea    Past Surgical History:  Procedure Laterality Date   ACROMIO-CLAVICULAR JOINT REPAIR Right 10/26/2017   Procedure: RIGHT ACROMIO-CLAVICULAR JOINT RECONSTRUCTION;  Surgeon: Cammy Copa, MD;  Location: [facility] OR;  Service: Orthopedics;  Laterality: Right;   BIOPSY  05/12/2015    Procedure: BIOPSY;  Surgeon: West Bali, MD;  [facility]: [facility];  Service: Endoscopy;;  gastric biopsy   BIOPSY  03/15/2016   Procedure: BIOPSY;  Surgeon: West Bali, MD;  Location: [facility];  Service: Endoscopy;;  random colon bx's   COLONOSCOPY WITH PROPOFOL N/A 03/15/2016   Procedure: COLONOSCOPY WITH PROPOFOL;  Surgeon: West Bali, MD;  Location: [facility];  Service: Endoscopy;  Laterality: N/A;  12:30 PM   ESOPHAGOGASTRODUODENOSCOPY (EGD) WITH PROPOFOL N/A 05/12/2015   Dr. Darrick Penna: normal esophagus, gastritis, bleeding friable duodenal mucosa   HAND TENDON SURGERY Right    MULTIPLE TOOTH EXTRACTIONS     POLYPECTOMY  03/15/2016   Procedure: POLYPECTOMY;  Surgeon: West Bali, MD;  Location: [facility];  Service: Endoscopy;;  rectal polyp   THUMB AMPUTATION     Partial left thumb    Social History   Tobacco Use   Smoking status: Every Day    Current packs/day: 2.00    Average packs/day: 2.0 packs/day for 40.0 years (80.0 ttl pk-yrs)    Types: Cigarettes   Smokeless tobacco: Never   Tobacco comments:    Now smoking one pack a day 06/02/21  Vaping Use   Vaping status: Never Used  Substance Use Topics   Alcohol use: Yes    Comment: PATIENT REPORT 6 PACK OF BEER a day, last use x 2 days ago on 07/21/21   Drug use: No  Family History  Problem Relation Age of Onset   COPD Mother    Colon cancer Neg Hx    Allergies  Allergen Reactions   Penicillins Rash    Has patient had a PCN reaction causing immediate rash, facial/tongue/throat swelling, SOB or lightheadedness with hypotension: Yes Has patient had a PCN reaction causing severe rash involving mucus membranes or skin necrosis: No Has patient had a PCN reaction that required hospitalization No Has patient had a PCN reaction occurring within the last 10 years: No If all of the above answers are "NO", then may proceed with Cephalosporin use.     Review of Systems  Constitutional:  Positive for  weight loss.  Neurological:  Positive for weakness (Lower extremities).     Objective:     BP 112/76   Pulse 84   Ht 5\' 7"  (1.702 m)   Wt 123 lb 6.4 oz (56 kg)   SpO2 96%   BMI 19.33 kg/m  BP Readings from Last 3 Encounters:  10/11/22 112/76  04/12/22 136/84  03/04/22 116/80   Physical Exam Vitals reviewed.  Constitutional:      General: He is not in acute distress.    Appearance: Normal appearance. He is not ill-appearing.  HENT:     Head: Normocephalic and atraumatic.     Right Ear: External ear normal.     Left Ear: External ear normal.     Nose: Nose normal. No congestion or rhinorrhea.     Mouth/Throat:     Mouth: Mucous membranes are moist.     Pharynx: Oropharynx is clear.  Eyes:     General: No scleral icterus.    Extraocular Movements: Extraocular movements intact.     Conjunctiva/sclera: Conjunctivae normal.     Pupils: Pupils are equal, round, and reactive to light.  Cardiovascular:     Rate and Rhythm: Normal rate and regular rhythm.     Pulses: Normal pulses.     Heart sounds: Normal heart sounds. No murmur heard. Pulmonary:     Effort: Pulmonary effort is normal.     Breath sounds: Normal breath sounds. No wheezing, rhonchi or rales.  Abdominal:     General: Abdomen is flat. Bowel sounds are normal. There is no distension.     Palpations: Abdomen is soft.     Tenderness: There is no abdominal tenderness.  Musculoskeletal:        General: No swelling or deformity. Normal range of motion.     Cervical back: Normal range of motion.  Skin:    General: Skin is warm and dry.     Capillary Refill: Capillary refill takes less than 2 seconds.  Neurological:     General: No focal deficit present.     Mental Status: He is alert and oriented to person, place, and time.     Motor: No weakness.  Psychiatric:        Mood and Affect: Mood normal.        Behavior: Behavior normal.        Thought Content: Thought content normal.   Last CBC Lab Results   Component Value Date   WBC 6.0 04/12/2022   HGB 12.6 (L) 04/12/2022   HCT 36.0 (L) 04/12/2022   MCV 95 04/12/2022   MCH 33.2 (H) 04/12/2022   RDW 12.2 04/12/2022   PLT 340 04/12/2022   Last metabolic panel Lab Results  Component Value Date   GLUCOSE 82 03/04/2022   NA 131 (L) 03/04/2022   K  4.9 03/04/2022   CL 95 (L) 03/04/2022   CO2 20 03/04/2022   BUN 9 03/04/2022   CREATININE 0.75 (L) 03/04/2022   EGFR 107 03/04/2022   CALCIUM 9.5 03/04/2022   PROT 7.2 03/04/2022   ALBUMIN 3.8 03/04/2022   LABGLOB 3.4 03/04/2022   AGRATIO 1.1 (L) 03/04/2022   BILITOT 0.3 03/04/2022   ALKPHOS 86 03/04/2022   AST 13 03/04/2022   ALT 10 03/04/2022   ANIONGAP 13 10/26/2017   Last lipids Lab Results  Component Value Date   CHOL 121 03/04/2022   HDL 52 03/04/2022   LDLCALC 53 03/04/2022   TRIG 82 03/04/2022   CHOLHDL 2.3 03/04/2022   Last hemoglobin A1c Lab Results  Component Value Date   HGBA1C 5.8 (H) 03/04/2022   Last thyroid functions Lab Results  Component Value Date   TSH 0.951 03/04/2022   Last vitamin D Lab Results  Component Value Date   VD25OH 24.5 (L) 03/04/2022   Last vitamin B12 and Folate Lab Results  Component Value Date   VITAMINB12 461 03/04/2022   FOLATE 4.8 03/04/2022     Assessment & Plan:   Problem List Items Addressed This Visit       Hypertension, essential    HTN is adequately controlled on current antihypertensive regimen of amlodipine-olmesartan.  No medication changes are indicated today.      Lung nodule seen on imaging study    Multiple pulmonary nodules measuring less than 5 mm noted on CT chest from September 2023.  Given patient's high risk, repeat CT recommended for 12 months. -In the setting of 20 pound unintentional weight loss since January 2024, CT chest abdomen pelvis ordered today.      Unintentional weight loss of more than 10% body weight within 6 months - Primary    Patient is lost 20 pounds since January  unintentionally.  No B symptoms identified.  No lymphadenopathy appreciated on exam today.  Etiology unclear.  He has known pulmonary nodules as well as COPD. Will order repeat labs and CT chest, abdomen, pelvis for further workup.  Consider pulmonary cachexia in the absence of concerning laboratory or imaging findings. -Follow-up in 6 weeks for reassessment.      Return in about 6 weeks (around 11/22/2022).   Billie Lade, MD

## 2022-10-11 NOTE — Assessment & Plan Note (Signed)
Patient is lost 20 pounds since January unintentionally.  No B symptoms identified.  No lymphadenopathy appreciated on exam today.  Etiology unclear.  He has known pulmonary nodules as well as COPD. Will order repeat labs and CT chest, abdomen, pelvis for further workup.  Consider pulmonary cachexia in the absence of concerning laboratory or imaging findings. -Follow-up in 6 weeks for reassessment.

## 2022-10-11 NOTE — Patient Instructions (Signed)
It was a pleasure to see you today.  Thank you for giving [place] the opportunity to be involved in your care.  Below is a brief recap of your visit and next steps.  We will plan to see you again in 6 weeks.  Summary No medication changes today CT chest, abdomen, pelvis ordered Repeat labs ordered Follow up in 6 weeks

## 2022-10-11 NOTE — Assessment & Plan Note (Signed)
Multiple pulmonary nodules measuring less than 5 mm noted on CT chest from September 2023.  Given patient's high risk, repeat CT recommended for 12 months. -In the setting of 20 pound unintentional weight loss since January 2024, CT chest abdomen pelvis ordered today.

## 2022-10-12 LAB — CBC WITH DIFFERENTIAL/PLATELET
Basophils Absolute: 0.1 10*3/uL (ref 0.0–0.2)
Basos: 1 %
EOS (ABSOLUTE): 0.1 10*3/uL (ref 0.0–0.4)
Eos: 1 %
Hematocrit: 35.2 % — ABNORMAL LOW (ref 37.5–51.0)
Hemoglobin: 12.2 g/dL — ABNORMAL LOW (ref 13.0–17.7)
Immature Grans (Abs): 0 10*3/uL (ref 0.0–0.1)
Immature Granulocytes: 0 %
Lymphocytes Absolute: 2.1 10*3/uL (ref 0.7–3.1)
Lymphs: 29 %
MCH: 34.1 pg — ABNORMAL HIGH (ref 26.6–33.0)
MCHC: 34.7 g/dL (ref 31.5–35.7)
MCV: 98 fL — ABNORMAL HIGH (ref 79–97)
Monocytes Absolute: 0.6 10*3/uL (ref 0.1–0.9)
Monocytes: 8 %
Neutrophils Absolute: 4.4 10*3/uL (ref 1.4–7.0)
Neutrophils: 61 %
Platelets: 371 10*3/uL (ref 150–450)
RBC: 3.58 x10E6/uL — ABNORMAL LOW (ref 4.14–5.80)
RDW: 11.8 % (ref 11.6–15.4)
WBC: 7.2 10*3/uL (ref 3.4–10.8)

## 2022-10-12 LAB — CMP14+EGFR
ALT: 11 IU/L (ref 0–44)
AST: 18 IU/L (ref 0–40)
Albumin: 4.1 g/dL (ref 3.8–4.9)
Alkaline Phosphatase: 66 IU/L (ref 44–121)
BUN/Creatinine Ratio: 18 (ref 9–20)
BUN: 15 mg/dL (ref 6–24)
Bilirubin Total: 0.3 mg/dL (ref 0.0–1.2)
CO2: 23 mmol/L (ref 20–29)
Calcium: 9.5 mg/dL (ref 8.7–10.2)
Chloride: 100 mmol/L (ref 96–106)
Creatinine, Ser: 0.84 mg/dL (ref 0.76–1.27)
Globulin, Total: 2.4 g/dL (ref 1.5–4.5)
Glucose: 98 mg/dL (ref 70–99)
Potassium: 5.1 mmol/L (ref 3.5–5.2)
Sodium: 134 mmol/L (ref 134–144)
Total Protein: 6.5 g/dL (ref 6.0–8.5)
eGFR: 103 mL/min/{1.73_m2} (ref >59)

## 2022-10-12 LAB — B12 AND FOLATE PANEL
Folate: 4.7 ng/mL (ref >3.0)
Vitamin B-12: 344 pg/mL (ref 232–1245)

## 2022-10-12 LAB — LIPID PANEL
Chol/HDL Ratio: 2.1 ratio (ref 0.0–5.0)
Cholesterol, Total: 148 mg/dL (ref 100–199)
HDL: 72 mg/dL (ref >39)
LDL Chol Calc (NIH): 63 mg/dL (ref 0–99)
Triglycerides: 61 mg/dL (ref 0–149)
VLDL Cholesterol Cal: 13 mg/dL (ref 5–40)

## 2022-10-12 LAB — TSH+FREE T4
Free T4: 1.32 ng/dL (ref 0.82–1.77)
TSH: 0.707 u[IU]/mL (ref 0.450–4.500)

## 2022-10-12 LAB — VITAMIN D 25 HYDROXY (VIT D DEFICIENCY, FRACTURES): Vit D, 25-Hydroxy: 62.6 ng/mL (ref 30.0–100.0)

## 2022-10-12 LAB — HEMOGLOBIN A1C
Est. average glucose Bld gHb Est-mCnc: 108 mg/dL
Hgb A1c MFr Bld: 5.4 % (ref 4.8–5.6)

## 2022-10-19 ENCOUNTER — Ambulatory Visit
Admission: RE | Admit: 2022-10-19 | Discharge: 2022-10-19 | Disposition: A | Discharge status: Home / Self Care | Payer: Medicaid Other | Source: Ambulatory Visit | Attending: Internal Medicine | Admitting: Internal Medicine

## 2022-10-19 DIAGNOSIS — R918 Other nonspecific abnormal finding of lung field: Secondary | ICD-10-CM

## 2022-10-19 DIAGNOSIS — Z87891 Personal history of nicotine dependence: Secondary | ICD-10-CM | POA: Diagnosis not present

## 2022-10-19 DIAGNOSIS — I700 Atherosclerosis of aorta: Secondary | ICD-10-CM | POA: Diagnosis not present

## 2022-10-19 DIAGNOSIS — R634 Abnormal weight loss: Secondary | ICD-10-CM

## 2022-10-21 ENCOUNTER — Ambulatory Visit (HOSPITAL_COMMUNITY): Payer: Medicaid Other

## 2022-12-01 ENCOUNTER — Ambulatory Visit: Payer: Medicaid Other | Admitting: Internal Medicine

## 2022-12-06 ENCOUNTER — Encounter: Payer: Self-pay | Admitting: Internal Medicine

## 2022-12-28 ENCOUNTER — Other Ambulatory Visit: Payer: Self-pay | Admitting: Internal Medicine

## 2022-12-28 DIAGNOSIS — J449 Chronic obstructive pulmonary disease, unspecified: Secondary | ICD-10-CM

## 2023-01-06 ENCOUNTER — Encounter: Payer: Self-pay | Admitting: Internal Medicine

## 2023-01-06 ENCOUNTER — Ambulatory Visit: Payer: Medicaid Other | Admitting: Internal Medicine

## 2023-01-06 VITALS — BP 128/85 | HR 81 | Resp 16 | Ht 67.0 in | Wt 129.0 lb

## 2023-01-06 DIAGNOSIS — Z23 Encounter for immunization: Secondary | ICD-10-CM | POA: Diagnosis not present

## 2023-01-06 DIAGNOSIS — M25512 Pain in left shoulder: Secondary | ICD-10-CM | POA: Insufficient documentation

## 2023-01-06 MED ORDER — MELOXICAM 7.5 MG PO TABS: 7.5000 mg | ORAL_TABLET | Freq: Every day | ORAL | 0 refills | Status: AC

## 2023-01-06 NOTE — Patient Instructions (Signed)
It was a pleasure to see you today.  Thank you for giving [place] the opportunity to be involved in your care.  Below is a brief recap of your visit and next steps.  We will plan to see you again in 6 months.  Summary Orthopedic surgery referral placed Meloxicam 7.5 mg daily x 2 weeks Follow up in 6 months

## 2023-01-06 NOTE — Assessment & Plan Note (Signed)
His acute concern today is left shoulder pain, worse with abduction and internal rotation.  He would like to establish care with orthopedic surgery to discuss treatment options. -Orthopedic surgery referral placed today -Meloxicam 7.5 mg daily x 14 days prescribed for improved pain relief.

## 2023-01-06 NOTE — Progress Notes (Signed)
Established Patient Office Visit  Subjective   Patient ID: Shane Allison, male    DOB: 1965-11-28  Age: 57 y.o. MRN: [id number]  Chief Complaint  Patient presents with   Shoulder Pain    Left shoulder pain x 1 month. Hurts when he tries to raise it up or reach behind his back   Shane Allison returns to care today for follow-up.  He was last evaluated by me on 8/20.  At that time he endorsed a 20 pound unintentional weight loss over the last year.  Repeat labs and CT chest, abdomen, and pelvis were ordered.  6-week follow-up was recommended for reassessment.  Imaging and labs obtained in the interim have been reassuring.  There have otherwise been no acute interval events.  Shane Allison acute concern today is left shoulder pain.  He endorses pain along the anterior aspect of the left shoulder for the last month.  Pain is exacerbated with abduction and internal rotation.  He has tried taking Tylenol for pain relief.  He does not have any additional concerns to discuss today.  He has gained 6 pounds since his last appointment.  Past Medical History:  Diagnosis Date   Abnormal result of iron profile testing 03/03/2016   AKI (acute kidney injury) (HCC) 05/10/2015   Alcohol use    Asthma    Cervical radiculopathy    Cervical spondylosis without myelopathy 05/14/2013   Chronic neck pain    COPD (chronic obstructive pulmonary disease) (HCC)    GERD (gastroesophageal reflux disease)    GI bleed 05/10/2015   Hypertension    Joint ache    Right AC joint separation   Numbness and tingling in hands 05/14/2013   Partial tear of rotator cuff 04/02/2013   Pneumonia    Psoriasis    Radicular pain in right arm 05/14/2013   Shortness of breath dyspnea    Past Surgical History:  Procedure Laterality Date   ACROMIO-CLAVICULAR JOINT REPAIR Right 10/26/2017   Procedure: RIGHT ACROMIO-CLAVICULAR JOINT RECONSTRUCTION;  Surgeon: Cammy Copa, MD;  Location: [facility] OR;  Service: Orthopedics;  Laterality:  Right;   BIOPSY  05/12/2015   Procedure: BIOPSY;  Surgeon: West Bali, MD;  [facility];  Service: Endoscopy;;  gastric biopsy   BIOPSY  03/15/2016   Procedure: BIOPSY;  Surgeon: West Bali, MD;  [facility];  Service: Endoscopy;;  random colon bx's   COLONOSCOPY WITH PROPOFOL N/A 03/15/2016   Procedure: COLONOSCOPY WITH PROPOFOL;  Surgeon: West Bali, MD;  [facility]: [facility];  Service: Endoscopy;  Laterality: N/A;  12:30 PM   ESOPHAGOGASTRODUODENOSCOPY (EGD) WITH PROPOFOL N/A 05/12/2015   Dr. Darrick Penna: normal esophagus, gastritis, bleeding friable duodenal mucosa   HAND TENDON SURGERY Right    MULTIPLE TOOTH EXTRACTIONS     POLYPECTOMY  03/15/2016   Procedure: POLYPECTOMY;  Surgeon: West Bali, MD;  [facility]: [facility];  Service: Endoscopy;;  rectal polyp   THUMB AMPUTATION     Partial left thumb    Social History   Tobacco Use   Smoking status: Every Day    Current packs/day: 2.00    Average packs/day: 2.0 packs/day for 40.0 years (80.0 ttl pk-yrs)    Types: Cigarettes   Smokeless tobacco: Never   Tobacco comments:    Now smoking one pack a day 06/02/21  Vaping Use   Vaping status: Never Used  Substance Use Topics   Alcohol use: Yes    Comment: PATIENT  REPORT 6 PACK OF BEER a day, last use x 2 days ago on 07/21/21   Drug use: No   Family History  Problem Relation Age of Onset   COPD Mother    Colon cancer Neg Hx    Allergies  Allergen Reactions   Penicillins Rash    Has patient had a PCN reaction causing immediate rash, facial/tongue/throat swelling, SOB or lightheadedness with hypotension: Yes Has patient had a PCN reaction causing severe rash involving mucus membranes or skin necrosis: No Has patient had a PCN reaction that required hospitalization No Has patient had a PCN reaction occurring within the last 10 years: No If all of the above answers are "NO", then may proceed with Cephalosporin use.     Review of Systems   Musculoskeletal:  Positive for joint pain (Left shoulder).  All other systems reviewed and are negative.    Objective:     BP 128/85   Pulse 81   Resp 16   Ht 5\' 7"  (1.702 m)   Wt 129 lb (58.5 kg)   SpO2 98%   BMI 20.20 kg/m  BP Readings from Last 3 Encounters:  01/06/23 128/85  10/11/22 112/76  04/12/22 136/84   Physical Exam Vitals reviewed.  Constitutional:      General: He is not in acute distress.    Appearance: Normal appearance. He is not ill-appearing.  HENT:     Head: Normocephalic and atraumatic.     Right Ear: External ear normal.     Left Ear: External ear normal.     Nose: Nose normal. No congestion or rhinorrhea.     Mouth/Throat:     Mouth: Mucous membranes are moist.     Pharynx: Oropharynx is clear.  Eyes:     General: No scleral icterus.    Extraocular Movements: Extraocular movements intact.     Conjunctiva/sclera: Conjunctivae normal.     Pupils: Pupils are equal, round, and reactive to light.  Cardiovascular:     Rate and Rhythm: Normal rate and regular rhythm.     Pulses: Normal pulses.     Heart sounds: Normal heart sounds. No murmur heard. Pulmonary:     Effort: Pulmonary effort is normal.     Breath sounds: Normal breath sounds. No wheezing, rhonchi or rales.  Abdominal:     General: Abdomen is flat. Bowel sounds are normal. There is no distension.     Palpations: Abdomen is soft.     Tenderness: There is no abdominal tenderness.  Musculoskeletal:        General: No swelling or deformity.     Cervical back: Normal range of motion.     Comments: ROM of the left shoulder reduced, most notably abduction.  Skin:    General: Skin is warm and dry.     Capillary Refill: Capillary refill takes less than 2 seconds.  Neurological:     General: No focal deficit present.     Mental Status: He is alert and oriented to person, place, and time.     Motor: No weakness.  Psychiatric:        Mood and Affect: Mood normal.        Behavior: Behavior  normal.        Thought Content: Thought content normal.   Last CBC Lab Results  Component Value Date   WBC 7.2 10/11/2022   HGB 12.2 (L) 10/11/2022   HCT 35.2 (L) 10/11/2022   MCV 98 (H) 10/11/2022   MCH 34.1 (H)  10/11/2022   RDW 11.8 10/11/2022   PLT 371 10/11/2022   Last metabolic panel Lab Results  Component Value Date   GLUCOSE 98 10/11/2022   NA 134 10/11/2022   K 5.1 10/11/2022   CL 100 10/11/2022   CO2 23 10/11/2022   BUN 15 10/11/2022   CREATININE 0.84 10/11/2022   EGFR 103 10/11/2022   CALCIUM 9.5 10/11/2022   PROT 6.5 10/11/2022   ALBUMIN 4.1 10/11/2022   LABGLOB 2.4 10/11/2022   AGRATIO 1.1 (L) 03/04/2022   BILITOT 0.3 10/11/2022   ALKPHOS 66 10/11/2022   AST 18 10/11/2022   ALT 11 10/11/2022   ANIONGAP 13 10/26/2017   Last lipids Lab Results  Component Value Date   CHOL 148 10/11/2022   HDL 72 10/11/2022   LDLCALC 63 10/11/2022   TRIG 61 10/11/2022   CHOLHDL 2.1 10/11/2022   Last hemoglobin A1c Lab Results  Component Value Date   HGBA1C 5.4 10/11/2022   Last thyroid functions Lab Results  Component Value Date   TSH 0.707 10/11/2022   Last vitamin D Lab Results  Component Value Date   VD25OH 62.6 10/11/2022   Last vitamin B12 and Folate Lab Results  Component Value Date   VITAMINB12 344 10/11/2022   FOLATE 4.7 10/11/2022   The 10-year ASCVD risk score (Arnett DK, et al., 2019) is: 6.3%    Assessment & Plan:   Problem List Items Addressed This Visit       Acute pain of left shoulder - Primary    His acute concern today is left shoulder pain, worse with abduction and internal rotation.  He would like to establish care with orthopedic surgery to discuss treatment options. -Orthopedic surgery referral placed today -Meloxicam 7.5 mg daily x 14 days prescribed for improved pain relief.      Need for influenza vaccination    Influenza vaccine administered today       Return in about 6 months (around 07/06/2023).    Billie Lade, MD

## 2023-01-06 NOTE — Assessment & Plan Note (Signed)
Influenza vaccine administered today.

## 2023-01-11 ENCOUNTER — Encounter: Payer: Self-pay | Admitting: Orthopedic Surgery

## 2023-01-11 ENCOUNTER — Other Ambulatory Visit (INDEPENDENT_AMBULATORY_CARE_PROVIDER_SITE_OTHER): Payer: Medicaid Other

## 2023-01-11 ENCOUNTER — Ambulatory Visit: Payer: Medicaid Other | Admitting: Orthopedic Surgery

## 2023-01-11 VITALS — Ht 67.0 in | Wt 134.8 lb

## 2023-01-11 DIAGNOSIS — M25512 Pain in left shoulder: Secondary | ICD-10-CM | POA: Diagnosis not present

## 2023-01-11 NOTE — Patient Instructions (Signed)
Instructions Following Joint Injections  In clinic today, you received an injection in one of your joints (sometimes more than one).  Occasionally, you can have some pain at the injection site, this is normal.  You can place ice at the injection site, or take over-the-counter medications such as Tylenol (acetaminophen) or Advil (ibuprofen).  Please follow all directions listed on the bottle.  If your joint (knee or shoulder) becomes swollen, red or very painful, please contact the clinic for additional assistance.   Two medications were injected, including lidocaine and a steroid (often referred to as cortisone).  Lidocaine is effective almost immediately but wears off quickly.  However, the steroid can take a few days to improve your symptoms.  In some cases, it can make your pain worse for a couple of days.  Do not be concerned if this happens as it is common.  You can apply ice or take some over-the-counter medications as needed.   Injections in the same joint cannot be repeated for 3 months.  This helps to limit the risk of an infection in the joint.  If you were to develop an infection in your joint, the best treatment option would be surgery.    Rotator Cuff Tear/Tendinitis Rehab   Ask your health care provider which exercises are safe for you. Do exercises exactly as told by your health care provider and adjust them as directed. It is normal to feel mild stretching, pulling, tightness, or discomfort as you do these exercises. Stop right away if you feel sudden pain or your pain gets worse. Do not begin these exercises until told by your health care provider. Stretching and range-of-motion exercises  These exercises warm up your muscles and joints and improve the movement and flexibility of your shoulder. These exercises also help to relieve pain.  Shoulder pendulum In this exercise, you let the injured arm dangle toward the floor and then swing it like a clock pendulum. Stand near a table  or counter that you can hold onto for balance. Bend forward at the waist and let your left / right arm hang straight down. Use your other arm to support you and help you stay balanced. Relax your left / right arm and shoulder muscles, and move your hips and your trunk so your left / right arm swings freely. Your arm should swing because of the motion of your body, not because you are using your arm or shoulder muscles. Keep moving your hips and trunk so your arm swings in the following directions, as told by your health care provider: Side to side. Forward and backward. In clockwise and counterclockwise circles. Slowly return to the starting position. Repeat 10 times, or for 10 seconds per direction. Complete this exercise 2-3 times a day.      Shoulder flexion, seated This exercise is sometimes called table slides. In this exercise, you raise your arm in front of your body until you feel a stretch in your injured shoulder. Sit in a stable chair so your left / right forearm can rest on a flat surface. Your elbow should rest at a height that keeps your upper arm next to your body. Keeping your left / right shoulder relaxed, lean forward at the waist and let your hand slide forward (flexion). Stop when you feel a stretch in your shoulder, or when you reach the angle that is recommended by your health care provider. Hold for 5 seconds. Slowly return to the starting position. Repeat 10 times. Complete this  exercise 1-2  times a day.       Shoulder flexion, standing In this exercise, you raise your arm in front of your body (flexion) until you feel a stretch in your injured shoulder. Stand and hold a broomstick, a cane, or a similar object. Place your hands a little more than shoulder-width apart on the object. Your left / right hand should be palm-up, and your other hand should be palm-down. Keep your elbow straight and your shoulder muscles relaxed. Push the stick up with your healthy arm to raise  your left / right arm in front of your body, and then over your head until you feel a stretch in your shoulder. Avoid shrugging your shoulder while you raise your arm. Keep your shoulder blade tucked down toward the middle of your back. Keep your left / right shoulder muscles relaxed. Hold for 10 seconds. Slowly return to the starting position. Repeat 10 times. Complete this exercise 1-2 times a day.      Shoulder abduction, active-assisted You will need a stick, broom handle, or similar object to help you (assist) in doing this exercise. Lie on your back. This is the supine position. Hold a broomstick, a cane, or a similar object. Place your hands a little more than shoulder-width apart on the object. Your left / right hand should be palm-up, and your other hand should be palm-down. Keeping your shoulder relaxed, push the stick to raise your left / right arm out to your side (abduction) and then over your head. Use your other hand to help move the stick. Stop when you feel a stretch in your shoulder, or when you reach the angle that is recommended by your health care provider. Avoid shrugging your shoulder while you raise your arm. Keep your shoulder blade tucked down toward the middle of your back. Hold for 10 seconds. Slowly return to the starting position. Repeat 10 times. Complete this exercise 1-2 times a day.      Shoulder flexion, active-assisted Lie on your back. You may bend your knees for comfort. Hold a broomstick, a cane, or a similar object so that your hands are about shoulder-width apart. Your palms should face toward your feet. Raise your left / right arm over your head, then behind your head toward the floor (flexion). Use your other hand to help you do this (active-assisted). Stop when you feel a gentle stretch in your shoulder, or when you reach the angle that is recommended by your health care provider. Hold for 10 seconds. Use the stick and your other arm to help you  return your left / right arm to the starting position. Repeat 10 times. Complete this exercise 1-2 times a day.      External rotation Sit in a stable chair without armrests, or stand up. Tuck a soft object, such as a folded towel or a small ball, under your left / right upper arm. Hold a broomstick, a cane, or a similar object with your palms face-down, toward the floor. Bend your elbows to a 90-degree angle (right angle), and keep your hands about shoulder-width apart. Straighten your healthy arm and push the stick across your body, toward your left / right side. Keep your left / right arm bent. This will rotate your left / right forearm away from your body (external rotation). Hold for 10 seconds. Slowly return to the starting position. Repeat 10 times. Complete this exercise 1-2 times a day.        Strengthening exercises  These exercises build strength and endurance in your shoulder. Endurance is the ability to use your muscles for a long time, even after they get tired. Do not start doing these exercises until your health care provider approves. Shoulder flexion, isometric Stand or sit in a doorway, facing the door frame. Keep your left / right arm straight and make a gentle fist with your hand. Place your fist against the door frame. Only your fist should be touching the frame. Keep your upper arm at your side. Gently press your fist against the door frame, as if you are trying to raise your arm above your head (isometric shoulder flexion). Avoid shrugging your shoulder while you press your hand into the door frame. Keep your shoulder blade tucked down toward the middle of your back. Hold for 10 seconds. Slowly release the tension, and relax your muscles completely before you repeat the exercise. Repeat 10 times. Complete this exercise 3 times per week.      Shoulder abduction, isometric Stand or sit in a doorway. Your left / right arm should be closest to the door frame. Keep your  left / right arm straight, and place the back of your hand against the door frame. Only your hand should be touching the frame. Keep the rest of your arm close to your side. Gently press the back of your hand against the door frame, as if you are trying to raise your arm out to the side (isometric shoulder abduction). Avoid shrugging your shoulder while you press your hand into the door frame. Keep your shoulder blade tucked down toward the middle of your back. Hold for 10 seconds. Slowly release the tension, and relax your muscles completely before you repeat the exercise. Repeat 10 times. Complete this exercise 3 times per week.      Internal rotation, isometric This is an exercise in which you press your palm against a door frame without moving your shoulder joint (isometric). Stand or sit in a doorway, facing the door frame. Bend your left / right elbow, and place the palm of your hand against the door frame. Only your palm should be touching the frame. Keep your upper arm at your side. Gently press your hand against the door frame, as if you are trying to push your arm toward your abdomen (internal rotation). Gradually increase the pressure until you are pressing as hard as you can. Stop increasing the pressure if you feel shoulder pain. Avoid shrugging your shoulder while you press your hand into the door frame. Keep your shoulder blade tucked down toward the middle of your back. Hold for 10 seconds. Slowly release the tension, and relax your muscles completely before you repeat the exercise. Repeat 10 times. Complete this exercise 3 times per week.      External rotation, isometric This is an exercise in which you press the back of your wrist against a door frame without moving your shoulder joint (isometric). Stand or sit in a doorway, facing the door frame. Bend your left / right elbow and place the back of your wrist against the door frame. Only the back of your wrist should be  touching the frame. Keep your upper arm at your side. Gently press your wrist against the door frame, as if you are trying to push your arm away from your abdomen (external rotation). Gradually increase the pressure until you are pressing as hard as you can. Stop increasing the pressure if you feel pain. Avoid shrugging your shoulder while  you press your wrist into the door frame. Keep your shoulder blade tucked down toward the middle of your back. Hold for 10 seconds. Slowly release the tension, and relax your muscles completely before you repeat the exercise. Repeat 10 times. Complete this exercise 3 times per week.       Scapular retraction Sit in a stable chair without armrests, or stand up. Secure an exercise band to a stable object in front of you so the band is at shoulder height. Hold one end of the exercise band in each hand. Your palms should face down. Squeeze your shoulder blades together (retraction) and move your elbows slightly behind you. Do not shrug your shoulders upward while you do this. Hold for 10 seconds. Slowly return to the starting position. Repeat 10 times. Complete this exercise 3 times per week.      Shoulder extension Sit in a stable chair without armrests, or stand up. Secure an exercise band to a stable object in front of you so the band is above shoulder height. Hold one end of the exercise band in each hand. Straighten your elbows and lift your hands up to shoulder height. Squeeze your shoulder blades together and pull your hands down to the sides of your thighs (extension). Stop when your hands are straight down by your sides. Do not let your hands go behind your body. Hold for 10 seconds. Slowly return to the starting position. Repeat 10 times. Complete this exercise 3 times per week.       Scapular protraction, supine Lie on your back on a firm surface (supine position). Hold a 5 lbs (or soup can) weight in your left / right hand. Raise your left  / right arm straight into the air so your hand is directly above your shoulder joint. Push the weight into the air so your shoulder (scapula) lifts off the surface that you are lying on. The scapula will push up or forward (protraction). Do not move your head, neck, or back. Hold for 10 seconds. Slowly return to the starting position. Let your muscles relax completely before you repeat this exercise.  Repeat 10 times. Complete this exercise 3 times per week.

## 2023-01-11 NOTE — Progress Notes (Signed)
New Patient Visit  Assessment: Shane Allison is a 57 y.o. male with the following: 1. Acute pain of left shoulder  Plan: Shane Allison has pain in the left shoulder.  Pain is primarily in the anterior shoulder.  Pain is recreated with superior spinatus strength testing, as well as stress on the long head of the biceps tendon.  No specific injury.  Radiographs are without degenerative changes or acute injury.  I have recommended a steroid injection.  He has elected to proceed.  Depending on the efficacy, we could also consider targeted injections using ultrasound.  He states understanding.  He will follow-up as needed.  Procedure note injection Left shoulder    Verbal consent was obtained to inject the left shoulder, subacromial space Timeout was completed to confirm the site of injection.  The skin was prepped with alcohol and ethyl chloride was sprayed at the injection site.  A 21-gauge needle was used to inject 40 mg of Depo-Medrol and 1% lidocaine (4 cc) into the subacromial space of the left shoulder using a posterolateral approach.  There were no complications. A sterile bandage was applied.   Follow-up: Return if symptoms worsen or fail to improve.  Subjective:  Chief Complaint  Patient presents with   Shoulder Pain    L shoulder pain for 1 mo no injury but pain getting worse    History of Present Illness: Shane Allison is a 57 y.o. male who has been referred by Delmar Landau, MD for evaluation of left shoulder pain.  He is right-hand dominant.  He said pain in the anterior aspect the left shoulder for the past month.  No specific injury.  He does not have a history of injury to his left shoulder.  He is not currently working.  He states he will do some work around the house, but nothing too strenuous.  He has been taking Tylenol.  No prior injections.  No physical therapy.  Pain gets worse at night.  He has difficulty with certain motions, including overhead  motions.   Review of Systems: No fevers or chills No numbness or tingling No chest pain No shortness of breath No bowel or bladder dysfunction No GI distress No headaches   Medical History:  Past Medical History:  Diagnosis Date   Abnormal result of iron profile testing 03/03/2016   AKI (acute kidney injury) (HCC) 05/10/2015   Alcohol use    Asthma    Cervical radiculopathy    Cervical spondylosis without myelopathy 05/14/2013   Chronic neck pain    COPD (chronic obstructive pulmonary disease) (HCC)    GERD (gastroesophageal reflux disease)    GI bleed 05/10/2015   Hypertension    Joint ache    Right AC joint separation   Numbness and tingling in hands 05/14/2013   Partial tear of rotator cuff 04/02/2013   Pneumonia    Psoriasis    Radicular pain in right arm 05/14/2013   Shortness of breath dyspnea     Past Surgical History:  Procedure Laterality Date   ACROMIO-CLAVICULAR JOINT REPAIR Right 10/26/2017   Procedure: RIGHT ACROMIO-CLAVICULAR JOINT RECONSTRUCTION;  Surgeon: Cammy Copa, MD;  Location: [facility] OR;  Service: Orthopedics;  Laterality: Right;   BIOPSY  05/12/2015   Procedure: BIOPSY;  Surgeon: West Bali, MD;  Location: [facility] SU[facility];  Service: Endoscopy;;  gastric biopsy   BIOPSY  03/15/2016   Procedure: BIOPSY;  Surgeon: West Bali, MD;  Location: [facility];  Service:  Endoscopy;;  random colon bx's   COLONOSCOPY WITH PROPOFOL N/A 03/15/2016   Procedure: COLONOSCOPY WITH PROPOFOL;  Surgeon: West Bali, MD;  Location: [facility];  Service: Endoscopy;  Laterality: N/A;  12:30 PM   ESOPHAGOGASTRODUODENOSCOPY (EGD) WITH PROPOFOL N/A 05/12/2015   Dr. Darrick Penna: normal esophagus, gastritis, bleeding friable duodenal mucosa   HAND TENDON SURGERY Right    MULTIPLE TOOTH EXTRACTIONS     POLYPECTOMY  03/15/2016   Procedure: POLYPECTOMY;  Surgeon: West Bali, MD;  Location: [facility];  Service: Endoscopy;;  rectal polyp   THUMB AMPUTATION      Partial left thumb     Family History  Problem Relation Age of Onset   COPD Mother    Colon cancer Neg Hx    Social History   Tobacco Use   Smoking status: Every Day    Current packs/day: 2.00    Average packs/day: 2.0 packs/day for 40.0 years (80.0 ttl pk-yrs)    Types: Cigarettes   Smokeless tobacco: Never   Tobacco comments:    Now smoking one pack a day 06/02/21  Vaping Use   Vaping status: Never Used  Substance Use Topics   Alcohol use: Yes    Comment: PATIENT REPORT 6 PACK OF BEER a day, last use x 2 days ago on 07/21/21   Drug use: No    Allergies  Allergen Reactions   Penicillins Rash    Has patient had a PCN reaction causing immediate rash, facial/tongue/throat swelling, SOB or lightheadedness with hypotension: Yes Has patient had a PCN reaction causing severe rash involving mucus membranes or skin necrosis: No Has patient had a PCN reaction that required hospitalization No Has patient had a PCN reaction occurring within the last 10 years: No If all of the above answers are "NO", then may proceed with Cephalosporin use.      Current Meds  Medication Sig   acetaminophen (TYLENOL) 500 MG tablet Take 500 mg by mouth every 6 (six) hours as needed. Takes as needed   amlodipine-olmesartan (AZOR) 10-20 MG tablet TAKE ONE (1) TABLET BY MOUTH EVERY DAY   cetirizine (ZYRTEC) 10 MG tablet Take 1 tablet by mouth daily.   meloxicam (MOBIC) 7.5 MG tablet Take 1 tablet (7.5 mg total) by mouth daily for 14 days.   STIOLTO RESPIMAT 2.5-2.5 MCG/ACT AERS INHALE TWO PUFFS INTO THE LUNGS DAILY   VENTOLIN HFA 108 (90 Base) MCG/ACT inhaler INHALE TWO PUFFS INTO THE LUNGS EVERY SIX HOURS AS NEEDED FOR WHEEZING OR SHORTNESS OF BREATH    Objective: Ht 5\' 7"  (1.702 m)   Wt 134 lb 12.8 oz (61.1 kg)   BMI 21.11 kg/m   Physical Exam:  General: Alert and oriented. and No acute distress. Gait: Normal gait.  Left shoulder without deformity.  He has excellent forward flexion to 160  degrees.  Internal rotation to T12.  45 degrees external rotation to side, similar to the contralateral side.  Tenderness palpation over the anterior shoulder and the biceps tendon.  Positive Jobe's.  Positive O'Brien's.  Strength is otherwise 5/5.  IMAGING: I personally ordered and reviewed the following images   X-ray left shoulder was obtained in clinic today.  No acute injuries are noted.  Minimal degenerative changes noted.  No evidence of proximal humeral migration.  No osteophytes.  No bony lesions.  Impression: Negative left shoulder x-ray   New Medications:  No orders of the defined types were placed in this encounter.     Loraine Leriche  Gwinda Maine, MD  01/11/2023 9:25 AM

## 2023-02-13 ENCOUNTER — Emergency Department (HOSPITAL_COMMUNITY): Payer: Medicaid Other

## 2023-02-13 ENCOUNTER — Other Ambulatory Visit: Payer: Self-pay

## 2023-02-13 ENCOUNTER — Observation Stay (HOSPITAL_COMMUNITY)
Admission: EM | Admit: 2023-02-13 | Discharge: 2023-02-14 | Disposition: A | Discharge status: Home / Self Care | Payer: Medicaid Other | Attending: Family Medicine | Admitting: Family Medicine

## 2023-02-13 ENCOUNTER — Encounter (HOSPITAL_COMMUNITY): Payer: Self-pay | Admitting: *Deleted

## 2023-02-13 DIAGNOSIS — R059 Cough, unspecified: Secondary | ICD-10-CM | POA: Diagnosis not present

## 2023-02-13 DIAGNOSIS — D649 Anemia, unspecified: Secondary | ICD-10-CM | POA: Diagnosis present

## 2023-02-13 DIAGNOSIS — D500 Iron deficiency anemia secondary to blood loss (chronic): Secondary | ICD-10-CM | POA: Insufficient documentation

## 2023-02-13 DIAGNOSIS — K219 Gastro-esophageal reflux disease without esophagitis: Secondary | ICD-10-CM | POA: Diagnosis not present

## 2023-02-13 DIAGNOSIS — F17210 Nicotine dependence, cigarettes, uncomplicated: Secondary | ICD-10-CM | POA: Insufficient documentation

## 2023-02-13 DIAGNOSIS — J449 Chronic obstructive pulmonary disease, unspecified: Secondary | ICD-10-CM | POA: Diagnosis present

## 2023-02-13 DIAGNOSIS — L409 Psoriasis, unspecified: Secondary | ICD-10-CM | POA: Diagnosis present

## 2023-02-13 DIAGNOSIS — Z1152 Encounter for screening for COVID-19: Secondary | ICD-10-CM | POA: Insufficient documentation

## 2023-02-13 DIAGNOSIS — Z720 Tobacco use: Secondary | ICD-10-CM | POA: Diagnosis not present

## 2023-02-13 DIAGNOSIS — J9601 Acute respiratory failure with hypoxia: Secondary | ICD-10-CM | POA: Insufficient documentation

## 2023-02-13 DIAGNOSIS — I10 Essential (primary) hypertension: Secondary | ICD-10-CM | POA: Diagnosis not present

## 2023-02-13 DIAGNOSIS — E871 Hypo-osmolality and hyponatremia: Secondary | ICD-10-CM | POA: Insufficient documentation

## 2023-02-13 DIAGNOSIS — F1010 Alcohol abuse, uncomplicated: Secondary | ICD-10-CM | POA: Diagnosis not present

## 2023-02-13 DIAGNOSIS — R7303 Prediabetes: Secondary | ICD-10-CM | POA: Diagnosis present

## 2023-02-13 DIAGNOSIS — R0602 Shortness of breath: Secondary | ICD-10-CM | POA: Diagnosis not present

## 2023-02-13 DIAGNOSIS — L419 Parapsoriasis, unspecified: Secondary | ICD-10-CM | POA: Insufficient documentation

## 2023-02-13 DIAGNOSIS — Z79899 Other long term (current) drug therapy: Secondary | ICD-10-CM | POA: Insufficient documentation

## 2023-02-13 DIAGNOSIS — J441 Chronic obstructive pulmonary disease with (acute) exacerbation: Secondary | ICD-10-CM | POA: Diagnosis not present

## 2023-02-13 LAB — BASIC METABOLIC PANEL
Anion gap: 5 (ref 5–15)
BUN: 12 mg/dL (ref 6–20)
CO2: 24 mmol/L (ref 22–32)
Calcium: 8.8 mg/dL — ABNORMAL LOW (ref 8.9–10.3)
Chloride: 99 mmol/L (ref 98–111)
Creatinine, Ser: 0.67 mg/dL (ref 0.61–1.24)
GFR, Estimated: >60 mL/min (ref >60)
Glucose, Bld: 99 mg/dL (ref 70–99)
Potassium: 3.9 mmol/L (ref 3.5–5.1)
Sodium: 128 mmol/L — ABNORMAL LOW (ref 135–145)

## 2023-02-13 LAB — CBC WITH DIFFERENTIAL/PLATELET
Abs Immature Granulocytes: 0.04 10*3/uL (ref 0.00–0.07)
Basophils Absolute: 0.1 10*3/uL (ref 0.0–0.1)
Basophils Relative: 1 %
Eosinophils Absolute: 0.1 10*3/uL (ref 0.0–0.5)
Eosinophils Relative: 1 %
HCT: 38.1 % — ABNORMAL LOW (ref 39.0–52.0)
Hemoglobin: 12.7 g/dL — ABNORMAL LOW (ref 13.0–17.0)
Immature Granulocytes: 1 %
Lymphocytes Relative: 19 %
Lymphs Abs: 1.6 10*3/uL (ref 0.7–4.0)
MCH: 33.1 pg (ref 26.0–34.0)
MCHC: 33.3 g/dL (ref 30.0–36.0)
MCV: 99.2 fL (ref 80.0–100.0)
Monocytes Absolute: 0.7 10*3/uL (ref 0.1–1.0)
Monocytes Relative: 9 %
Neutro Abs: 5.7 10*3/uL (ref 1.7–7.7)
Neutrophils Relative %: 69 %
Platelets: 301 10*3/uL (ref 150–400)
RBC: 3.84 MIL/uL — ABNORMAL LOW (ref 4.22–5.81)
RDW: 12.3 % (ref 11.5–15.5)
WBC: 8.1 10*3/uL (ref 4.0–10.5)
nRBC: 0 % (ref 0.0–0.2)

## 2023-02-13 LAB — RESP PANEL BY RT-PCR (RSV, FLU A&B, COVID)  RVPGX2
Influenza A by PCR: NEGATIVE
Influenza B by PCR: NEGATIVE
Resp Syncytial Virus by PCR: NEGATIVE
SARS Coronavirus 2 by RT PCR: NEGATIVE

## 2023-02-13 MED ORDER — IRBESARTAN 150 MG PO TABS
150.0000 mg | ORAL_TABLET | Freq: Every day | ORAL | Status: DC
  Administered 2023-02-14: 150 mg via ORAL
  Filled 2023-02-13: qty 1

## 2023-02-13 MED ORDER — ADULT MULTIVITAMIN W/MINERALS CH
1.0000 | ORAL_TABLET | Freq: Every day | ORAL | Status: DC
  Administered 2023-02-13 – 2023-02-14 (×2): 1 via ORAL
  Filled 2023-02-13 (×2): qty 1

## 2023-02-13 MED ORDER — FENTANYL CITRATE PF 50 MCG/ML IJ SOSY: 12.5000 ug | PREFILLED_SYRINGE | INTRAMUSCULAR | Status: DC | PRN

## 2023-02-13 MED ORDER — ONDANSETRON HCL 4 MG PO TABS: 4.0000 mg | ORAL_TABLET | Freq: Four times a day (QID) | ORAL | Status: DC | PRN

## 2023-02-13 MED ORDER — LORAZEPAM 1 MG PO TABS
1.0000 mg | ORAL_TABLET | ORAL | Status: DC | PRN
  Administered 2023-02-14: 1 mg via ORAL
  Filled 2023-02-13: qty 1

## 2023-02-13 MED ORDER — ENOXAPARIN SODIUM 40 MG/0.4ML IJ SOSY
40.0000 mg | PREFILLED_SYRINGE | INTRAMUSCULAR | Status: DC
  Administered 2023-02-13: 40 mg via SUBCUTANEOUS
  Filled 2023-02-13: qty 0.4

## 2023-02-13 MED ORDER — ALBUTEROL SULFATE (2.5 MG/3ML) 0.083% IN NEBU
2.5000 mg | INHALATION_SOLUTION | Freq: Once | RESPIRATORY_TRACT | Status: AC
  Administered 2023-02-13: 2.5 mg via RESPIRATORY_TRACT
  Filled 2023-02-13: qty 3

## 2023-02-13 MED ORDER — METHYLPREDNISOLONE SODIUM SUCC 40 MG IJ SOLR
40.0000 mg | Freq: Two times a day (BID) | INTRAMUSCULAR | Status: DC
  Administered 2023-02-13 – 2023-02-14 (×2): 40 mg via INTRAVENOUS
  Filled 2023-02-13 (×2): qty 1

## 2023-02-13 MED ORDER — CLOBETASOL PROPIONATE 0.05 % EX OINT
TOPICAL_OINTMENT | Freq: Two times a day (BID) | CUTANEOUS | Status: DC
  Filled 2023-02-13: qty 15

## 2023-02-13 MED ORDER — THIAMINE MONONITRATE 100 MG PO TABS
100.0000 mg | ORAL_TABLET | Freq: Every day | ORAL | Status: DC
  Administered 2023-02-13 – 2023-02-14 (×2): 100 mg via ORAL
  Filled 2023-02-13 (×2): qty 1

## 2023-02-13 MED ORDER — FOLIC ACID 1 MG PO TABS
1.0000 mg | ORAL_TABLET | Freq: Every day | ORAL | Status: DC
  Administered 2023-02-13 – 2023-02-14 (×2): 1 mg via ORAL
  Filled 2023-02-13 (×2): qty 1

## 2023-02-13 MED ORDER — THIAMINE HCL 100 MG/ML IJ SOLN: 100.0000 mg | Freq: Every day | INTRAMUSCULAR | Status: DC

## 2023-02-13 MED ORDER — CHLORDIAZEPOXIDE HCL 25 MG PO CAPS: 25.0000 mg | ORAL_CAPSULE | ORAL | Status: DC

## 2023-02-13 MED ORDER — CHLORDIAZEPOXIDE HCL 5 MG PO CAPS: 10.0000 mg | ORAL_CAPSULE | ORAL | Status: DC

## 2023-02-13 MED ORDER — CHLORDIAZEPOXIDE HCL 5 MG PO CAPS
10.0000 mg | ORAL_CAPSULE | Freq: Three times a day (TID) | ORAL | Status: DC
  Administered 2023-02-14: 10 mg via ORAL
  Filled 2023-02-13: qty 2

## 2023-02-13 MED ORDER — PANTOPRAZOLE SODIUM 40 MG PO TBEC
40.0000 mg | DELAYED_RELEASE_TABLET | Freq: Every evening | ORAL | Status: DC
  Administered 2023-02-13: 40 mg via ORAL
  Filled 2023-02-13: qty 1

## 2023-02-13 MED ORDER — ALBUTEROL SULFATE (2.5 MG/3ML) 0.083% IN NEBU
5.0000 mg | INHALATION_SOLUTION | Freq: Once | RESPIRATORY_TRACT | Status: AC
  Administered 2023-02-13: 5 mg via RESPIRATORY_TRACT
  Filled 2023-02-13: qty 6

## 2023-02-13 MED ORDER — LOPERAMIDE HCL 2 MG PO CAPS: 2.0000 mg | ORAL_CAPSULE | ORAL | Status: DC | PRN

## 2023-02-13 MED ORDER — DOXYCYCLINE HYCLATE 100 MG PO TABS
100.0000 mg | ORAL_TABLET | Freq: Two times a day (BID) | ORAL | Status: DC
  Administered 2023-02-13 – 2023-02-14 (×2): 100 mg via ORAL
  Filled 2023-02-13 (×2): qty 1

## 2023-02-13 MED ORDER — ONDANSETRON HCL 4 MG/2ML IJ SOLN: 4.0000 mg | Freq: Four times a day (QID) | INTRAMUSCULAR | Status: DC | PRN

## 2023-02-13 MED ORDER — BISACODYL 5 MG PO TBEC: 5.0000 mg | DELAYED_RELEASE_TABLET | Freq: Every day | ORAL | Status: DC | PRN

## 2023-02-13 MED ORDER — TRAZODONE HCL 50 MG PO TABS
25.0000 mg | ORAL_TABLET | Freq: Every evening | ORAL | Status: DC | PRN
Start: 2023-02-13 — End: 2023-02-14
  Administered 2023-02-14: 25 mg via ORAL
  Filled 2023-02-13: qty 1

## 2023-02-13 MED ORDER — AMLODIPINE BESYLATE 5 MG PO TABS
10.0000 mg | ORAL_TABLET | Freq: Every day | ORAL | Status: DC
  Administered 2023-02-14: 10 mg via ORAL
  Filled 2023-02-13: qty 2

## 2023-02-13 MED ORDER — CHLORDIAZEPOXIDE HCL 5 MG PO CAPS: 10.0000 mg | ORAL_CAPSULE | Freq: Every day | ORAL | Status: DC

## 2023-02-13 MED ORDER — IPRATROPIUM-ALBUTEROL 0.5-2.5 (3) MG/3ML IN SOLN
3.0000 mL | RESPIRATORY_TRACT | Status: DC
  Administered 2023-02-13 (×2): 3 mL via RESPIRATORY_TRACT
  Filled 2023-02-13 (×2): qty 3

## 2023-02-13 MED ORDER — ALBUTEROL SULFATE (2.5 MG/3ML) 0.083% IN NEBU: 2.5000 mg | INHALATION_SOLUTION | RESPIRATORY_TRACT | Status: DC | PRN

## 2023-02-13 MED ORDER — GUAIFENESIN ER 600 MG PO TB12
1200.0000 mg | ORAL_TABLET | Freq: Two times a day (BID) | ORAL | Status: DC
  Administered 2023-02-13 – 2023-02-14 (×3): 1200 mg via ORAL
  Filled 2023-02-13 (×3): qty 2

## 2023-02-13 MED ORDER — NICOTINE 21 MG/24HR TD PT24
21.0000 mg | MEDICATED_PATCH | Freq: Every day | TRANSDERMAL | Status: DC
  Administered 2023-02-13 – 2023-02-14 (×2): 21 mg via TRANSDERMAL
  Filled 2023-02-13 (×2): qty 1

## 2023-02-13 MED ORDER — CHLORDIAZEPOXIDE HCL 25 MG PO CAPS: 25.0000 mg | ORAL_CAPSULE | Freq: Every day | ORAL | Status: DC

## 2023-02-13 MED ORDER — LORAZEPAM 2 MG/ML IJ SOLN: 0.0000 mg | Freq: Two times a day (BID) | INTRAMUSCULAR | Status: DC

## 2023-02-13 MED ORDER — CHLORDIAZEPOXIDE HCL 5 MG PO CAPS
10.0000 mg | ORAL_CAPSULE | Freq: Four times a day (QID) | ORAL | Status: AC
  Administered 2023-02-13 (×2): 10 mg via ORAL
  Filled 2023-02-13 (×2): qty 2

## 2023-02-13 MED ORDER — DEXTROMETHORPHAN POLISTIREX ER 30 MG/5ML PO SUER: 30.0000 mg | Freq: Two times a day (BID) | ORAL | Status: DC | PRN

## 2023-02-13 MED ORDER — ENSURE ENLIVE PO LIQD
237.0000 mL | Freq: Two times a day (BID) | ORAL | Status: DC
  Administered 2023-02-14: 237 mL via ORAL

## 2023-02-13 MED ORDER — HYDROXYZINE HCL 25 MG PO TABS: 25.0000 mg | ORAL_TABLET | Freq: Four times a day (QID) | ORAL | Status: DC | PRN

## 2023-02-13 MED ORDER — LORAZEPAM 2 MG/ML IJ SOLN
1.0000 mg | INTRAMUSCULAR | Status: DC | PRN
  Administered 2023-02-14 (×2): 2 mg via INTRAVENOUS
  Filled 2023-02-13 (×2): qty 1

## 2023-02-13 MED ORDER — IPRATROPIUM BROMIDE 0.02 % IN SOLN
0.5000 mg | Freq: Once | RESPIRATORY_TRACT | Status: AC
  Administered 2023-02-13: 0.5 mg via RESPIRATORY_TRACT
  Filled 2023-02-13: qty 2.5

## 2023-02-13 MED ORDER — IPRATROPIUM-ALBUTEROL 0.5-2.5 (3) MG/3ML IN SOLN
3.0000 mL | Freq: Three times a day (TID) | RESPIRATORY_TRACT | Status: DC
  Administered 2023-02-14 (×2): 3 mL via RESPIRATORY_TRACT
  Filled 2023-02-13 (×2): qty 3

## 2023-02-13 MED ORDER — AMLODIPINE-OLMESARTAN 10-20 MG PO TABS: 1.0000 | ORAL_TABLET | Freq: Every day | ORAL | Status: DC

## 2023-02-13 MED ORDER — CHLORDIAZEPOXIDE HCL 25 MG PO CAPS: 25.0000 mg | ORAL_CAPSULE | Freq: Four times a day (QID) | ORAL | Status: DC

## 2023-02-13 MED ORDER — OXYCODONE HCL 5 MG PO TABS
5.0000 mg | ORAL_TABLET | Freq: Four times a day (QID) | ORAL | Status: DC | PRN
  Administered 2023-02-13: 5 mg via ORAL
  Filled 2023-02-13: qty 1

## 2023-02-13 MED ORDER — CHLORDIAZEPOXIDE HCL 25 MG PO CAPS: 25.0000 mg | ORAL_CAPSULE | Freq: Three times a day (TID) | ORAL | Status: DC

## 2023-02-13 MED ORDER — METHYLPREDNISOLONE SODIUM SUCC 125 MG IJ SOLR
125.0000 mg | Freq: Once | INTRAMUSCULAR | Status: AC
  Administered 2023-02-13: 125 mg via INTRAVENOUS
  Filled 2023-02-13: qty 2

## 2023-02-13 MED ORDER — LORAZEPAM 2 MG/ML IJ SOLN: 0.0000 mg | Freq: Four times a day (QID) | INTRAMUSCULAR | Status: DC

## 2023-02-13 NOTE — Plan of Care (Signed)
  Problem: Education: Goal: Knowledge of General Education information will improve Description Including pain rating scale, medication(s)/side effects and non-pharmacologic comfort measures Outcome: Progressing   Problem: Education: Goal: Knowledge of General Education information will improve Description Including pain rating scale, medication(s)/side effects and non-pharmacologic comfort measures Outcome: Progressing   

## 2023-02-13 NOTE — Hospital Course (Signed)
57 year old gentleman with history of chronic alcohol use, psoriasis, cervical radiculopathy, asthma, COPD, GERD, hypertension, smoker reportedly was supposed to go to [facility] dermatology for an appointment today for his uncontrolled psoriasis that has not been treated in over 1 year.  He reportedly was too short of breath this morning and presented to the ED for care.  He reports that his psoriasis has worsened since he stopped taking the immunosuppressant injection medication Nelson Chimes) that he had been on in the past that is no longer covered by Medicaid.  He is complaining of cough myalgias and shortness of breath.  His symptoms began yesterday.  He says that he normally takes albuterol and Stiolto but has run out of the medications.  He reports chest tightness wheezing coughing that is nonproductive and congestion.  He denies having fever and chills.  He complains of foul taste in the mouth and blurry vision and postnasal sinus drainage.  He was started on treatment for COPD exacerbation in the emergency department.  He initially improved with therapies however when they attempted to get him up to walk to the bathroom he desaturated to 76% pulse ox on room air and has required continuous supplemental oxygen therapy.  He reports that he does not take oxygen at home.  He is being admitted for acute COPD exacerbation.

## 2023-02-13 NOTE — ED Triage Notes (Signed)
Pt c/o SOB and dry cough that started late last night. Pt also c/o psoriasis that worsened over the last week. He reports that he has an upcoming appt to hopefully get back on his injections to help his psoriasis. He reports blurry vision and a foul taste and is concerned it has now spread to his eyes.

## 2023-02-13 NOTE — H&P (Addendum)
History and Physical  [facility]  TREG NANNY OZD:[id number] DOB: 1966-01-18 DOA: 02/13/2023  PCP: Billie Lade, MD  Patient coming from: Home  Level of care: Med-Surg  I have personally briefly reviewed patient's old medical records in [facility]  Chief Complaint: SOB   HPI: Shane Allison is a 57 year old gentleman with history of chronic alcohol use, psoriasis, cervical radiculopathy, asthma, COPD, GERD, hypertension, smoker reportedly was supposed to go to [facility] dermatology for an appointment today for his uncontrolled psoriasis that has not been treated in over 1 year.  He reportedly was too short of breath this morning and presented to the ED for care.  He reports that his psoriasis has worsened since he stopped taking the immunosuppressant injection medication Nelson Chimes) that he had been on in the past that is no longer covered by Medicaid.  He is complaining of cough myalgias and shortness of breath.  His symptoms began yesterday.  He says that he normally takes albuterol and Stiolto but has run out of the medications.  He reports chest tightness wheezing coughing that is nonproductive and congestion.  He denies having fever and chills.  He complains of foul taste in the mouth and blurry vision and postnasal sinus drainage.  He was started on treatment for COPD exacerbation in the emergency department.  He initially improved with therapies however when they attempted to get him up to walk to the bathroom he desaturated to 76% pulse ox on room air and has required continuous supplemental oxygen therapy.  He reports that he does not take oxygen at home.  He is being admitted for acute COPD exacerbation.  Past Medical History:  Diagnosis Date   Abnormal result of iron profile testing 03/03/2016   AKI (acute kidney injury) (HCC) 05/10/2015   Alcohol use    Asthma    Cervical radiculopathy    Cervical spondylosis without myelopathy 05/14/2013   Chronic neck pain     COPD (chronic obstructive pulmonary disease) (HCC)    GERD (gastroesophageal reflux disease)    GI bleed 05/10/2015   Hypertension    Joint ache    Right AC joint separation   Numbness and tingling in hands 05/14/2013   Partial tear of rotator cuff 04/02/2013   Pneumonia    Psoriasis    Radicular pain in right arm 05/14/2013   Shortness of breath dyspnea     Past Surgical History:  Procedure Laterality Date   ACROMIO-CLAVICULAR JOINT REPAIR Right 10/26/2017   Procedure: RIGHT ACROMIO-CLAVICULAR JOINT RECONSTRUCTION;  Surgeon: Cammy Copa, MD;  Location: [facility] OR;  Service: Orthopedics;  Laterality: Right;   BIOPSY  05/12/2015   Procedure: BIOPSY;  Surgeon: West Bali, MD;  Location: [facility];  Service: Endoscopy;;  gastric biopsy   BIOPSY  03/15/2016   Procedure: BIOPSY;  Surgeon: West Bali, MD;  Location: [facility];  Service: Endoscopy;;  random colon bx's   COLONOSCOPY WITH PROPOFOL N/A 03/15/2016   Procedure: COLONOSCOPY WITH PROPOFOL;  Surgeon: West Bali, MD;  Location: [facility];  Service: Endoscopy;  Laterality: N/A;  12:30 PM   ESOPHAGOGASTRODUODENOSCOPY (EGD) WITH PROPOFOL N/A 05/12/2015   Dr. Darrick Penna: normal esophagus, gastritis, bleeding friable duodenal mucosa   HAND TENDON SURGERY Right    MULTIPLE TOOTH EXTRACTIONS     POLYPECTOMY  03/15/2016   Procedure: POLYPECTOMY;  Surgeon: West Bali, MD;  Location: [facility];  Service: Endoscopy;;  rectal polyp   THUMB  AMPUTATION     Partial left thumb      reports that he has been smoking cigarettes. He has a 40 pack-year smoking history. He has never used smokeless tobacco. He reports current alcohol use. He reports that he does not use drugs.  Allergies  Allergen Reactions   Penicillins Rash    immediate rash, facial/tongue/throat swelling, SOB or lightheadedness with hypotension    Family History  Problem Relation Age of Onset   COPD Mother    Colon cancer Neg Hx     Prior to  Admission medications   Medication Sig Start Date End Date Taking? Authorizing Provider  acetaminophen (TYLENOL) 500 MG tablet Take 500 mg by mouth every 6 (six) hours as needed. Takes as needed    [provider]  amlodipine-olmesartan (AZOR) 10-20 MG tablet TAKE ONE (1) TABLET BY MOUTH EVERY DAY 10/10/22   Billie Lade, MD  cetirizine (ZYRTEC) 10 MG tablet Take 1 tablet by mouth daily. 03/01/21   [provider]  STIOLTO RESPIMAT 2.5-2.5 MCG/ACT AERS INHALE TWO PUFFS INTO THE LUNGS DAILY 12/28/22   Billie Lade, MD  VENTOLIN HFA 108 [phone] Base) MCG/ACT inhaler INHALE TWO PUFFS INTO THE LUNGS EVERY SIX HOURS AS NEEDED FOR WHEEZING OR SHORTNESS OF BREATH 08/31/22   Billie Lade, MD    Physical Exam: Vitals:   02/13/23 1300 02/13/23 1422 02/13/23 1426 02/13/23 1530  BP: (!) 142/82 (!) 122/93    Pulse: 70 78    Resp: (!) 23 20    Temp:  98.3 F (36.8 C)    TempSrc:  Oral    SpO2: 100% 98%  98%  Weight:   59.7 kg   Height:       Constitutional: NAD, calm, comfortable Eyes: PERRL, lids and conjunctivae normal ENMT: Mucous membranes are moist. Posterior pharynx clear of any exudate or lesions.Normal dentition.  Neck: normal, supple, no masses, no thyromegaly Respiratory:diffuse wheezing and rales heard, no crackles. Moderate increased work of breathing. No accessory muscle use.  Cardiovascular: normal s1, s2 sounds, no murmurs / rubs / gallops. No extremity edema. 2+ pedal pulses. No carotid bruits.  Abdomen: no tenderness, no masses palpated. No hepatosplenomegaly. Bowel sounds positive.  Musculoskeletal: no clubbing / cyanosis. No joint deformity upper and lower extremities. Good ROM, no contractures. Normal muscle tone.  Skin: severe psoriatic dermatitis on trunk, waist and legs; bright red dermatitis of skin noted with psoriatic features seen.  Neurologic: CN 2-12 grossly intact. Sensation intact, DTR normal. Strength 5/5 in all 4.  Psychiatric: Normal judgment  and insight. Alert and oriented x 3. Normal mood.   Labs on Admission: I have personally reviewed following labs and imaging studies  CBC: Recent Labs  Lab 02/13/23 0902  WBC 8.1  NEUTROABS 5.7  HGB 12.7*  HCT 38.1*  MCV 99.2  PLT 301   Basic Metabolic Panel: Recent Labs  Lab 02/13/23 0902  NA 128*  K 3.9  CL 99  CO2 24  GLUCOSE 99  BUN 12  CREATININE 0.67  CALCIUM 8.8*   GFR: Estimated Creatinine Clearance: 88.1 mL/min (by C-G formula based on SCr of 0.67 mg/dL). Liver Function Tests: No results for input(s): "AST", "ALT", "ALKPHOS", "BILITOT", "PROT", "ALBUMIN" in the last 168 hours. No results for input(s): "LIPASE", "AMYLASE" in the last 168 hours. No results for input(s): "AMMONIA" in the last 168 hours. Coagulation Profile: No results for input(s): "INR", "PROTIME" in the last 168 hours. Cardiac Enzymes: No results for input(s): "CKTOTAL", "  CKMB", "CKMBINDEX", "TROPONINI" in the last 168 hours. BNP (last 3 results) No results for input(s): "PROBNP" in the last 8760 hours. HbA1C: No results for input(s): "HGBA1C" in the last 72 hours. CBG: No results for input(s): "GLUCAP" in the last 168 hours. Lipid Profile: No results for input(s): "CHOL", "HDL", "LDLCALC", "TRIG", "CHOLHDL", "LDLDIRECT" in the last 72 hours. Thyroid Function Tests: No results for input(s): "TSH", "T4TOTAL", "FREET4", "T3FREE", "THYROIDAB" in the last 72 hours. Anemia Panel: No results for input(s): "VITAMINB12", "FOLATE", "FERRITIN", "TIBC", "IRON", "RETICCTPCT" in the last 72 hours. Urine analysis:    Component Value Date/Time   COLORURINE YELLOW 05/10/2015 2048   APPEARANCEUR CLEAR 05/10/2015 2048   LABSPEC 1.015 05/10/2015 2048   PHURINE 5.0 05/10/2015 2048   GLUCOSEU NEGATIVE 05/10/2015 2048   HGBUR NEGATIVE 05/10/2015 2048   BILIRUBINUR NEGATIVE 05/10/2015 2048   KETONESUR NEGATIVE 05/10/2015 2048   PROTEINUR NEGATIVE 05/10/2015 2048   UROBILINOGEN 0.2 05/30/2012 1447    NITRITE NEGATIVE 05/10/2015 2048   LEUKOCYTESUR NEGATIVE 05/10/2015 2048    Radiological Exams on Admission: DG Chest Port 1 View Result Date: 02/13/2023 CLINICAL DATA:  Cough, short of breath, psoriasis EXAM: PORTABLE CHEST 1 VIEW COMPARISON:  05/10/2015 FINDINGS: 2 frontal views of the chest demonstrate an unremarkable cardiac silhouette. No acute airspace disease, effusion, or pneumothorax. No acute bony abnormalities. IMPRESSION: 1. No acute intrathoracic process. Electronically Signed   By: Sharlet Salina M.D.   On: 02/13/2023 09:07    EKG: Independently reviewed.  Assessment/Plan Principal Problem:   COPD with acute exacerbation (HCC) Active Problems:   Alcohol abuse   COPD (chronic obstructive pulmonary disease) (HCC)   Normocytic anemia   GERD (gastroesophageal reflux disease)   Tobacco abuse   Hypertension, essential   Psoriasis   Prediabetes   Acute respiratory failure with hypoxia -Secondary to COPD exacerbation -Continue IV steroids as ordered -Continue scheduled bronchodilators as ordered -Doxycycline 100 mg p.o. twice daily -Mucinex 1200 mg twice daily -Delsym cough syrup twice daily -Supplemental oxygen as ordered for pulse ox greater than 88% -Encouraged tobacco cessation  Essential hypertension  - resume home medications when reconciled  Chronic alcoholism  - pt reports drinking daily 6 pack or less - pt currently denies DTs history - continue CIWA protocol as ordered - librium taper ordered  - supplement vitamins thiamine, folic acid  GERD  - pantoprazole ordered for GI protection  Psoriasis - untreated x over 1 years  - it is severely out of control - pt strongly advised to reschedule appt with [facility] dermatology - he is on IV steroids now which could help - topical clobetasol ointment for severe symptoms, avoiding groin, face, axillae  Tobacco use - nicotine patch 21 mg daily ordered - encouraged tobacco cessation   Chronic  hyponatremia - stable from previous tests   DVT prophylaxis: enoxaparin   Code Status: full   Family Communication:   Disposition Plan: home   Consults called:   Admission status: OBV   Level of care: Med-Surg Standley Dakins MD [facility] How to contact the [facility] Attending or Consulting provider 7A - 7P or covering provider during after hours 7P -7A, for this patient?  Check the care team in [facility] and look for a) attending/consulting [facility] provider listed and b) the [facility] team listed Log into [email or web address] and use [place]'s universal password to access. If you do not have the password, please contact the hospital operator. Locate the [facility] provider you are looking for under [facility] and  page to a number that you can be directly reached. If you still have difficulty reaching the provider, please page the [facility] (Director on Call) for the Hospitalists listed on amion for assistance.   If 7PM-7AM, please contact night-coverage [email or web address] Password [facility]  02/13/2023, 4:13 PM

## 2023-02-13 NOTE — ED Notes (Signed)
Pt assisted to bsc due to inability to tolerate ambulating to bathroom.

## 2023-02-13 NOTE — ED Notes (Signed)
Attempted to ambulate pt with O2 and sat dropped to 76%. Pt assisted back to bed.

## 2023-02-13 NOTE — ED Provider Notes (Signed)
[place] EMERGENCY DEPARTMENT AT [facility] Provider Note   CSN: [id number] Arrival date & time: 02/13/23  2952     History  Chief Complaint  Patient presents with   Shortness of Breath   Rash    Shane Allison is a 57 y.o. male.   Shortness of Breath Associated symptoms: cough and rash   Associated symptoms: no abdominal pain, no chest pain, no fever, no headaches, no neck pain and no vomiting   Rash Associated symptoms: myalgias and shortness of breath   Associated symptoms: no abdominal pain, no diarrhea, no fever, no headaches, no nausea and not vomiting        Shane Allison is a 57 y.o. male with past medical history of hypertension, psoriasis, anemia, COPD asthma, alcohol use who presents to the Emergency Department complaining of shortness of breath and cough.  Symptoms began yesterday.  Ran out of his albuterol and Stiolto yesterday.  Describes cough is mostly nonproductive.  Chest tightness associated with coughing, denies pain.  No fever or chills.  He also reports significant flare of his psoriasis.  He states it is covered most of his body and he is having burning pain to his torso area.  He also complains of a foul taste in his mouth and blurry vision.  He is concerned that the psoriasis has spread into his eyes.  He was taking Toltz for his psoriasis and symptoms were well managed, but states Medicaid stopped paying for the medication when he missed an appointment for a TB test.  He has not had any medication for psoriasis in over 1 year.  States he had an appointment with his dermatologist today in [place], but did not feel that he could make the appointment with his shortness of breath today.  Home Medications Prior to Admission medications   Medication Sig Start Date End Date Taking? Authorizing Provider  acetaminophen (TYLENOL) 500 MG tablet Take 500 mg by mouth every 6 (six) hours as needed. Takes as needed    [provider]   amlodipine-olmesartan (AZOR) 10-20 MG tablet TAKE ONE (1) TABLET BY MOUTH EVERY DAY 10/10/22   Billie Lade, MD  cetirizine (ZYRTEC) 10 MG tablet Take 1 tablet by mouth daily. 03/01/21   [provider]  STIOLTO RESPIMAT 2.5-2.5 MCG/ACT AERS INHALE TWO PUFFS INTO THE LUNGS DAILY 12/28/22   Billie Lade, MD  VENTOLIN HFA 108 [phone] Base) MCG/ACT inhaler INHALE TWO PUFFS INTO THE LUNGS EVERY SIX HOURS AS NEEDED FOR WHEEZING OR SHORTNESS OF BREATH 08/31/22   Billie Lade, MD      Allergies    Penicillins    Review of Systems   Review of Systems  Constitutional:  Negative for appetite change, chills and fever.  HENT:  Negative for congestion.   Eyes:  Positive for pain.  Respiratory:  Positive for cough, chest tightness and shortness of breath.   Cardiovascular:  Negative for chest pain and leg swelling.  Gastrointestinal:  Negative for abdominal pain, diarrhea, nausea and vomiting.  Genitourinary:  Negative for difficulty urinating.  Musculoskeletal:  Positive for myalgias. Negative for back pain and neck pain.  Skin:  Positive for rash.  Neurological:  Negative for dizziness, facial asymmetry, weakness, numbness and headaches.    Physical Exam Updated Vital Signs BP (!) 146/90   Pulse 75   Temp 98.8 F (37.1 C) (Oral)   Resp 18   Ht 5\' 7"  (1.702 m)   Wt 61.1 kg  SpO2 97%   BMI 21.10 kg/m  Physical Exam Vitals and nursing note reviewed.  Constitutional:      General: He is not in acute distress.    Appearance: He is well-developed. He is not toxic-appearing.     Comments: Patient uncomfortable appearing  HENT:     Head: Atraumatic.     Mouth/Throat:     Mouth: Mucous membranes are moist.     Pharynx: Oropharynx is clear.  Eyes:     Extraocular Movements: Extraocular movements intact.     Conjunctiva/sclera: Conjunctivae normal.     Pupils: Pupils are equal, round, and reactive to light.  Cardiovascular:     Rate and Rhythm: Normal rate and regular  rhythm.     Pulses: Normal pulses.  Pulmonary:     Breath sounds: No wheezing, rhonchi or rales.     Comments: Patient has slight increased work of breathing on exam.  Slightly diminished lung sounds bilaterally.  No wheezing or rales.  He is on 3 L O2 by nasal cannula Abdominal:     Palpations: Abdomen is soft.     Tenderness: There is no abdominal tenderness.  Musculoskeletal:     Cervical back: Normal range of motion.     Right lower leg: No edema.     Left lower leg: No edema.  Lymphadenopathy:     Cervical: No cervical adenopathy.  Skin:    General: Skin is warm.     Capillary Refill: Capillary refill takes less than 2 seconds.     Findings: Rash present.     Comments: Psoriasis plaques to most areas of the body, including trunk, wrist, knees, arms.  Face is spared.  Neurological:     General: No focal deficit present.     Mental Status: He is alert.     Sensory: No sensory deficit.     Motor: No weakness.     ED Results / Procedures / Treatments   Labs (all labs ordered are listed, but only abnormal results are displayed) Labs Reviewed  BASIC METABOLIC PANEL - Abnormal; Notable for the following components:      Result Value   Sodium 128 (*)    Calcium 8.8 (*)    All other components within normal limits  CBC WITH DIFFERENTIAL/PLATELET - Abnormal; Notable for the following components:   RBC 3.84 (*)    Hemoglobin 12.7 (*)    HCT 38.1 (*)    All other components within normal limits  RESP PANEL BY RT-PCR (RSV, FLU A&B, COVID)  [id number]    EKG EKG Interpretation Date/Time:  Monday February 13 2023 08:28:26 EST Ventricular Rate:  76 PR Interval:  155 QRS Duration:  86 QT Interval:  389 QTC Calculation: 438 R Axis:   82  Text Interpretation: Sinus rhythm Confirmed by Gerhard Munch [phone]) on 02/13/2023 8:31:55 AM  Radiology DG Chest Port 1 View Result Date: 02/13/2023 CLINICAL DATA:  Cough, short of breath, psoriasis EXAM: PORTABLE CHEST 1 VIEW COMPARISON:   05/10/2015 FINDINGS: 2 frontal views of the chest demonstrate an unremarkable cardiac silhouette. No acute airspace disease, effusion, or pneumothorax. No acute bony abnormalities. IMPRESSION: 1. No acute intrathoracic process. Electronically Signed   By: Sharlet Salina M.D.   On: 02/13/2023 09:07    Procedures Procedures    Medications Ordered in ED Medications  ipratropium (ATROVENT) nebulizer solution 0.5 mg (has no administration in time range)  albuterol (PROVENTIL) (2.5 MG/3ML) 0.083% nebulizer solution 5 mg (has no administration in time range)  methylPREDNISolone sodium succinate (SOLU-MEDROL) 125 mg/2 mL injection 125 mg (has no administration in time range)    ED Course/ Medical Decision Making/ A&P                                 Medical Decision Making Patient with past medical history of COPD and asthma here from home for evaluation of shortness of breath, chest tightness and cough.  Hypoxic on arrival.  Symptoms began yesterday.  Ran out of his inhalers today.  Chest tightness associated with coughing only.  Denies any chest pain or fever.  Also complains of bodyaches, blurred vision and felt taste in his mouth for few weeks.  Has history of significant psoriasis has been nonmedicated for over 1 year.  Denies headache or dizziness.  No neck pain or stiffness.  No history of supplemental oxygen use, patient was hypoxic on arrival with sats in the mid 80s.  He was placed on 3 L O2 on arrival to exam room and sats now in the mid 90s  I suspect this is a COPD flare likely secondary from not having his antiasthmatic medications.  Pneumonia, PE, viral process all also considered.  Rash consistent with psoriasis flare  Amount and/or Complexity of Data Reviewed Labs: ordered.    Details: Labs interpreted by me, mild hyponatremia otherwise unremarkable Radiology: ordered.    Details: Chest x-ray no acute abnormality ECG/medicine tests: ordered.    Details: EKG shows sinus  rhythm Discussion of management or test interpretation with external provider(s): Suspected COPD exacerbation.  Hypoxic on arrival.  Does not use home oxygen continues to smoke.  Given Solu-Medrol and nebs here.  Patient resting comfortably on recheck, lung sounds much improved.  Has tolerated oral fluids.  Psoriasis symptoms improved after Solu-Medrol as well.  Will ambulated in the department if no longer hypoxic, may be discharged home.  Patient attempted ambulation in the room unable to make more than just a few steps without becoming hypoxic, sats dropped into the lower 80s with standing.  Placed back on oxygen and at rest sats improved to mid 90s.  COPD exacerbation with hypoxia.  Will consult with [facility]ist for admission.  Discussed findings with [facility] hospitalist, Dr. Laural Benes who agrees to admit  Risk Prescription drug management. Decision regarding hospitalization.           Final Clinical Impression(s) / ED Diagnoses Final diagnoses:  COPD exacerbation [facility])    Rx / DC Orders ED Discharge Orders     None         Pauline Aus, PA-C 02/13/23 1259    Gerhard Munch, MD 02/13/23 1506

## 2023-02-14 DIAGNOSIS — L409 Psoriasis, unspecified: Secondary | ICD-10-CM | POA: Diagnosis not present

## 2023-02-14 DIAGNOSIS — J441 Chronic obstructive pulmonary disease with (acute) exacerbation: Secondary | ICD-10-CM | POA: Diagnosis not present

## 2023-02-14 DIAGNOSIS — Z720 Tobacco use: Secondary | ICD-10-CM | POA: Diagnosis not present

## 2023-02-14 DIAGNOSIS — F1010 Alcohol abuse, uncomplicated: Secondary | ICD-10-CM | POA: Diagnosis not present

## 2023-02-14 DIAGNOSIS — I10 Essential (primary) hypertension: Secondary | ICD-10-CM | POA: Diagnosis not present

## 2023-02-14 LAB — MAGNESIUM: Magnesium: 2.1 mg/dL (ref 1.7–2.4)

## 2023-02-14 LAB — HIV ANTIBODY (ROUTINE TESTING W REFLEX): HIV Screen 4th Generation wRfx: NONREACTIVE

## 2023-02-14 MED ORDER — ADULT MULTIVITAMIN W/MINERALS CH: 1.0000 | ORAL_TABLET | Freq: Every day | ORAL | Status: AC

## 2023-02-14 MED ORDER — GUAIFENESIN ER 600 MG PO TB12: 1200.0000 mg | ORAL_TABLET | Freq: Two times a day (BID) | ORAL | 0 refills | Status: AC

## 2023-02-14 MED ORDER — FOLIC ACID 1 MG PO TABS: 1.0000 mg | ORAL_TABLET | Freq: Every day | ORAL | 1 refills | Status: AC

## 2023-02-14 MED ORDER — VITAMIN B-1 100 MG PO TABS: 100.0000 mg | ORAL_TABLET | Freq: Every day | ORAL | 1 refills | Status: AC

## 2023-02-14 MED ORDER — STIOLTO RESPIMAT 2.5-2.5 MCG/ACT IN AERS: 2.0000 | INHALATION_SPRAY | Freq: Every day | RESPIRATORY_TRACT | 3 refills | Status: AC

## 2023-02-14 MED ORDER — DOXYCYCLINE HYCLATE 100 MG PO TABS: 100.0000 mg | ORAL_TABLET | Freq: Two times a day (BID) | ORAL | 0 refills | Status: AC

## 2023-02-14 MED ORDER — VENTOLIN HFA 108 (90 BASE) MCG/ACT IN AERS: 2.0000 | INHALATION_SPRAY | RESPIRATORY_TRACT | 3 refills | Status: DC | PRN

## 2023-02-14 MED ORDER — DEXTROMETHORPHAN POLISTIREX ER 30 MG/5ML PO SUER: 30.0000 mg | Freq: Two times a day (BID) | ORAL | 0 refills | Status: AC | PRN

## 2023-02-14 MED ORDER — PREDNISONE 20 MG PO TABS: ORAL_TABLET | ORAL | 0 refills | Status: DC

## 2023-02-14 NOTE — Discharge Summary (Signed)
Physician Discharge Summary  Shane Allison VHQ:[id number] DOB: 1965-08-27 DOA: 02/13/2023  PCP: Billie Lade, MD  Admit date: 02/13/2023 Discharge date: 02/14/2023  Admitted From:  Home  Disposition: Home   Recommendations for Outpatient Follow-up:  Follow up with PCP in 2 weeks Follow up with [facility] dermatology in 1-2 weeks Smoking and alcohol cessation strongly advised   Discharge Condition: STABLE   CODE STATUS: FULL  DIET: regular    Brief Hospitalization Summary: Please see all hospital notes, images, labs for full details of the hospitalization. Admission provider HPI:  57 year old gentleman with history of chronic alcohol use, psoriasis, cervical radiculopathy, asthma, COPD, GERD, hypertension, smoker reportedly was supposed to go to [facility] dermatology for an appointment today for his uncontrolled psoriasis that has not been treated in over 1 year.  He reportedly was too short of breath this morning and presented to the ED for care.  He reports that his psoriasis has worsened since he stopped taking the immunosuppressant injection medication Nelson Chimes) that he had been on in the past that is no longer covered by Medicaid.  He is complaining of cough myalgias and shortness of breath.  His symptoms began yesterday.  He says that he normally takes albuterol and Stiolto but has run out of the medications.  He reports chest tightness wheezing coughing that is nonproductive and congestion.  He denies having fever and chills.  He complains of foul taste in the mouth and blurry vision and postnasal sinus drainage.  He was started on treatment for COPD exacerbation in the emergency department.  He initially improved with therapies however when they attempted to get him up to walk to the bathroom he desaturated to 76% pulse ox on room air and has required continuous supplemental oxygen therapy.  He reports that he does not take oxygen at home.  He is being admitted for acute  COPD exacerbation.  Hospital Course Pt was admitted for treatment of acute COPD exacerbation and was treated with IV steroids, antibiotics, scheduled bronchodilators, mucolytics and supplemental oxygen.  He is feeling much better today and is eager to go home.  I have asked for a home Oxygen evaluation screen to be done prior to discharge to see if he will need home oxygen to be arranged by TOC.  He was strongly advised to stop smoking and alcohol use, strongly advised to reschedule appt to follow up with [facility] dermatology regarding his poorly controlled psoriasis.  He agreed and verbalized understanding.    Discharge Diagnoses:  Principal Problem:   COPD with acute exacerbation (HCC) Active Problems:   Alcohol abuse   COPD (chronic obstructive pulmonary disease) (HCC)   Normocytic anemia   GERD (gastroesophageal reflux disease)   Tobacco abuse   Hypertension, essential   Psoriasis   Prediabetes   Discharge Instructions:  Allergies as of 02/14/2023       Reactions   Penicillins Rash   immediate rash, facial/tongue/throat swelling, SOB or lightheadedness with hypotension        Medication List     STOP taking these medications    naproxen sodium 220 MG tablet Commonly known as: ALEVE       TAKE these medications    acetaminophen 500 MG tablet Commonly known as: TYLENOL Take 2,000 mg by mouth every 6 (six) hours as needed for mild pain (pain score 1-3). Takes as needed   amlodipine-olmesartan 10-20 MG tablet Commonly known as: AZOR TAKE ONE (1) TABLET BY MOUTH EVERY DAY  dextromethorphan 30 MG/5ML liquid Commonly known as: DELSYM Take 5 mLs (30 mg total) by mouth 2 (two) times daily as needed for cough.   doxycycline 100 MG tablet Commonly known as: VIBRA-TABS Take 1 tablet (100 mg total) by mouth every 12 (twelve) hours for 3 days.   folic acid 1 MG tablet Commonly known as: FOLVITE Take 1 tablet (1 mg total) by mouth daily. Start taking on:  February 15, 2023   guaiFENesin 600 MG 12 hr tablet Commonly known as: MUCINEX Take 2 tablets (1,200 mg total) by mouth 2 (two) times daily for 3 days.   multivitamin with minerals Tabs tablet Take 1 tablet by mouth daily. Start taking on: February 15, 2023   predniSONE 20 MG tablet Commonly known as: DELTASONE Take 2 PO QAM x 5 days Start taking on: February 15, 2023   Stiolto Respimat 2.5-2.5 MCG/ACT Aers Generic drug: Tiotropium Bromide-Olodaterol Inhale 2 puffs into the lungs daily. INHALE TWO PUFFS INTO THE LUNGS DAILY What changed: See the new instructions.   thiamine 100 MG tablet Commonly known as: Vitamin B-1 Take 1 tablet (100 mg total) by mouth daily. Start taking on: February 15, 2023   Ventolin HFA 108 (90 Base) MCG/ACT inhaler Generic drug: albuterol Inhale 2 puffs into the lungs every 4 (four) hours as needed for wheezing or shortness of breath. What changed: See the new instructions.        Follow-up Information     Billie Lade, MD. Schedule an appointment as soon as possible for a visit in 1 week(s).   Specialty: Internal Medicine Why: Hospital Follow Up Contact information: [address] [place] [address] [phone]         [facility]. Schedule an appointment as soon as possible for a visit in 1 week(s).   Why: Hospital Follow Up               Allergies  Allergen Reactions   Penicillins Rash    immediate rash, facial/tongue/throat swelling, SOB or lightheadedness with hypotension   Allergies as of 02/14/2023       Reactions   Penicillins Rash   immediate rash, facial/tongue/throat swelling, SOB or lightheadedness with hypotension        Medication List     STOP taking these medications    naproxen sodium 220 MG tablet Commonly known as: ALEVE       TAKE these medications    acetaminophen 500 MG tablet Commonly known as: TYLENOL Take 2,000 mg by mouth every 6 (six) hours as needed  for mild pain (pain score 1-3). Takes as needed   amlodipine-olmesartan 10-20 MG tablet Commonly known as: AZOR TAKE ONE (1) TABLET BY MOUTH EVERY DAY   dextromethorphan 30 MG/5ML liquid Commonly known as: DELSYM Take 5 mLs (30 mg total) by mouth 2 (two) times daily as needed for cough.   doxycycline 100 MG tablet Commonly known as: VIBRA-TABS Take 1 tablet (100 mg total) by mouth every 12 (twelve) hours for 3 days.   folic acid 1 MG tablet Commonly known as: FOLVITE Take 1 tablet (1 mg total) by mouth daily. Start taking on: February 15, 2023   guaiFENesin 600 MG 12 hr tablet Commonly known as: MUCINEX Take 2 tablets (1,200 mg total) by mouth 2 (two) times daily for 3 days.   multivitamin with minerals Tabs tablet Take 1 tablet by mouth daily. Start taking on: February 15, 2023   predniSONE 20 MG tablet Commonly known  as: DELTASONE Take 2 PO QAM x 5 days Start taking on: February 15, 2023   Stiolto Respimat 2.5-2.5 MCG/ACT Aers Generic drug: Tiotropium Bromide-Olodaterol Inhale 2 puffs into the lungs daily. INHALE TWO PUFFS INTO THE LUNGS DAILY What changed: See the new instructions.   thiamine 100 MG tablet Commonly known as: Vitamin B-1 Take 1 tablet (100 mg total) by mouth daily. Start taking on: February 15, 2023   Ventolin HFA 108 (90 Base) MCG/ACT inhaler Generic drug: albuterol Inhale 2 puffs into the lungs every 4 (four) hours as needed for wheezing or shortness of breath. What changed: See the new instructions.        Procedures/Studies: DG Chest Port 1 View Result Date: 02/13/2023 CLINICAL DATA:  Cough, short of breath, psoriasis EXAM: PORTABLE CHEST 1 VIEW COMPARISON:  05/10/2015 FINDINGS: 2 frontal views of the chest demonstrate an unremarkable cardiac silhouette. No acute airspace disease, effusion, or pneumothorax. No acute bony abnormalities. IMPRESSION: 1. No acute intrathoracic process. Electronically Signed   By: Sharlet Salina M.D.   On:  02/13/2023 09:07     Subjective: Pt reports that the supplemental oxygen and steroids helped his shortness of breath resolve completely.  He is eager to go home today.    Discharge Exam: Vitals:   02/14/23 0730 02/14/23 0823  BP:  103/71  Pulse:  100  Resp:  18  Temp:    SpO2: 96% 95%   Vitals:   02/14/23 0211 02/14/23 0412 02/14/23 0730 02/14/23 0823  BP: 104/74 118/78  103/71  Pulse: 70 81  100  Resp: 18 19  18   Temp:  98.4 F (36.9 C)    TempSrc:      SpO2: 100% 98% 96% 95%  Weight:      Height:        General: Pt is alert, awake, not in acute distress Cardiovascular: normal S1/S2 +, no rubs, no gallops Respiratory: CTA bilaterally, no wheezing, no rhonchi Abdominal: Soft, NT, ND, bowel sounds + Extremities: no edema, no cyanosis   The results of significant diagnostics from this hospitalization (including imaging, microbiology, ancillary and laboratory) are listed below for reference.     Microbiology: Recent Results (from the past 240 hours)  Resp panel by RT-PCR (RSV, Flu A&B, Covid) Anterior Nasal Swab     Status: None   Collection Time: 02/13/23  8:55 AM   Specimen: Anterior Nasal Swab  Result Value Ref Range Status   SARS Coronavirus 2 by RT PCR NEGATIVE NEGATIVE Final    Comment: (NOTE) SARS-CoV-2 target nucleic acids are NOT DETECTED.  The SARS-CoV-2 RNA is generally detectable in upper respiratory specimens during the acute phase of infection. The lowest concentration of SARS-CoV-2 viral copies this assay can detect is 138 copies/mL. A negative result does not preclude SARS-Cov-2 infection and should not be used as the sole basis for treatment or other patient management decisions. A negative result may occur with  improper specimen collection/handling, submission of specimen other than nasopharyngeal swab, presence of viral mutation(s) within the areas targeted by this assay, and inadequate number of viral copies(<138 copies/mL). A negative result  must be combined with clinical observations, patient history, and epidemiological information. The expected result is Negative.  Fact Sheet for Patients:  BloggerCourse.com  Fact Sheet for Healthcare Providers:  SeriousBroker.it  This test is no t yet approved or cleared by the [place] FDA and  has been authorized for detection and/or diagnosis of SARS-CoV-2 by FDA under an Emergency Use Authorization (EUA).  This EUA will remain  in effect (meaning this test can be used) for the duration of the COVID-19 declaration under Section 564(b)(1) of the Act, 21 U.S.C.section 360bbb-3(b)(1), unless the authorization is terminated  or revoked sooner.       Influenza A by PCR NEGATIVE NEGATIVE Final   Influenza B by PCR NEGATIVE NEGATIVE Final    Comment: (NOTE) The Xpert Xpress SARS-CoV-2/FLU/RSV plus assay is intended as an aid in the diagnosis of influenza from Nasopharyngeal swab specimens and should not be used as a sole basis for treatment. Nasal washings and aspirates are unacceptable for Xpert Xpress SARS-CoV-2/FLU/RSV testing.  Fact Sheet for Patients: BloggerCourse.com  Fact Sheet for Healthcare Providers: SeriousBroker.it  This test is not yet approved or cleared by the [place] FDA and has been authorized for detection and/or diagnosis of SARS-CoV-2 by FDA under an Emergency Use Authorization (EUA). This EUA will remain in effect (meaning this test can be used) for the duration of the COVID-19 declaration under Section 564(b)(1) of the Act, 21 U.S.C. section 360bbb-3(b)(1), unless the authorization is terminated or revoked.     Resp Syncytial Virus by PCR NEGATIVE NEGATIVE Final    Comment: (NOTE) Fact Sheet for Patients: BloggerCourse.com  Fact Sheet for Healthcare Providers: SeriousBroker.it  This test is  not yet approved or cleared by the [place] FDA and has been authorized for detection and/or diagnosis of SARS-CoV-2 by FDA under an Emergency Use Authorization (EUA). This EUA will remain in effect (meaning this test can be used) for the duration of the COVID-19 declaration under Section 564(b)(1) of the Act, 21 U.S.C. section 360bbb-3(b)(1), unless the authorization is terminated or revoked.  Performed at [facility], [address] [place], [place] [address]      Labs: BNP (last 3 results) No results for input(s): "BNP" in the last 8760 hours. Basic Metabolic Panel: Recent Labs  Lab 02/13/23 0902 02/14/23 0411  NA 128*  --   K 3.9  --   CL 99  --   CO2 24  --   GLUCOSE 99  --   BUN 12  --   CREATININE 0.67  --   CALCIUM 8.8*  --   MG  --  2.1   Liver Function Tests: No results for input(s): "AST", "ALT", "ALKPHOS", "BILITOT", "PROT", "ALBUMIN" in the last 168 hours. No results for input(s): "LIPASE", "AMYLASE" in the last 168 hours. No results for input(s): "AMMONIA" in the last 168 hours. CBC: Recent Labs  Lab 02/13/23 0902  WBC 8.1  NEUTROABS 5.7  HGB 12.7*  HCT 38.1*  MCV 99.2  PLT 301   Cardiac Enzymes: No results for input(s): "CKTOTAL", "CKMB", "CKMBINDEX", "TROPONINI" in the last 168 hours. BNP: Invalid input(s): "POCBNP" CBG: No results for input(s): "GLUCAP" in the last 168 hours. D-Dimer No results for input(s): "DDIMER" in the last 72 hours. Hgb A1c No results for input(s): "HGBA1C" in the last 72 hours. Lipid Profile No results for input(s): "CHOL", "HDL", "LDLCALC", "TRIG", "CHOLHDL", "LDLDIRECT" in the last 72 hours. Thyroid function studies No results for input(s): "TSH", "T4TOTAL", "T3FREE", "THYROIDAB" in the last 72 hours.  Invalid input(s): "FREET3" Anemia work up No results for input(s): "VITAMINB12", "FOLATE", "FERRITIN", "TIBC", "IRON", "RETICCTPCT" in the last 72 hours. Urinalysis    Component Value Date/Time    COLORURINE YELLOW 05/10/2015 2048   APPEARANCEUR CLEAR 05/10/2015 2048   LABSPEC 1.015 05/10/2015 2048   PHURINE 5.0 05/10/2015 2048   GLUCOSEU NEGATIVE 05/10/2015 2048   HGBUR  NEGATIVE 05/10/2015 2048   BILIRUBINUR NEGATIVE 05/10/2015 2048   KETONESUR NEGATIVE 05/10/2015 2048   PROTEINUR NEGATIVE 05/10/2015 2048   UROBILINOGEN 0.2 05/30/2012 1447   NITRITE NEGATIVE 05/10/2015 2048   LEUKOCYTESUR NEGATIVE 05/10/2015 2048   Sepsis Labs Recent Labs  Lab 02/13/23 0902  WBC 8.1   Microbiology Recent Results (from the past 240 hours)  Resp panel by RT-PCR (RSV, Flu A&B, Covid) Anterior Nasal Swab     Status: None   Collection Time: 02/13/23  8:55 AM   Specimen: Anterior Nasal Swab  Result Value Ref Range Status   SARS Coronavirus 2 by RT PCR NEGATIVE NEGATIVE Final    Comment: (NOTE) SARS-CoV-2 target nucleic acids are NOT DETECTED.  The SARS-CoV-2 RNA is generally detectable in upper respiratory specimens during the acute phase of infection. The lowest concentration of SARS-CoV-2 viral copies this assay can detect is 138 copies/mL. A negative result does not preclude SARS-Cov-2 infection and should not be used as the sole basis for treatment or other patient management decisions. A negative result may occur with  improper specimen collection/handling, submission of specimen other than nasopharyngeal swab, presence of viral mutation(s) within the areas targeted by this assay, and inadequate number of viral copies(<138 copies/mL). A negative result must be combined with clinical observations, patient history, and epidemiological information. The expected result is Negative.  Fact Sheet for Patients:  BloggerCourse.com  Fact Sheet for Healthcare Providers:  SeriousBroker.it  This test is no t yet approved or cleared by the [place] FDA and  has been authorized for detection and/or diagnosis of SARS-CoV-2 by FDA under an  Emergency Use Authorization (EUA). This EUA will remain  in effect (meaning this test can be used) for the duration of the COVID-19 declaration under Section 564(b)(1) of the Act, 21 U.S.C.section 360bbb-3(b)(1), unless the authorization is terminated  or revoked sooner.       Influenza A by PCR NEGATIVE NEGATIVE Final   Influenza B by PCR NEGATIVE NEGATIVE Final    Comment: (NOTE) The Xpert Xpress SARS-CoV-2/FLU/RSV plus assay is intended as an aid in the diagnosis of influenza from Nasopharyngeal swab specimens and should not be used as a sole basis for treatment. Nasal washings and aspirates are unacceptable for Xpert Xpress SARS-CoV-2/FLU/RSV testing.  Fact Sheet for Patients: BloggerCourse.com  Fact Sheet for Healthcare Providers: SeriousBroker.it  This test is not yet approved or cleared by the [place] FDA and has been authorized for detection and/or diagnosis of SARS-CoV-2 by FDA under an Emergency Use Authorization (EUA). This EUA will remain in effect (meaning this test can be used) for the duration of the COVID-19 declaration under Section 564(b)(1) of the Act, 21 U.S.C. section 360bbb-3(b)(1), unless the authorization is terminated or revoked.     Resp Syncytial Virus by PCR NEGATIVE NEGATIVE Final    Comment: (NOTE) Fact Sheet for Patients: BloggerCourse.com  Fact Sheet for Healthcare Providers: SeriousBroker.it  This test is not yet approved or cleared by the [place] FDA and has been authorized for detection and/or diagnosis of SARS-CoV-2 by FDA under an Emergency Use Authorization (EUA). This EUA will remain in effect (meaning this test can be used) for the duration of the COVID-19 declaration under Section 564(b)(1) of the Act, 21 U.S.C. section 360bbb-3(b)(1), unless the authorization is terminated or revoked.  Performed at [facility],  [address] [place], [place] [address]     Time coordinating discharge: 38 mins  SIGNED:  Standley Dakins, MD  [facility] 02/14/2023, 11:05  AM How to contact the [facility] Attending or Consulting provider 7A - 7P or covering provider during after hours 7P -7A, for this patient?  Check the care team in [facility] and look for a) attending/consulting [facility] provider listed and b) the [facility] team listed Log into [email or web address] and use [facility] universal password to access. If you do not have the password, please contact the hospital operator. Locate the [facility] provider you are looking for under [facility] and page to a number that you can be directly reached. If you still have difficulty reaching the provider, please page the [facility] (Director on Call) for the Hospitalists listed on amion for assistance.

## 2023-02-14 NOTE — Progress Notes (Signed)
   02/14/23 1108  TOC Brief Assessment  Insurance and Status Reviewed  Patient has primary care physician Yes  Home environment has been reviewed From home  Prior level of function: Ind  Prior/Current Home Services No current home services  Social Drivers of Health Review SDOH reviewed no interventions necessary  Readmission risk has been reviewed Yes  Transition of care needs no transition of care needs at this time

## 2023-02-14 NOTE — Discharge Instructions (Signed)
IMPORTANT INFORMATION: PAY CLOSE ATTENTION   PHYSICIAN DISCHARGE INSTRUCTIONS  Follow with Primary care provider  Shane Abraham, MD  and other consultants as instructed by your Hospitalist Physician  [place] IF SYMPTOMS COME BACK, WORSEN OR NEW PROBLEM DEVELOPS   Please note: You were cared for by a hospitalist during your hospital stay. Every effort will be made to forward records to your primary care provider.  You can request that your primary care provider send for your hospital records if they have not received them.  Once you are discharged, your primary care physician will handle any further medical issues. Please note that NO REFILLS for any discharge medications will be authorized once you are discharged, as it is imperative that you return to your primary care physician (or establish a relationship with a primary care physician if you do not have one) for your post hospital discharge needs so that they can reassess your need for medications and monitor your lab values.  Please get a complete blood count and chemistry panel checked by your Primary MD at your next visit, and again as instructed by your Primary MD.  Get Medicines reviewed and adjusted: Please take all your medications with you for your next visit with your Primary MD  Laboratory/radiological data: Please request your Primary MD to go over all hospital tests and procedure/radiological results at the follow up, please ask your primary care provider to get all Hospital records sent to his/her office.  In some cases, they will be blood work, cultures and biopsy results pending at the time of your discharge. Please request that your primary care provider follow up on these results.  If you are diabetic, please bring your blood sugar readings with you to your follow up appointment with primary care.    Please call and make your follow up appointments as soon as possible.    Also Note  the following: If you experience worsening of your admission symptoms, develop shortness of breath, life threatening emergency, suicidal or homicidal thoughts you must seek medical attention immediately by calling 911 or calling your MD immediately  if symptoms less severe.  You must read complete instructions/literature along with all the possible adverse reactions/side effects for all the Medicines you take and that have been prescribed to you. Take any new Medicines after you have completely understood and accpet all the possible adverse reactions/side effects.   Do not drive when taking Pain medications or sleeping medications (Benzodiazepines)  Do not take more than prescribed Pain, Sleep and Anxiety Medications. It is not advisable to combine anxiety,sleep and pain medications without talking with your primary care practitioner  Special Instructions: If you have smoked or chewed Tobacco  in the last 2 yrs please stop smoking, stop any regular Alcohol  and or any Recreational drug use.  Wear Seat belts while driving.  Do not drive if taking any narcotic, mind altering or controlled substances or recreational drugs or alcohol.

## 2023-02-14 NOTE — Progress Notes (Signed)
Mobility Specialist Progress Note:    02/14/23 1332  Mobility  Activity Ambulated with assistance in hallway  Level of Assistance Independent  Assistive Device None  Distance Ambulated (ft) 200 ft  Range of Motion/Exercises Active;All extremities  Activity Response Tolerated well  Mobility Referral Yes  Mobility visit 1 Mobility  Mobility Specialist Start Time (ACUTE ONLY) 1310  Mobility Specialist Stop Time (ACUTE ONLY) 1325  Mobility Specialist Time Calculation (min) (ACUTE ONLY) 15 min   Pt received sitting EOB, agreeable to mobility. Independently able to stand and ambulate with no AD. Tolerated well, SpO2 97% on RA at rest, SpO2 94% on RA during ambulation. Denies SOB. Returned to room, left pt sitting EOB. All needs met.   Lawerance Bach Mobility Specialist Please contact via Special educational needs teacher or  Rehab office at [phone]

## 2023-02-16 NOTE — Progress Notes (Signed)
Established Patient Office Visit   Subjective  Patient ID: Shane Nuding., male    DOB: [birth year]  Age: 57 y.o. MRN: [id number]  Chief Complaint  Patient presents with   Follow-up    Pt f/u from hospital, pt reports being covered in psoriasis needs a referral closer to home for dermatology.    He  has a past medical history of Abnormal result of iron profile testing (03/03/2016), AKI (acute kidney injury) (HCC) (05/10/2015), Alcohol use, Asthma, Cervical radiculopathy, Cervical spondylosis without myelopathy (05/14/2013), Chronic neck pain, COPD (chronic obstructive pulmonary disease) (HCC), GERD (gastroesophageal reflux disease), GI bleed (05/10/2015), Hypertension, Joint ache, Numbness and tingling in hands (05/14/2013), Partial tear of rotator cuff (04/02/2013), Pneumonia, Psoriasis, Radicular pain in right arm (05/14/2013), and Shortness of breath dyspnea.  HPI Patient presents to the clinic for a follow-up visit after hospital discharge. During their admission, the patient was treated for an acute COPD exacerbation with IV steroids, antibiotics, scheduled bronchodilators, mucolytics, and supplemental oxygen.  Review of Systems  Constitutional:  Negative for chills and fever.  Eyes:  Negative for blurred vision.  Respiratory:  Positive for cough and shortness of breath. Negative for sputum production and wheezing.   Cardiovascular:  Negative for chest pain.  Neurological:  Negative for dizziness and headaches.      Objective:     BP 136/82 (BP Location: Left Arm)   Pulse 86   Ht 5\' 7"  (1.702 m)   Wt 137 lb 0.6 oz (62.2 kg)   SpO2 96%   BMI 21.46 kg/m  BP Readings from Last 3 Encounters:  02/17/23 136/82  02/14/23 116/78  01/06/23 128/85      Physical Exam Vitals reviewed.  Constitutional:      General: He is not in acute distress.    Appearance: Normal appearance. He is not ill-appearing, toxic-appearing or diaphoretic.  HENT:     Head: Normocephalic.   Eyes:     General:        Right eye: No discharge.        Left eye: No discharge.     Conjunctiva/sclera: Conjunctivae normal.  Cardiovascular:     Rate and Rhythm: Normal rate.     Pulses: Normal pulses.     Heart sounds: Normal heart sounds.  Pulmonary:     Effort: Pulmonary effort is normal. No respiratory distress.     Breath sounds: Normal breath sounds.  Abdominal:     General: Bowel sounds are normal.     Palpations: Abdomen is soft.     Tenderness: There is no abdominal tenderness. There is no guarding.  Skin:    Capillary Refill: Capillary refill takes less than 2 seconds.     Findings: Erythema and rash present.     Comments: scaly, red patches on both arms and the abdominal area.  Neurological:     Mental Status: He is alert.     Coordination: Coordination normal.     Gait: Gait normal.  Psychiatric:        Mood and Affect: Mood normal.        Behavior: Behavior normal.      No results found for any visits on 02/17/23.  The 10-year ASCVD risk score (Arnett DK, et al., 2019) is: 7%    Assessment & Plan:  Hospital discharge follow-up Assessment & Plan: Patient reports feeling improved with no recent acute episodes BMP and CBC labs ordered. The hospital chart, including the discharge summary, was thoroughly reviewed Medications were  thoroughly reviewed and reconciled with the patient.  Stiolto for daily use and has an albuterol rescue inhaler.  Referral placed to pulmonology   Orders: -     BMP8+eGFR -     CBC with Differential/Platelet  Psoriasis Assessment & Plan: Trial on Kenalog twice daily x 14 days, followed by  calcipotriene twice daily x 14 days Oztezla starter pack initiated Referral placed to dermatology   Advise to keep your skin moisturized with thick creams or ointments to reduce dryness and irritation. Avoid triggers like stress, smoking, and harsh soaps, and use gentle products suitable for sensitive skin.   Orders: -     Triamcinolone  Acetonide; Apply 1 Application topically 2 (two) times daily. For 14 days  Dispense: 30 g; Refill: 0 -     Calcipotriene; Apply topically 2 (two) times daily. Apply the cream for 14 days following the completion of the 14-day Kenalog treatment  Dispense: 60 g; Refill: 0 -     Otezla; Day 1: 10 mg in the morning. Day 2: 10 mg in the morning and 10 mg in the evening Day 3: 10 mg in the morning and 20 mg in the evening Day 4: 20 mg in the morning and 20 mg in the evening Day 5: 20 mg in the morning and 30 mg in the evening Day 6 and onwards: 30 mg twice daily  Dispense: 55 each; Refill: 0 -     Ambulatory referral to Dermatology  Chronic obstructive pulmonary disease, unspecified COPD type (HCC) -     Ambulatory referral to Pulmonology    Return if symptoms worsen or fail to improve.   Cruzita Lederer Newman Nip, FNP

## 2023-02-16 NOTE — Patient Instructions (Addendum)
        Great to see you today.  I have refilled the medication(s) we provide.    Dermatology [facility] # [phone]   Sidney Ace  [phone]    If labs were collected, we will inform you of lab results once received either by echart message or telephone call.   - echart message- for normal results that have been seen by the patient already.   - telephone call: abnormal results or if patient has not viewed results in their echart.   - Please take medications as prescribed. - Follow up with your primary health provider if any health concerns arises. - If symptoms worsen please contact your primary care provider and/or visit the emergency department.

## 2023-02-17 ENCOUNTER — Encounter: Payer: Self-pay | Admitting: Family Medicine

## 2023-02-17 ENCOUNTER — Ambulatory Visit: Payer: Medicaid Other | Admitting: Family Medicine

## 2023-02-17 VITALS — BP 136/82 | HR 86 | Ht 67.0 in | Wt 137.0 lb

## 2023-02-17 DIAGNOSIS — L409 Psoriasis, unspecified: Secondary | ICD-10-CM | POA: Diagnosis not present

## 2023-02-17 DIAGNOSIS — J449 Chronic obstructive pulmonary disease, unspecified: Secondary | ICD-10-CM | POA: Diagnosis not present

## 2023-02-17 DIAGNOSIS — Z09 Encounter for follow-up examination after completed treatment for conditions other than malignant neoplasm: Secondary | ICD-10-CM | POA: Diagnosis not present

## 2023-02-17 MED ORDER — CALCIPOTRIENE 0.005 % EX CREA: TOPICAL_CREAM | Freq: Two times a day (BID) | CUTANEOUS | 0 refills | Status: DC

## 2023-02-17 MED ORDER — OTEZLA 10 & 20 & 30 MG PO TBPK: ORAL_TABLET | ORAL | 0 refills | Status: DC

## 2023-02-17 MED ORDER — TRIAMCINOLONE ACETONIDE 0.1 % EX CREA: 1.0000 | TOPICAL_CREAM | Freq: Two times a day (BID) | CUTANEOUS | 0 refills | Status: DC

## 2023-02-17 NOTE — Assessment & Plan Note (Signed)
Patient reports feeling improved with no recent acute episodes BMP and CBC labs ordered. The hospital chart, including the discharge summary, was thoroughly reviewed Medications were thoroughly reviewed and reconciled with the patient.  Stiolto for daily use and has an albuterol rescue inhaler.  Referral placed to pulmonology

## 2023-02-17 NOTE — Assessment & Plan Note (Signed)
Trial on Kenalog twice daily x 14 days, followed by  calcipotriene twice daily x 14 days Oztezla starter pack initiated Referral placed to dermatology   Advise to keep your skin moisturized with thick creams or ointments to reduce dryness and irritation. Avoid triggers like stress, smoking, and harsh soaps, and use gentle products suitable for sensitive skin.

## 2023-02-18 LAB — BMP8+EGFR
BUN/Creatinine Ratio: 19 (ref 9–20)
BUN: 15 mg/dL (ref 6–24)
CO2: 21 mmol/L (ref 20–29)
Calcium: 9 mg/dL (ref 8.7–10.2)
Chloride: 100 mmol/L (ref 96–106)
Creatinine, Ser: 0.81 mg/dL (ref 0.76–1.27)
Glucose: 135 mg/dL — ABNORMAL HIGH (ref 70–99)
Potassium: 4.6 mmol/L (ref 3.5–5.2)
Sodium: 135 mmol/L (ref 134–144)
eGFR: 104 mL/min/{1.73_m2} (ref >59)

## 2023-02-18 LAB — CBC WITH DIFFERENTIAL/PLATELET
Basophils Absolute: 0 10*3/uL (ref 0.0–0.2)
Basos: 0 %
EOS (ABSOLUTE): 0 10*3/uL (ref 0.0–0.4)
Eos: 0 %
Hematocrit: 37.3 % — ABNORMAL LOW (ref 37.5–51.0)
Hemoglobin: 12.3 g/dL — ABNORMAL LOW (ref 13.0–17.7)
Immature Grans (Abs): 0.1 10*3/uL (ref 0.0–0.1)
Immature Granulocytes: 1 %
Lymphocytes Absolute: 0.9 10*3/uL (ref 0.7–3.1)
Lymphs: 9 %
MCH: 32.4 pg (ref 26.6–33.0)
MCHC: 33 g/dL (ref 31.5–35.7)
MCV: 98 fL — ABNORMAL HIGH (ref 79–97)
Monocytes Absolute: 0.1 10*3/uL (ref 0.1–0.9)
Monocytes: 1 %
Neutrophils Absolute: 8.6 10*3/uL — ABNORMAL HIGH (ref 1.4–7.0)
Neutrophils: 89 %
Platelets: 374 10*3/uL (ref 150–450)
RBC: 3.8 x10E6/uL — ABNORMAL LOW (ref 4.14–5.80)
RDW: 11.8 % (ref 11.6–15.4)
WBC: 9.7 10*3/uL (ref 3.4–10.8)

## 2023-02-20 ENCOUNTER — Telehealth: Payer: Self-pay | Admitting: Internal Medicine

## 2023-02-20 NOTE — Telephone Encounter (Signed)
Copied from CRM [id number]. Topic: Referral - Request for Referral >> Feb 20, 2023 10:23 AM Geroge Baseman wrote: Did the patient discuss referral with their provider in the last year? Yes   Appointment offered? No  Type of order/referral and detailed reason for visit: Dermatology   Preference of office, provider, location: [facility] takes his insurance, he spoke with them but they do need a referral sent over from the office before they can schedule him.  If referral order, have you been seen by this specialty before? No   Can we respond through MyChart? No, phone please.

## 2023-03-01 ENCOUNTER — Telehealth: Payer: Self-pay

## 2023-03-02 NOTE — Telephone Encounter (Signed)
 Provider approved

## 2023-03-27 ENCOUNTER — Other Ambulatory Visit: Payer: Self-pay | Admitting: Internal Medicine

## 2023-03-27 DIAGNOSIS — I10 Essential (primary) hypertension: Secondary | ICD-10-CM

## 2023-03-30 ENCOUNTER — Ambulatory Visit: Payer: Medicaid Other | Admitting: Internal Medicine

## 2023-04-14 ENCOUNTER — Telehealth: Payer: Self-pay

## 2023-04-19 NOTE — Telephone Encounter (Signed)
 ERROR
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
## 2023-04-25 ENCOUNTER — Ambulatory Visit: Payer: Medicaid Other | Admitting: Internal Medicine

## 2023-04-25 ENCOUNTER — Telehealth: Payer: Self-pay | Admitting: Internal Medicine
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
## 2023-05-06 ENCOUNTER — Other Ambulatory Visit: Payer: Self-pay | Admitting: Family Medicine

## 2023-05-06 DIAGNOSIS — L409 Psoriasis, unspecified: Secondary | ICD-10-CM

## 2023-05-20 NOTE — Progress Notes (Deleted)
 Shane Allison., male    DOB: 05-May-1965    MRN: [id number]   Brief patient profile:  58  yo*** *** former [place] pt self-referred back to pulmonary clinic in [place]  05/23/2023  for GOLD 2 copd     PFTs 10/2016 ratio 65, FEV1 75%, FVC 91%, no bronchodilator response, TLC 1 1 4%, DLCO 67%  CT chest without contrast 08/2017, stable 5 mm left apical nodule compared to 06/2016 and abdominal CT from 04/2015, tree-in-bud and lingula?  Inflammatory  History of Present Illness  05/23/2023  Pulmonary/ 1st office eval/ [name] / [place] [facility]  No chief complaint on file.    Dyspnea:  *** Cough: *** Sleep: *** SABA use: *** 02 use:*** LDSCT:***  No obvious day to day or daytime pattern/variability or assoc excess/ purulent sputum or mucus plugs or hemoptysis or cp or chest tightness, subjective wheeze or overt sinus or hb symptoms.    Also denies any obvious fluctuation of symptoms with weather or environmental changes or other aggravating or alleviating factors except as outlined above   No unusual exposure hx or h/o childhood pna/ asthma or knowledge of premature birth.  Current Allergies, Complete Past Medical History, Past Surgical History, Family History, and Social History were reviewed in [employer] record.  ROS  The following are not active complaints unless bolded Hoarseness, sore throat, dysphagia, dental problems, itching, sneezing,  nasal congestion or discharge of excess mucus or purulent secretions, ear ache,   fever, chills, sweats, unintended wt loss or wt gain, classically pleuritic or exertional cp,  orthopnea pnd or arm/hand swelling  or leg swelling, presyncope, palpitations, abdominal pain, anorexia, nausea, vomiting, diarrhea  or change in bowel habits or change in bladder habits, change in stools or change in urine, dysuria, hematuria,  rash, arthralgias, visual complaints, headache, numbness, weakness or ataxia or problems with walking or  coordination,  change in mood or  memory.            Outpatient Medications Prior to Visit  Medication Sig Dispense Refill   acetaminophen  (TYLENOL ) 500 MG tablet Take 2,000 mg by mouth every 6 (six) hours as needed for mild pain (pain score 1-3). Takes as needed     amlodipine -olmesartan  (AZOR ) 10-20 MG tablet TAKE ONE (1) TABLET BY MOUTH EVERY DAY 90 tablet 1   calcipotriene  (DOVONOX) 0.005 % cream Apply topically 2 (two) times daily. Apply the cream for 14 days following the completion of the 14-day Kenalog  treatment 60 g 0   dextromethorphan  (DELSYM ) 30 MG/5ML liquid Take 5 mLs (30 mg total) by mouth 2 (two) times daily as needed for cough. 89 mL 0   folic acid  (FOLVITE ) 1 MG tablet Take 1 tablet (1 mg total) by mouth daily. 30 tablet 1   Multiple Vitamin (MULTIVITAMIN WITH MINERALS) TABS tablet Take 1 tablet by mouth daily.     OTEZLA  10 & 20 & 30 MG TBPK FOLLOW PACKAGE DIRECTIONS 55 each 0   predniSONE  (DELTASONE ) 20 MG tablet Take 2 PO QAM x 5 days (Patient not taking: Reported on 02/17/2023) 10 tablet 0   thiamine  (VITAMIN B-1) 100 MG tablet Take 1 tablet (100 mg total) by mouth daily. 30 tablet 1   Tiotropium Bromide-Olodaterol (STIOLTO RESPIMAT ) 2.5-2.5 MCG/ACT AERS Inhale 2 puffs into the lungs daily. INHALE TWO PUFFS INTO THE LUNGS DAILY 4 g 3   triamcinolone  cream (KENALOG ) 0.1 % Apply 1 Application topically 2 (two) times daily. For 14 days 30 g 0  VENTOLIN  HFA 108 (90 Base) MCG/ACT inhaler Inhale 2 puffs into the lungs every 4 (four) hours as needed for wheezing or shortness of breath. 18 g 3   No facility-administered medications prior to visit.    Past Medical History:  Diagnosis Date   Abnormal result of iron profile testing 03/03/2016   AKI (acute kidney injury) (HCC) 05/10/2015   Alcohol use    Asthma    Cervical radiculopathy    Cervical spondylosis without myelopathy 05/14/2013   Chronic neck pain    COPD (chronic obstructive pulmonary disease) (HCC)    GERD  (gastroesophageal reflux disease)    GI bleed 05/10/2015   Hypertension    Joint ache    Right AC joint separation   Numbness and tingling in hands 05/14/2013   Partial tear of rotator cuff 04/02/2013   Pneumonia    Psoriasis    Radicular pain in right arm 05/14/2013   Shortness of breath dyspnea       Objective:     There were no vitals taken for this visit.         Assessment   No problem-specific Assessment & Plan notes found for this encounter.     Shane Gondola, MD 05/20/2023

## 2023-05-23 ENCOUNTER — Encounter: Admitting: Internal Medicine

## 2023-05-23 ENCOUNTER — Encounter: Payer: Self-pay | Admitting: Internal Medicine

## 2023-06-28 DIAGNOSIS — S60222A Contusion of left hand, initial encounter: Secondary | ICD-10-CM | POA: Diagnosis not present

## 2023-07-03 ENCOUNTER — Encounter (HOSPITAL_COMMUNITY): Payer: Self-pay

## 2023-07-07 ENCOUNTER — Ambulatory Visit: Payer: Medicaid Other | Admitting: Internal Medicine

## 2023-07-10 ENCOUNTER — Encounter: Payer: Self-pay | Admitting: Internal Medicine

## 2023-07-10 ENCOUNTER — Ambulatory Visit: Payer: Medicaid Other | Admitting: Internal Medicine

## 2023-07-10 VITALS — BP 124/81 | HR 86 | Ht 67.0 in | Wt 126.0 lb

## 2023-07-10 DIAGNOSIS — I10 Essential (primary) hypertension: Secondary | ICD-10-CM

## 2023-07-10 DIAGNOSIS — J441 Chronic obstructive pulmonary disease with (acute) exacerbation: Secondary | ICD-10-CM | POA: Diagnosis not present

## 2023-07-10 DIAGNOSIS — M79642 Pain in left hand: Secondary | ICD-10-CM

## 2023-07-10 NOTE — Assessment & Plan Note (Signed)
 Remains adequately controlled on current antihypertensive regimen.  No medication changes are indicated today.
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
## 2023-07-10 NOTE — Patient Instructions (Signed)
 It was a pleasure to see you today.  Thank you for giving us  the opportunity to be involved in your care.  Below is a brief recap of your visit and next steps.  We will plan to see you again in 3 months.   Summary Sports medicine referral placed No medication changes Follow up in 3 months

## 2023-07-10 NOTE — Progress Notes (Signed)
 Established Patient Office Visit  Subjective   Patient ID: Bartosz Luginbill., male    DOB: [birth year]  Age: 58 y.o. MRN: [id number]  Chief Complaint  Patient presents with   Care Management    Six month follow up    Referral    Patient hurt his left hand two weeks ago seen UC, they advised patient to come back if no better or request referral to ortho   Mr. Pasion returns to care today for routine follow-up.  He was last evaluated by me in November 2024.  He endorsed left shoulder pain at that time and was referred to orthopedic surgery.  In the interim he was admitted to [facility] 12/23 - 12/24 in the setting of COPD exacerbation.  Seen at [facility] on 12/27 for hospital follow-up.  There have otherwise been no acute interval events.  Mr. Reister reports feeling fairly well today.  His acute concern is dorsal left hand pain for the last 2-3 weeks.  He states that pain began after a power window and motor fell across his left hand.  Since that time he has had pain along the dorsal aspect of the left hand, mostly around the third MCP.  He presented to an outside urgent care 2 weeks ago.  X-rays were obtained and were reportedly negative.  He has been taking meloxicam  for pain relief without significant improvement.  Pain impairs his ability to hold objects in the left hand, such as a coffee cup.  He requests a referral for further evaluation.  Past Medical History:  Diagnosis Date   Abnormal result of iron profile testing 03/03/2016   AKI (acute kidney injury) (HCC) 05/10/2015   Alcohol use    Asthma    Cervical radiculopathy    Cervical spondylosis without myelopathy 05/14/2013   Chronic neck pain    COPD (chronic obstructive pulmonary disease) (HCC)    GERD (gastroesophageal reflux disease)    GI bleed 05/10/2015   Hypertension    Joint ache    Right AC joint separation   Numbness and tingling in hands 05/14/2013   Partial tear of rotator cuff 04/02/2013   Pneumonia    Psoriasis     Radicular pain in right arm 05/14/2013   Shortness of breath dyspnea    Past Surgical History:  Procedure Laterality Date   ACROMIO-CLAVICULAR JOINT REPAIR Right 10/26/2017   Procedure: RIGHT ACROMIO-CLAVICULAR JOINT RECONSTRUCTION;  Surgeon: Jasmine Mesi, MD;  Location: [facility] OR;  Service: Orthopedics;  Laterality: Right;   BIOPSY  05/12/2015   Procedure: BIOPSY;  Surgeon: Alyce Jubilee, MD;  Location: [facility];  Service: Endoscopy;;  gastric biopsy   BIOPSY  03/15/2016   Procedure: BIOPSY;  Surgeon: Alyce Jubilee, MD;  Location: [facility];  Service: Endoscopy;;  random colon bx's   COLONOSCOPY WITH PROPOFOL  N/A 03/15/2016   Procedure: COLONOSCOPY WITH PROPOFOL ;  Surgeon: Alyce Jubilee, MD;  Location: [facility];  Service: Endoscopy;  Laterality: N/A;  12:30 PM   ESOPHAGOGASTRODUODENOSCOPY (EGD) WITH PROPOFOL  N/A 05/12/2015   Dr. Nolene Baumgarten: normal esophagus, gastritis, bleeding friable duodenal mucosa   HAND TENDON SURGERY Right    MULTIPLE TOOTH EXTRACTIONS     POLYPECTOMY  03/15/2016   Procedure: POLYPECTOMY;  Surgeon: Alyce Jubilee, MD;  Location: [facility];  Service: Endoscopy;;  rectal polyp   THUMB AMPUTATION     Partial left thumb    Social History   Tobacco Use   Smoking status:  Every Day    Current packs/day: 1.00    Average packs/day: 1 pack/day for 40.0 years (40.0 ttl pk-yrs)    Types: Cigarettes   Smokeless tobacco: Never   Tobacco comments:    Now smoking one pack a day 06/02/21  Vaping Use   Vaping status: Never Used  Substance Use Topics   Alcohol use: Yes    Comment: PATIENT REPORT 6 PACK OF BEER a day   Drug use: No   Family History  Problem Relation Age of Onset   COPD Mother    Colon cancer Neg Hx    Allergies  Allergen Reactions   Penicillins Rash    immediate rash, facial/tongue/throat swelling, SOB or lightheadedness with hypotension   Review of Systems  Constitutional:  Negative for chills and fever.  HENT:  Negative for sore  throat.   Respiratory:  Negative for cough and shortness of breath.   Cardiovascular:  Negative for chest pain, palpitations and leg swelling.  Gastrointestinal:  Negative for abdominal pain, blood in stool, constipation, diarrhea, nausea and vomiting.  Genitourinary:  Negative for dysuria and hematuria.  Musculoskeletal:  Positive for joint pain (Left hand pain, third MCP). Negative for myalgias.  Skin:  Negative for itching and rash.  Neurological:  Negative for dizziness and headaches.  Psychiatric/Behavioral:  Negative for depression and suicidal ideas.       Objective:     BP 124/81   Pulse 86   Ht 5\' 7"  (1.702 m)   Wt 126 lb (57.2 kg)   SpO2 92%   BMI 19.73 kg/m  BP Readings from Last 3 Encounters:  07/10/23 124/81  02/17/23 136/82  02/14/23 116/78   Physical Exam Vitals reviewed.  Constitutional:      General: He is not in acute distress.    Appearance: Normal appearance. He is not ill-appearing.  HENT:     Head: Normocephalic and atraumatic.     Right Ear: External ear normal.     Left Ear: External ear normal.     Nose: Nose normal. No congestion or rhinorrhea.     Mouth/Throat:     Mouth: Mucous membranes are moist.     Pharynx: Oropharynx is clear.  Eyes:     General: No scleral icterus.    Extraocular Movements: Extraocular movements intact.     Conjunctiva/sclera: Conjunctivae normal.     Pupils: Pupils are equal, round, and reactive to light.  Cardiovascular:     Rate and Rhythm: Normal rate and regular rhythm.     Pulses: Normal pulses.     Heart sounds: Normal heart sounds. No murmur heard. Pulmonary:     Effort: Pulmonary effort is normal.     Breath sounds: Normal breath sounds. No wheezing, rhonchi or rales.  Abdominal:     General: Abdomen is flat. Bowel sounds are normal. There is no distension.     Palpations: Abdomen is soft.     Tenderness: There is no abdominal tenderness.  Musculoskeletal:        General: Swelling present. No  deformity.     Cervical back: Normal range of motion.     Comments: Mild swelling and tenderness to palpation is present around the third MCP joint of the left hand.  Pain is elicited with resisted flexion/extension of the third digit at the MCP joint.  Skin:    General: Skin is warm and dry.     Capillary Refill: Capillary refill takes less than 2 seconds.  Neurological:  General: No focal deficit present.     Mental Status: He is alert and oriented to person, place, and time.     Motor: No weakness.  Psychiatric:        Mood and Affect: Mood normal.        Behavior: Behavior normal.        Thought Content: Thought content normal.   Last CBC Lab Results  Component Value Date   WBC 9.7 02/17/2023   HGB 12.3 (L) 02/17/2023   HCT 37.3 (L) 02/17/2023   MCV 98 (H) 02/17/2023   MCH 32.4 02/17/2023   RDW 11.8 02/17/2023   PLT 374 02/17/2023   Last metabolic panel Lab Results  Component Value Date   GLUCOSE 135 (H) 02/17/2023   NA 135 02/17/2023   K 4.6 02/17/2023   CL 100 02/17/2023   CO2 21 02/17/2023   BUN 15 02/17/2023   CREATININE 0.81 02/17/2023   EGFR 104 02/17/2023   CALCIUM 9.0 02/17/2023   PROT 6.5 10/11/2022   ALBUMIN 4.1 10/11/2022   LABGLOB 2.4 10/11/2022   AGRATIO 1.1 (L) 03/04/2022   BILITOT 0.3 10/11/2022   ALKPHOS 66 10/11/2022   AST 18 10/11/2022   ALT 11 10/11/2022   ANIONGAP 5 02/13/2023   Last lipids Lab Results  Component Value Date   CHOL 148 10/11/2022   HDL 72 10/11/2022   LDLCALC 63 10/11/2022   TRIG 61 10/11/2022   CHOLHDL 2.1 10/11/2022   Last hemoglobin A1c Lab Results  Component Value Date   HGBA1C 5.4 10/11/2022   Last thyroid  functions Lab Results  Component Value Date   TSH 0.707 10/11/2022   Last vitamin D  Lab Results  Component Value Date   VD25OH 62.6 10/11/2022   Last vitamin B12 and Folate Lab Results  Component Value Date   VITAMINB12 344 10/11/2022   FOLATE 4.7 10/11/2022   The 10-year ASCVD risk score  (Arnett DK, et al., 2019) is: 6%    Assessment & Plan:   Problem List Items Addressed This Visit       Hypertension, essential   Remains adequately controlled on current antihypertensive regimen.  No medication changes are indicated today.      COPD (chronic obstructive pulmonary disease) (HCC)   Asymptomatic currently.  Pulmonary exam is unremarkable.  Hospital admission in December 2024 in the setting of COPD exacerbation.  He remains on Stiolto and albuterol .      Left hand pain - Primary   His acute concern today is dorsal left hand pain x 2-3 weeks, mostly around the third MCP joint.  He dropped a power window in motor across his left hand at the onset of pain.  He presented to an outside urgent care 2 weeks ago.  X-rays were reportedly negative.  He has been taking meloxicam  for pain relief without significant improvement.  On exam there is mild swelling and tenderness to palpation around the third MCP joint.  Pain is elicited with flexion/extension.  Treatment options reviewed.  Will refer to sports medicine for further evaluation.      Return in about 3 months (around 10/10/2023).   Tobi Fortes, MD

## 2023-07-10 NOTE — Assessment & Plan Note (Signed)
 Asymptomatic currently.  Pulmonary exam is unremarkable.  Hospital admission in December 2024 in the setting of COPD exacerbation.  He remains on Stiolto and albuterol .

## 2023-07-25 ENCOUNTER — Encounter: Payer: Self-pay | Admitting: Dermatology

## 2023-07-25 ENCOUNTER — Ambulatory Visit: Payer: Medicaid Other | Admitting: Dermatology

## 2023-07-25 VITALS — BP 106/66

## 2023-07-25 DIAGNOSIS — Z139 Encounter for screening, unspecified: Secondary | ICD-10-CM

## 2023-07-25 DIAGNOSIS — L409 Psoriasis, unspecified: Secondary | ICD-10-CM

## 2023-07-25 MED ORDER — CALCIPOTRIENE 0.005 % EX CREA: TOPICAL_CREAM | Freq: Two times a day (BID) | CUTANEOUS | 0 refills | Status: AC

## 2023-07-25 MED ORDER — TRIAMCINOLONE ACETONIDE 0.1 % EX CREA
1.0000 | TOPICAL_CREAM | Freq: Two times a day (BID) | CUTANEOUS | 0 refills | Status: AC
Start: 2023-07-25 — End: 2024-03-08

## 2023-07-25 NOTE — Progress Notes (Unsigned)
 New Patient Visit   Subjective  Shane Petter. is a 58 y.o. male who presents for the following: new Pt - Psorasis  Patient states he  has psorasis located at the scattered that he  would like to have examined. Patient reports the areas have been present since childhood. He reports the areas are bothersome.Patient rates irritation (itchy)10 out of 10 stating that it intensify at night. If he does not moisturize daily his skin will dry and crack open. He states that the areas have not spread. Patient reports he has previously been treated for these areas with Taltz that cleared all plaques. Pt has not taken it in 2 years as he missed his OV with dermatology in [place] due to being admitted in the hospital. Patient denied Hx of bx. Patient denied family history of skin cancer(s).   The following portions of the chart were reviewed this encounter and updated as appropriate: medications, allergies, medical history  Review of Systems:  No other skin or systemic complaints except as noted in HPI or Assessment and Plan.  Objective  Well appearing patient in no apparent distress; mood and affect are within normal limits.   A focused examination was performed of the following areas: B/L arms, torso, buttocks, upper legs   Relevant exam findings are noted in the Assessment and Plan.                Assessment & Plan   PSORIASIS Exam: Well-demarcated erythematous papules/plaques with silvery scale, guttate pink scaly papules. 45% BSA, PGA 3, 6/10 itch    flared   patient denies joint pain   - Assessment: Patient has a history of psoriasis previously controlled with Taltz, but has been off treatment for 2 years. Current presentation shows pink psoriatic plaques with a PGA score of 2. Erythematous plaques with thick scale are present on the abdomen, upper arms, lower legs, back, scalp, and behind the ears. Patient reports itching at night and cracking/bleeding of lesions  without treatment. Previous use of Vaseline has been ineffective.  Patient Education Discussed During Visit: Psoriasis is a chronic non-curable, but treatable genetic/hereditary disease that may have other systemic features affecting other organ systems such as joints (Psoriatic Arthritis). It is associated with an increased risk of inflammatory bowel disease, heart disease, non-alcoholic fatty liver disease, and depression.  Treatments include light and laser treatments; topical medications; and systemic medications including oral and injectables.  Pt is not a candidate for methotrexate or cyclosporine due to inability for follow up labs and contraindications with other medications. Pt has no access to a light box for phototherapy.  Due to the progressive and chronic nature of his psoriasis, patient has tried and failed numerous topicals creams as noted above, the next best therapeutic option is a systemic therapy. It is medically necessary to help improve her quality of life.    - Plan:    Topical treatment:     - Prescribe triamcinolone  cream for twice daily application on active areas for 2 weeks     - Prescribe calcipotriene  cream for twice daily application for 2 weeks, to be alternated with triamcinolone      - Continue alternating topical treatments even after starting Taltz    Systemic therapy:     - Order lab tests for TB and hepatitis screening prior to Taltz initiation     - Initiate Taltz once lab results are received and insurance approval is obtained (estimated 3 weeks)    Additional recommendations:     -  Recommend mild sun exposure while waiting for Taltz    Medication logistics:     - Send prescriptions to [facility]    Follow-up:     - Schedule follow-up in 4 months to assess treatment efficacy     - Consider alternative treatment if Taltz is ineffective    No follow-ups on file.    Documentation: I have reviewed the above documentation for accuracy and  completeness, and I agree with the above.  I, Shirron Louanne Roussel, CMA, am acting as scribe for [employer], DO.   Louana Roup, DO

## 2023-07-25 NOTE — Patient Instructions (Addendum)
 Date: Tue Jul 25 2023  Hello Shane Allison,  Thank you for visiting today. Here is a summary of the key instructions:  - Triamcinolone  Cream:   - Apply a thin layer on active areas twice a day for 2 weeks   - Then take a 2-week break  - Calcipotriene  Cream:   - Use twice a day for 2 weeks during the break from triamcinolone    - Alternate between triamcinolone  and calcipotriene  until clear   - Continue this routine even after starting Taltz  - Labs:   - Go to [facility] tomorrow for TB and hepatitis tests   - Bring the provided lab slip  - New Medication:   - Delford Felling will be prescribed after lab results are received   - Wait for pharmacy to call about delivery details  - Lifestyle Changes:   - Get some sun exposure while waiting for Taltz  - Pharmacy:   - Prescriptions will be sent to [facility]  - Follow-up:   - Make an appointment for 4 months from now  Please reach out if you have any questions or concerns.  Warm regards,  Dr. Louana Roup, Dermatology       Important Information  Due to recent changes in healthcare laws, you may see results of your pathology and/or laboratory studies on MyChart before the doctors have had a chance to review them. We understand that in some cases there may be results that are confusing or concerning to you. Please understand that not all results are received at the same time and often the doctors may need to interpret multiple results in order to provide you with the best plan of care or course of treatment. Therefore, we ask that you please give us  2 business days to thoroughly review all your results before contacting the office for clarification. Should we see a critical lab result, you will be contacted sooner.   If You Need Anything After Your Visit  If you have any questions or concerns for your doctor, please call our main line at [phone] If no one answers, please leave a voicemail as directed and we will return  your call as soon as possible. Messages left after 4 pm will be answered the following business day.   You may also send us  a message via MyChart. We typically respond to MyChart messages within 1-2 business days.  For prescription refills, please ask your pharmacy to contact our office. Our fax number is [phone].  If you have an urgent issue when the clinic is closed that cannot wait until the next business day, you can page your doctor at the number below.    Please note that while we do our best to be available for urgent issues outside of office hours, we are not available 24/7.   If you have an urgent issue and are unable to reach us , you may choose to seek medical care at your doctor's office, retail clinic, urgent care center, or emergency room.  If you have a medical emergency, please immediately call 911 or go to the emergency department. In the event of inclement weather, please call our main line at [phone] for an update on the status of any delays or closures.  Dermatology Medication Tips: Please keep the boxes that topical medications come in in order to help keep track of the instructions about where and how to use these. Pharmacies typically print the medication instructions only on the boxes and not directly on the medication  tubes.   If your medication is too expensive, please contact our office at [phone] or send us  a message through MyChart.   We are unable to tell what your co-pay for medications will be in advance as this is different depending on your insurance coverage. However, we may be able to find a substitute medication at lower cost or fill out paperwork to get insurance to cover a needed medication.   If a prior authorization is required to get your medication covered by your insurance company, please allow us  1-2 business days to complete this process.  Drug prices often vary depending on where the prescription is filled and some pharmacies may  offer cheaper prices.  The website www.goodrx.com contains coupons for medications through different pharmacies. The prices here do not account for what the cost may be with help from insurance (it may be cheaper with your insurance), but the website can give you the price if you did not use any insurance.  - You can print the associated coupon and take it with your prescription to the pharmacy.  - You may also stop by our office during regular business hours and pick up a GoodRx coupon card.  - If you need your prescription sent electronically to a different pharmacy, notify our office through [facility] or by phone at [phone]

## 2023-07-28 ENCOUNTER — Encounter: Payer: Self-pay | Admitting: Dermatology

## 2023-07-29 LAB — ACUTE HEP PANEL AND HEP B SURFACE AB
Hep A IgM: NEGATIVE
Hep B C IgM: NEGATIVE
Hep C Virus Ab: NONREACTIVE
Hepatitis B Surf Ab Quant: <3.5 m[IU]/mL — ABNORMAL LOW
Hepatitis B Surface Ag: NEGATIVE

## 2023-07-29 LAB — QUANTIFERON-TB GOLD PLUS
QuantiFERON Mitogen Value: >10 [IU]/mL
QuantiFERON Nil Value: 0.08 [IU]/mL
QuantiFERON TB1 Ag Value: 0.07 [IU]/mL
QuantiFERON TB2 Ag Value: 0.07 [IU]/mL
QuantiFERON-TB Gold Plus: NEGATIVE

## 2023-08-01 ENCOUNTER — Ambulatory Visit: Payer: Self-pay | Admitting: Dermatology

## 2023-08-01 NOTE — Progress Notes (Signed)
 TB and Hepatitis screenings are negative. Pt is ok to start a biologic   Please send over paperwork for Taltz and send the electronic rx to [facility].  Thanks!

## 2023-08-02 ENCOUNTER — Other Ambulatory Visit: Payer: Self-pay

## 2023-08-02 DIAGNOSIS — L409 Psoriasis, unspecified: Secondary | ICD-10-CM

## 2023-08-02 MED ORDER — TALTZ 80 MG/ML ~~LOC~~ SOAJ: 80.0000 mg | SUBCUTANEOUS | 1 refills | Status: AC

## 2023-08-02 MED ORDER — TALTZ 80 MG/ML ~~LOC~~ SOAJ: 160.0000 mg | SUBCUTANEOUS | 0 refills | Status: AC

## 2023-08-02 MED ORDER — TALTZ 80 MG/ML ~~LOC~~ SOAJ: 80.0000 mg | Freq: Once | SUBCUTANEOUS | 0 refills | Status: AC

## 2023-08-02 MED ORDER — TALTZ 80 MG/ML ~~LOC~~ SOAJ: 80.0000 mg | SUBCUTANEOUS | 8 refills | Status: AC

## 2023-09-05 ENCOUNTER — Ambulatory Visit: Admitting: Orthopedic Surgery

## 2023-09-05 ENCOUNTER — Other Ambulatory Visit (INDEPENDENT_AMBULATORY_CARE_PROVIDER_SITE_OTHER): Payer: Self-pay

## 2023-09-05 ENCOUNTER — Encounter: Payer: Self-pay | Admitting: Orthopedic Surgery

## 2023-09-05 VITALS — BP 130/77 | HR 67 | Ht 67.0 in | Wt 121.0 lb

## 2023-09-05 DIAGNOSIS — M79642 Pain in left hand: Secondary | ICD-10-CM

## 2023-09-05 DIAGNOSIS — S66812A Strain of other specified muscles, fascia and tendons at wrist and hand level, left hand, initial encounter: Secondary | ICD-10-CM

## 2023-09-05 MED ORDER — PREDNISONE 10 MG (21) PO TBPK: ORAL_TABLET | ORAL | 0 refills | Status: AC

## 2023-09-05 NOTE — Progress Notes (Signed)
 New Patient Visit  Assessment: Shane Hemphill. is a 58 y.o. male with the following: 1. Pain in left hand  Plan: Xylan Sheils. has diffuse pain about the left long finger MCP after having a car door window fell directly onto this part of his hand.  He made some popping and catching, but this is not elicited on exam today.  He has diffuse tenderness about the MCP joint.  Radiographs are negative.  The joint does not appear to be unstable.  This could represent an MCP strain, and I advised him that this can take several months to fully resolve.  He states understanding.  I provided him with some prednisone .  If he continues to have issues, he will contact the clinic.  Otherwise, follow-up as needed.  Follow-up: Return if symptoms worsen or fail to improve.  Subjective:  Chief Complaint  Patient presents with   Hand Pain    L hand middle finger. Car door window fell on it 3 mos ago. Still having stiffness, pain, and locking.     History of Present Illness: Shane Greenfeld. is a 58 y.o. male who has been referred by Aleene Fireman, MD for evaluation of left hand pain.  He is right-hand dominant.  Approximately 3 months ago, a car door window fell directly onto his left hand.  He had immediate pain.  He was evaluated at an urgent care center, and provided with an anti-inflammatory medicine.  This improved the swelling.  However, he continues to have pain.  He has good motion, but with certain activities he notes catching and popping.  He has been taking some Tylenol .  No additional injuries.  It does not affect his motion.   Review of Systems: No fevers or chills No numbness or tingling No chest pain No shortness of breath No bowel or bladder dysfunction No GI distress No headaches   Medical History:  Past Medical History:  Diagnosis Date   Abnormal result of iron profile testing 03/03/2016   AKI (acute kidney injury) (HCC) 05/10/2015   Alcohol use     Asthma    Cervical radiculopathy    Cervical spondylosis without myelopathy 05/14/2013   Chronic neck pain    COPD (chronic obstructive pulmonary disease) (HCC)    GERD (gastroesophageal reflux disease)    GI bleed 05/10/2015   Hypertension    Joint ache    Right AC joint separation   Numbness and tingling in hands 05/14/2013   Partial tear of rotator cuff 04/02/2013   Pneumonia    Psoriasis    Radicular pain in right arm 05/14/2013   Shortness of breath dyspnea     Past Surgical History:  Procedure Laterality Date   ACROMIO-CLAVICULAR JOINT REPAIR Right 10/26/2017   Procedure: RIGHT ACROMIO-CLAVICULAR JOINT RECONSTRUCTION;  Surgeon: Addie Cordella Hamilton, MD;  Location: [facility] OR;  Service: Orthopedics;  Laterality: Right;   BIOPSY  05/12/2015   Procedure: BIOPSY;  Surgeon: Margo LITTIE Haddock, MD;  Location: [facility];  Service: Endoscopy;;  gastric biopsy   BIOPSY  03/15/2016   Procedure: BIOPSY;  Surgeon: Margo LITTIE Haddock, MD;  Location: [facility];  Service: Endoscopy;;  random colon bx's   COLONOSCOPY WITH PROPOFOL  N/A 03/15/2016   Procedure: COLONOSCOPY WITH PROPOFOL ;  Surgeon: Margo LITTIE Haddock, MD;  Location: [facility];  Service: Endoscopy;  Laterality: N/A;  12:30 PM   ESOPHAGOGASTRODUODENOSCOPY (EGD) WITH PROPOFOL  N/A 05/12/2015   Dr. Haddock: normal esophagus, gastritis, bleeding friable  duodenal mucosa   HAND TENDON SURGERY Right    MULTIPLE TOOTH EXTRACTIONS     POLYPECTOMY  03/15/2016   Procedure: POLYPECTOMY;  Surgeon: Margo LITTIE Haddock, MD;  Location: [facility];  Service: Endoscopy;;  rectal polyp   THUMB AMPUTATION     Partial left thumb     Family History  Problem Relation Age of Onset   COPD Mother    Colon cancer Neg Hx    Social History   Tobacco Use   Smoking status: Every Day    Current packs/day: 1.00    Average packs/day: 1 pack/day for 40.0 years (40.0 ttl pk-yrs)    Types: Cigarettes   Smokeless tobacco: Never   Tobacco comments:    Now smoking one pack  a day 06/02/21  Vaping Use   Vaping status: Never Used  Substance Use Topics   Alcohol use: Yes    Comment: PATIENT REPORT 6 PACK OF BEER a day   Drug use: No    Allergies  Allergen Reactions   Penicillins Rash    immediate rash, facial/tongue/throat swelling, SOB or lightheadedness with hypotension    Current Meds  Medication Sig   acetaminophen  (TYLENOL ) 500 MG tablet Take 2,000 mg by mouth every 6 (six) hours as needed for mild pain (pain score 1-3). Takes as needed   amlodipine -olmesartan  (AZOR ) 10-20 MG tablet TAKE ONE (1) TABLET BY MOUTH EVERY DAY   calcipotriene  (DOVONOX) 0.005 % cream Apply topically 2 (two) times daily. Apply for 2 weeks on, 2 weeks off, alternating with triamcinolone  until clear   dextromethorphan  (DELSYM ) 30 MG/5ML liquid Take 5 mLs (30 mg total) by mouth 2 (two) times daily as needed for cough.   folic acid  (FOLVITE ) 1 MG tablet Take 1 tablet (1 mg total) by mouth daily.   ixekizumab  (TALTZ ) 80 MG/ML pen Inject 1 mL (80 mg total) into the skin every 14 (fourteen) days. Weeks 4-10.   ixekizumab  (TALTZ ) 80 MG/ML pen Inject 2 mLs (160 mg total) into the skin as directed. Inject 160 mg (2 x 80 mg) subcutaneous at week 0, then begin first induction dose 80 mg (1 x 80 mg) 2 weeks later (week 2).   ixekizumab  (TALTZ ) 80 MG/ML pen Inject 1 mL (80 mg total) into the skin every 28 (twenty-eight) days. For maintenance.   Multiple Vitamin (MULTIVITAMIN WITH MINERALS) TABS tablet Take 1 tablet by mouth daily.   OTEZLA  10 & 20 & 30 MG TBPK FOLLOW PACKAGE DIRECTIONS   predniSONE  (STERAPRED UNI-PAK 21 TAB) 10 MG (21) TBPK tablet 10 mg DS 12 as directed   thiamine  (VITAMIN B-1) 100 MG tablet Take 1 tablet (100 mg total) by mouth daily.   Tiotropium Bromide-Olodaterol (STIOLTO RESPIMAT ) 2.5-2.5 MCG/ACT AERS Inhale 2 puffs into the lungs daily. INHALE TWO PUFFS INTO THE LUNGS DAILY   triamcinolone  cream (KENALOG ) 0.1 % Apply 1 Application topically 2 (two) times daily. Apply  for 2 weeks on, 2 weeks off until clear (alternate with calcipotriene  cream)   VENTOLIN  HFA 108 (90 Base) MCG/ACT inhaler Inhale 2 puffs into the lungs every 4 (four) hours as needed for wheezing or shortness of breath.    Objective: BP 130/77   Pulse 67   Ht 5' 7 (1.702 m)   Wt 121 lb (54.9 kg)   BMI 18.95 kg/m   Physical Exam:  General: Alert and oriented. and No acute distress. Gait: Normal gait.  Left hand without deformity.  Prior injuries to his thumb and index finger,  are well-healed.  No obvious swelling.  Diffuse tenderness to palpation about the MCP joint of the long finger.  He has full flexion, and extension of his joint.  There is no catching or triggering.  No point tenderness over the A1 pulley.  Fingers are warm and well-perfused.   IMAGING: I personally ordered and reviewed the following images   X-rays of the left hand were obtained in clinic today.  No acute injuries are noted.  History of prior surgery to the thumb, including partial amputation of the distal phalanx.  Mild deformity at the DIP joint to the index finger.  Well-maintained joint spaces of the MCP joints to all fingers.  No acute fractures appreciated.  Normal overall alignment.  Impression: Left hand x-ray without acute injury   New Medications:  Meds ordered this encounter  Medications   predniSONE  (STERAPRED UNI-PAK 21 TAB) 10 MG (21) TBPK tablet    Sig: 10 mg DS 12 as directed    Dispense:  48 tablet    Refill:  0      Oneil DELENA Horde, MD  09/05/2023 9:04 AM

## 2023-10-06 ENCOUNTER — Other Ambulatory Visit: Payer: Self-pay | Admitting: Internal Medicine

## 2023-10-06 DIAGNOSIS — I10 Essential (primary) hypertension: Secondary | ICD-10-CM

## 2023-10-09 ENCOUNTER — Ambulatory Visit

## 2023-10-09 VITALS — BP 111/74 | HR 91 | Ht 67.0 in | Wt 118.1 lb

## 2023-10-09 DIAGNOSIS — I10 Essential (primary) hypertension: Secondary | ICD-10-CM | POA: Diagnosis not present

## 2023-10-09 DIAGNOSIS — R634 Abnormal weight loss: Secondary | ICD-10-CM | POA: Diagnosis not present

## 2023-10-09 DIAGNOSIS — J449 Chronic obstructive pulmonary disease, unspecified: Secondary | ICD-10-CM | POA: Diagnosis not present

## 2023-10-09 MED ORDER — ALBUTEROL SULFATE HFA 108 (90 BASE) MCG/ACT IN AERS: 2.0000 | INHALATION_SPRAY | RESPIRATORY_TRACT | 3 refills | Status: AC | PRN

## 2023-10-09 NOTE — Progress Notes (Unsigned)
 Established Patient Office Visit  Subjective   Patient ID: Shane Allison., male    DOB: Jun 19, 1965  Age: 58 y.o. MRN: [id number]  Chief Complaint  Patient presents with   Medical Management of Chronic Issues    Follow up no other concerns at this time other than why he is still losing weight     HPI Discussed the use of AI scribe software for clinical note transcription with the patient, who gave verbal consent to proceed.  History of Present Illness   Shane Allison. is a 58 year old male who presents for a routine follow-up visit.  Unintentional weight loss and decreased appetite - Continues to experience weight loss - Attributes weight loss to lack of appetite and being too busy to eat - States, 'I don't eat nothing all day,' and 'just don't take time to eat.' - Previous chest X-ray in December and CT scan last year were normal  Asthma management - Uses albuterol  inhaler (Ventolin ) for asthma management - Needs to pick up a refill from the pharmacy  Hypertension management - Takes blood pressure medication - Plans to collect medication from the pharmacy - Medication box was lost but is managing to keep up with refills       Patient Active Problem List   Diagnosis Date Noted   Left hand pain 07/10/2023   Hospital discharge follow-up 02/17/2023   COPD with acute exacerbation (HCC) 02/13/2023   Acute pain of left shoulder 01/06/2023   Need for influenza vaccination 01/06/2023   Unintentional weight loss of more than 10% body weight within 6 months 10/11/2022   Prediabetes 04/12/2022   Vitamin D  insufficiency 04/12/2022   Encounter for routine adult health examination with abnormal findings 03/04/2022   Refused influenza vaccine 11/02/2021   Left wrist pain 11/02/2021   Lung nodule seen on imaging study 11/02/2021   Allergies 03/01/2021   Enlarged thyroid  01/28/2021   Psoriasis 01/22/2020   Hypertension, essential 09/03/2019   Tobacco abuse  07/16/2019   GERD (gastroesophageal reflux disease) 07/13/2016   Tubular adenoma of rectum    Normocytic anemia 12/04/2015   Alcohol abuse 05/10/2015   COPD (chronic obstructive pulmonary disease) (HCC) 05/10/2015      ROS    Objective:     BP 111/74   Pulse 91   Ht 5' 7 (1.702 m)   Wt 118 lb 1.3 oz (53.6 kg)   SpO2 94%   BMI 18.49 kg/m  BP Readings from Last 3 Encounters:  10/09/23 111/74  09/05/23 130/77  07/25/23 106/66   Wt Readings from Last 3 Encounters:  10/09/23 118 lb 1.3 oz (53.6 kg)  09/05/23 121 lb (54.9 kg)  07/10/23 126 lb (57.2 kg)     Physical Exam Vitals and nursing note reviewed.  Constitutional:      Appearance: Normal appearance. He is underweight.  HENT:     Head: Normocephalic.  Eyes:     Extraocular Movements: Extraocular movements intact.     Pupils: Pupils are equal, round, and reactive to light.  Cardiovascular:     Rate and Rhythm: Normal rate and regular rhythm.  Pulmonary:     Effort: Pulmonary effort is normal.     Breath sounds: Normal breath sounds.  Musculoskeletal:     Cervical back: Normal range of motion and neck supple.  Neurological:     Mental Status: He is alert and oriented to person, place, and time.  Psychiatric:  Mood and Affect: Mood normal.        Thought Content: Thought content normal.      No results found for any visits on 10/09/23.  Last CBC Lab Results  Component Value Date   WBC 9.7 02/17/2023   HGB 12.3 (L) 02/17/2023   HCT 37.3 (L) 02/17/2023   MCV 98 (H) 02/17/2023   MCH 32.4 02/17/2023   RDW 11.8 02/17/2023   PLT 374 02/17/2023   Last metabolic panel Lab Results  Component Value Date   GLUCOSE 135 (H) 02/17/2023   NA 135 02/17/2023   K 4.6 02/17/2023   CL 100 02/17/2023   CO2 21 02/17/2023   BUN 15 02/17/2023   CREATININE 0.81 02/17/2023   EGFR 104 02/17/2023   CALCIUM 9.0 02/17/2023   PROT 6.5 10/11/2022   ALBUMIN 4.1 10/11/2022   LABGLOB 2.4 10/11/2022   AGRATIO 1.1  (L) 03/04/2022   BILITOT 0.3 10/11/2022   ALKPHOS 66 10/11/2022   AST 18 10/11/2022   ALT 11 10/11/2022   ANIONGAP 5 02/13/2023   Last lipids Lab Results  Component Value Date   CHOL 148 10/11/2022   HDL 72 10/11/2022   LDLCALC 63 10/11/2022   TRIG 61 10/11/2022   CHOLHDL 2.1 10/11/2022   Last hemoglobin A1c Lab Results  Component Value Date   HGBA1C 5.4 10/11/2022   Last thyroid  functions Lab Results  Component Value Date   TSH 0.707 10/11/2022   Last vitamin D  Lab Results  Component Value Date   VD25OH 62.6 10/11/2022      The 10-year ASCVD risk score (Arnett DK, et al., 2019) is: 5.3%    Assessment & Plan:   Problem List Items Addressed This Visit       Cardiovascular and Mediastinum   Hypertension, essential   Remains adequately controlled on current antihypertensive regimen.  No medication changes are indicated today. Continued weight loss may require antihypertensive dosage adjustment. - Advise to report dizziness. - Consider reducing antihypertensive dosage if hypotension symptoms occur.          Respiratory   COPD (chronic obstructive pulmonary disease) (HCC) - Primary   Asymptomatic currently.  Pulmonary exam is unremarkable.  Hospital admission in December 2024 in the setting of COPD exacerbation.  He remains on Stiolto and albuterol . - Refill albuterol  inhaler.      Relevant Medications   albuterol  (VENTOLIN  HFA) 108 (90 Base) MCG/ACT inhaler     Other   Unintentional weight loss of more than 10% body weight within 6 months   Abnormal weight loss Weight loss due to decreased appetite and busy lifestyle. Normal chest X-ray and CT scan. - Encourage regular meal intake, and consider adding a meal replacement shake once daily.         Return in about 6 months (around 04/10/2024) for chronic follow-up with PCP.    Leita Longs, FNP

## 2023-10-10 NOTE — Assessment & Plan Note (Signed)
 Remains adequately controlled on current antihypertensive regimen.  No medication changes are indicated today. Continued weight loss may require antihypertensive dosage adjustment. - Advise to report dizziness. - Consider reducing antihypertensive dosage if hypotension symptoms occur.

## 2023-10-10 NOTE — Assessment & Plan Note (Signed)
 Abnormal weight loss Weight loss due to decreased appetite and busy lifestyle. Normal chest X-ray and CT scan. - Encourage regular meal intake, and consider adding a meal replacement shake once daily.

## 2023-10-10 NOTE — Assessment & Plan Note (Signed)
 Asymptomatic currently.  Pulmonary exam is unremarkable.  Hospital admission in December 2024 in the setting of COPD exacerbation.  He remains on Stiolto and albuterol . - Refill albuterol  inhaler.

## 2023-10-31 DIAGNOSIS — Z79899 Other long term (current) drug therapy: Secondary | ICD-10-CM | POA: Diagnosis not present

## 2023-10-31 DIAGNOSIS — F17219 Nicotine dependence, cigarettes, with unspecified nicotine-induced disorders: Secondary | ICD-10-CM | POA: Diagnosis not present

## 2023-10-31 DIAGNOSIS — J4489 Other specified chronic obstructive pulmonary disease: Secondary | ICD-10-CM | POA: Diagnosis not present

## 2023-10-31 DIAGNOSIS — I10 Essential (primary) hypertension: Secondary | ICD-10-CM | POA: Diagnosis not present

## 2023-10-31 DIAGNOSIS — M4722 Other spondylosis with radiculopathy, cervical region: Secondary | ICD-10-CM | POA: Diagnosis not present

## 2023-10-31 DIAGNOSIS — I25118 Atherosclerotic heart disease of native coronary artery with other forms of angina pectoris: Secondary | ICD-10-CM | POA: Diagnosis not present

## 2023-10-31 DIAGNOSIS — R251 Tremor, unspecified: Secondary | ICD-10-CM | POA: Diagnosis not present

## 2023-10-31 DIAGNOSIS — D509 Iron deficiency anemia, unspecified: Secondary | ICD-10-CM | POA: Diagnosis not present

## 2023-10-31 DIAGNOSIS — R29898 Other symptoms and signs involving the musculoskeletal system: Secondary | ICD-10-CM | POA: Diagnosis not present

## 2023-11-12 DIAGNOSIS — R251 Tremor, unspecified: Secondary | ICD-10-CM | POA: Diagnosis not present

## 2023-11-12 DIAGNOSIS — M4722 Other spondylosis with radiculopathy, cervical region: Secondary | ICD-10-CM | POA: Diagnosis not present

## 2023-11-12 DIAGNOSIS — R29898 Other symptoms and signs involving the musculoskeletal system: Secondary | ICD-10-CM | POA: Diagnosis not present

## 2023-11-15 ENCOUNTER — Ambulatory Visit: Attending: Internal Medicine | Admitting: Internal Medicine

## 2023-11-15 ENCOUNTER — Encounter: Payer: Self-pay | Admitting: Internal Medicine

## 2023-11-15 VITALS — BP 120/76 | HR 89 | Ht 67.0 in | Wt 125.8 lb

## 2023-11-15 DIAGNOSIS — I2510 Atherosclerotic heart disease of native coronary artery without angina pectoris: Secondary | ICD-10-CM | POA: Diagnosis not present

## 2023-11-15 DIAGNOSIS — R0789 Other chest pain: Secondary | ICD-10-CM | POA: Diagnosis not present

## 2023-11-15 DIAGNOSIS — I10 Essential (primary) hypertension: Secondary | ICD-10-CM | POA: Diagnosis not present

## 2023-11-15 MED ORDER — ASPIRIN 81 MG PO TBEC: 81.0000 mg | DELAYED_RELEASE_TABLET | Freq: Every day | ORAL | 5 refills | Status: AC

## 2023-11-15 NOTE — Progress Notes (Signed)
 Cardiology Office Note  Date: 11/15/2023   ID: Shane Allison., DOB 12-Aug-1965, MRN [id number]  PCP:  Shane Doffing, FNP  Cardiologist:  None Electrophysiologist:  None   History of Present Illness: Shane Allison. is a 57 y.o. male  Referred to cardiology clinic for evaluation of imaging evidence of coronary calcifications.  Prior CT imaging reviewed, he has evidence of multivessel coronary artery calcifications.  He reports having chest pain lasting for a few seconds and that occurs occasionally.  No chest pain with exertion.  He has baseline COPD and his baseline shortness of breath but no recent worsening.  No orthopnea, PND, leg swelling.  He smokes 1 to 2 packs of cigarettes daily.  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  This is causing little stress to him and hence he is smoking more.  Does not have any claudication.  Did not have any prior MI/PCI/CABG.  Past Medical History:  Diagnosis Date   Abnormal result of iron profile testing 03/03/2016   AKI (acute kidney injury) 05/10/2015   Alcohol use    Asthma    Cervical radiculopathy    Cervical spondylosis without myelopathy 05/14/2013   Chronic neck pain    COPD (chronic obstructive pulmonary disease) (HCC)    GERD (gastroesophageal reflux disease)    GI bleed 05/10/2015   Hypertension    Joint ache    Right AC joint separation   Numbness and tingling in hands 05/14/2013   Partial tear of rotator cuff 04/02/2013   Pneumonia    Psoriasis    Radicular pain in right arm 05/14/2013   Shortness of breath dyspnea     Past Surgical History:  Procedure Laterality Date   ACROMIO-CLAVICULAR JOINT REPAIR Right 10/26/2017   Procedure: RIGHT ACROMIO-CLAVICULAR JOINT RECONSTRUCTION;  Surgeon: Addie Cordella Hamilton, MD;  Location: [facility] OR;  Service: Orthopedics;  Laterality: Right;   BIOPSY  05/12/2015   Procedure: BIOPSY;  Surgeon: Margo LITTIE Haddock, MD;  [facility];  Service: Endoscopy;;  gastric biopsy   BIOPSY  03/15/2016   Procedure: BIOPSY;  Surgeon: Margo LITTIE Haddock, MD;  [facility]: [facility];  Service: Endoscopy;;  random colon bx's   COLONOSCOPY WITH PROPOFOL  N/A 03/15/2016   Procedure: COLONOSCOPY WITH PROPOFOL ;  Surgeon: Margo LITTIE Haddock, MD;  Location: [facility];  Service: Endoscopy;  Laterality: N/A;  12:30 PM   ESOPHAGOGASTRODUODENOSCOPY (EGD) WITH PROPOFOL  N/A 05/12/2015   Dr. Haddock: normal esophagus, gastritis, bleeding friable duodenal mucosa   HAND TENDON SURGERY Right    MULTIPLE TOOTH EXTRACTIONS     POLYPECTOMY  03/15/2016   Procedure: POLYPECTOMY;  Surgeon: Margo LITTIE Haddock, MD;  Location: [facility];  Service: Endoscopy;;  rectal polyp   THUMB AMPUTATION     Partial left thumb     Current Outpatient Medications  Medication Sig Dispense Refill   acetaminophen  (TYLENOL ) 500 MG tablet Take 2,000 mg by mouth every 6 (six) hours as needed for mild pain (pain score 1-3). Takes as needed     albuterol  (VENTOLIN  HFA) 108 (90 Base) MCG/ACT inhaler Inhale 2 puffs into the lungs every 4 (four) hours as needed for wheezing or shortness of breath. 18 g 3   amlodipine -olmesartan  (AZOR ) 10-20 MG tablet TAKE ONE (1) TABLET BY MOUTH EVERY DAY 90 tablet 1   dextromethorphan  (DELSYM ) 30 MG/5ML liquid Take 5 mLs (30 mg total) by mouth 2 (two) times daily  as needed for cough. 89 mL 0   folic acid  (FOLVITE ) 1 MG tablet Take 1 tablet (1 mg total) by mouth daily. 30 tablet 1   gabapentin  (NEURONTIN ) 300 MG capsule Take 300 mg by mouth at bedtime.     ixekizumab  (TALTZ ) 80 MG/ML pen Inject 1 mL (80 mg total) into the skin every 14 (fourteen) days. Weeks 4-10. 2 mL 1   ixekizumab  (TALTZ ) 80 MG/ML pen Inject 2 mLs (160 mg total) into the skin as directed. Inject 160 mg (2 x 80 mg) subcutaneous at week 0, then begin first induction dose 80 mg (1 x 80 mg) 2 weeks later (week 2). 3 mL 0   ixekizumab  (TALTZ ) 80 MG/ML pen Inject 1 mL (80 mg total)  into the skin every 28 (twenty-eight) days. For maintenance. 1 mL 8   Multiple Vitamin (MULTIVITAMIN WITH MINERALS) TABS tablet Take 1 tablet by mouth daily.     OTEZLA  10 & 20 & 30 MG TBPK FOLLOW PACKAGE DIRECTIONS 55 each 0   oxyCODONE -acetaminophen  (PERCOCET) 10-325 MG tablet Take 1 tablet by mouth every 8 (eight) hours as needed.     predniSONE  (STERAPRED UNI-PAK 21 TAB) 10 MG (21) TBPK tablet 10 mg DS 12 as directed 48 tablet 0   thiamine  (VITAMIN B-1) 100 MG tablet Take 1 tablet (100 mg total) by mouth daily. 30 tablet 1   Tiotropium Bromide-Olodaterol (STIOLTO RESPIMAT ) 2.5-2.5 MCG/ACT AERS Inhale 2 puffs into the lungs daily. INHALE TWO PUFFS INTO THE LUNGS DAILY 4 g 3   triamcinolone  cream (KENALOG ) 0.1 % Apply 1 Application topically 2 (two) times daily. Apply for 2 weeks on, 2 weeks off until clear (alternate with calcipotriene  cream) 454 g 0   No current facility-administered medications for this visit.   Allergies:  Penicillins   Social History: The patient  reports that he has been smoking cigarettes. He has a 40 pack-year smoking history. He has never used smokeless tobacco. He reports current alcohol use. He reports that he does not use drugs.   Family History: The patient's family history includes COPD in his mother.   ROS:  Please see the history of present illness. Otherwise, complete review of systems is positive for none  All other systems are reviewed and negative.   Physical Exam: VS:  BP 120/76   Pulse 89   Ht 5' 7 (1.702 m)   Wt 125 lb 12.8 oz (57.1 kg)   SpO2 97%   BMI 19.70 kg/m , BMI Body mass index is 19.7 kg/m.  Wt Readings from Last 3 Encounters:  11/15/23 125 lb 12.8 oz (57.1 kg)  10/09/23 118 lb 1.3 oz (53.6 kg)  09/05/23 121 lb (54.9 kg)    General: Patient appears comfortable at rest. HEENT: Conjunctiva and lids normal, oropharynx clear with moist mucosa. Neck: Supple, no elevated JVP or carotid bruits, no thyromegaly. Lungs: Bilateral  wheezing Cardiac: Regular rate and rhythm, no S3 or significant systolic murmur, no pericardial rub. Abdomen: Soft, nontender, no hepatomegaly, bowel sounds present, no guarding or rebound. Extremities: No pitting edema, distal pulses 2+. Skin: Warm and dry. Musculoskeletal: No kyphosis. Neuropsychiatric: Alert and oriented x3, affect grossly appropriate.  Recent Labwork: 02/14/2023: Magnesium 2.1 02/17/2023: BUN 15; Creatinine, Ser 0.81; Hemoglobin 12.3; Platelets 374; Potassium 4.6; Sodium 135     Component Value Date/Time   CHOL 148 10/11/2022 1015   TRIG 61 10/11/2022 1015   HDL 72 10/11/2022 1015   CHOLHDL 2.1 10/11/2022 1015   LDLCALC 63 10/11/2022  1015    Assessment and Plan:  Noncardiac chest pain - Patient reports having occasional chest pain lasting for few seconds.  No chest pain with exertion.  Is noncardiac.  No further cardiac workup at this time.  Multivessel coronary calcifications - Patient has noncardiac chest pain.  He has baseline stable DOE but no recent worsening.  No orthopnea, PND, leg swelling. - Discussed symptoms of CAD and MI with the patient.  ER precautions for chest pain provided. - Start aspirin  81 mg once daily due to multiple cardiorespiratory's including managing evidence of coronary calcifications, chronic significant smoking history, primary conditions like psoriasis. - Lipid panel reviewed, normal LDL.  LDL 63 in 2024.  No indication for statin.  Nicotine  abuse -Usually smokes 1 pack/day.  But his mother was recently diagnosed with cancer and was told she only has 6 months to live.  Risk increases stress levels and hence currently smoking 2 packs/day.  Counseling provided. Smoking cessation instruction/counseling given:  counseled patient on the dangers of tobacco use, advised patient to stop smoking, and reviewed strategies to maximize success .  Patient reported that he tried these in the past and he had suicidal ideations.  Strongly encouraged  to cut back on smoking cigarettes.  He verbalized understanding.  HTN, controlled - Continue amlodipine -olmesartan  10-20 mg once daily.   40 minutes spent in review of prior records, imaging, test/labs/reports, discussion of the above problems with the patient, documentation and answering all his questions.  Medication Adjustments/Labs and Tests Ordered: Current medicines are reviewed at length with the patient today.  Concerns regarding medicines are outlined above.    Disposition:  Follow up prn  Signed Weaver Tweed Arleta Maywood, MD, 11/15/2023 3:26 PM    [facility] at [facility] [address] [place], [place], [place] [address]

## 2023-11-15 NOTE — Patient Instructions (Signed)
 Medication Instructions:  Your physician has recommended you make the following change in your medication:  Start taking Aspirin  81 mg once daily Continue taking all other medications as prescribed   Labwork: None  Testing/Procedures: None  Follow-Up: Your physician recommends that you schedule a follow-up appointment in: Follow up as needed  Any Other Special Instructions Will Be Listed Below (If Applicable). Thank you for choosing [facility]!     If you need a refill on your cardiac medications before your next appointment, please call your pharmacy.

## 2023-12-11 DIAGNOSIS — M5011 Cervical disc disorder with radiculopathy,  high cervical region: Secondary | ICD-10-CM | POA: Diagnosis not present

## 2023-12-11 DIAGNOSIS — M4722 Other spondylosis with radiculopathy, cervical region: Secondary | ICD-10-CM | POA: Diagnosis not present

## 2023-12-25 DIAGNOSIS — M4722 Other spondylosis with radiculopathy, cervical region: Secondary | ICD-10-CM | POA: Diagnosis not present

## 2023-12-25 DIAGNOSIS — M4802 Spinal stenosis, cervical region: Secondary | ICD-10-CM | POA: Diagnosis not present

## 2023-12-25 DIAGNOSIS — M5030 Other cervical disc degeneration, unspecified cervical region: Secondary | ICD-10-CM | POA: Diagnosis not present

## 2024-01-26 DIAGNOSIS — F1090 Alcohol use, unspecified, uncomplicated: Secondary | ICD-10-CM | POA: Diagnosis not present

## 2024-01-26 DIAGNOSIS — M4722 Other spondylosis with radiculopathy, cervical region: Secondary | ICD-10-CM | POA: Diagnosis not present

## 2024-01-26 DIAGNOSIS — E782 Mixed hyperlipidemia: Secondary | ICD-10-CM | POA: Diagnosis not present

## 2024-01-26 DIAGNOSIS — G629 Polyneuropathy, unspecified: Secondary | ICD-10-CM | POA: Diagnosis not present

## 2024-01-26 DIAGNOSIS — R251 Tremor, unspecified: Secondary | ICD-10-CM | POA: Diagnosis not present

## 2024-01-26 DIAGNOSIS — Z125 Encounter for screening for malignant neoplasm of prostate: Secondary | ICD-10-CM | POA: Diagnosis not present

## 2024-01-26 DIAGNOSIS — Z79899 Other long term (current) drug therapy: Secondary | ICD-10-CM | POA: Diagnosis not present

## 2024-01-26 DIAGNOSIS — E538 Deficiency of other specified B group vitamins: Secondary | ICD-10-CM | POA: Diagnosis not present
